# Patient Record
Sex: Female | Born: 1960 | Race: White | Hispanic: No | Smoking: Current some day smoker
Health system: Southern US, Community
[De-identification: ages and names within clinical notes are randomized; demographics above are authoritative.]

## PROBLEM LIST (undated history)

## (undated) DIAGNOSIS — T783XXA Angioneurotic edema, initial encounter: Secondary | ICD-10-CM

## (undated) DIAGNOSIS — L509 Urticaria, unspecified: Secondary | ICD-10-CM

## (undated) DIAGNOSIS — R45851 Suicidal ideations: Secondary | ICD-10-CM

## (undated) DIAGNOSIS — E538 Deficiency of other specified B group vitamins: Secondary | ICD-10-CM

## (undated) DIAGNOSIS — L309 Dermatitis, unspecified: Secondary | ICD-10-CM

## (undated) DIAGNOSIS — G5600 Carpal tunnel syndrome, unspecified upper limb: Secondary | ICD-10-CM

## (undated) DIAGNOSIS — H6980 Other specified disorders of Eustachian tube, unspecified ear: Secondary | ICD-10-CM

## (undated) DIAGNOSIS — F1020 Alcohol dependence, uncomplicated: Secondary | ICD-10-CM

## (undated) DIAGNOSIS — F988 Other specified behavioral and emotional disorders with onset usually occurring in childhood and adolescence: Secondary | ICD-10-CM

## (undated) DIAGNOSIS — Z872 Personal history of diseases of the skin and subcutaneous tissue: Secondary | ICD-10-CM

## (undated) DIAGNOSIS — L819 Disorder of pigmentation, unspecified: Secondary | ICD-10-CM

## (undated) DIAGNOSIS — M20012 Mallet finger of left finger(s): Secondary | ICD-10-CM

## (undated) HISTORY — DX: Urticaria, unspecified: L50.9

## (undated) HISTORY — DX: Deficiency of other specified B group vitamins: E53.8

## (undated) HISTORY — DX: Other specified behavioral and emotional disorders with onset usually occurring in childhood and adolescence: F98.8

## (undated) HISTORY — DX: Carpal tunnel syndrome, unspecified upper limb: G56.00

## (undated) HISTORY — DX: Suicidal ideations: R45.851

## (undated) HISTORY — PX: FOOT SURGERY: SHX648

## (undated) HISTORY — PX: RHINOPLASTY: SUR1284

## (undated) HISTORY — DX: Mallet finger of left finger(s): M20.012

## (undated) HISTORY — DX: Dermatitis, unspecified: L30.9

## (undated) HISTORY — DX: Personal history of diseases of the skin and subcutaneous tissue: Z87.2

## (undated) HISTORY — DX: Angioneurotic edema, initial encounter: T78.3XXA

## (undated) HISTORY — DX: Disorder of pigmentation, unspecified: L81.9

## (undated) HISTORY — DX: Other specified disorders of Eustachian tube, unspecified ear: H69.80

## (undated) HISTORY — PX: TONSILLECTOMY: SUR1361

---

## 1998-04-19 ENCOUNTER — Other Ambulatory Visit: Admission: RE | Admit: 1998-04-19 | Discharge: 1998-04-19 | Payer: Self-pay | Admitting: Obstetrics and Gynecology

## 1998-06-01 ENCOUNTER — Inpatient Hospital Stay (HOSPITAL_COMMUNITY): Admission: AD | Admit: 1998-06-01 | Discharge: 1998-06-01 | Payer: Self-pay | Admitting: *Deleted

## 1998-06-06 ENCOUNTER — Observation Stay (HOSPITAL_COMMUNITY): Admission: AD | Admit: 1998-06-06 | Discharge: 1998-06-07 | Payer: Self-pay | Admitting: *Deleted

## 1998-10-02 ENCOUNTER — Inpatient Hospital Stay (HOSPITAL_COMMUNITY): Admission: AD | Admit: 1998-10-02 | Discharge: 1998-10-04 | Payer: Self-pay | Admitting: Obstetrics and Gynecology

## 1998-10-03 ENCOUNTER — Encounter (HOSPITAL_COMMUNITY): Admission: RE | Admit: 1998-10-03 | Discharge: 1999-01-01 | Payer: Self-pay | Admitting: Obstetrics and Gynecology

## 1998-11-13 ENCOUNTER — Other Ambulatory Visit: Admission: RE | Admit: 1998-11-13 | Discharge: 1998-11-13 | Payer: Self-pay | Admitting: Obstetrics and Gynecology

## 1999-11-13 ENCOUNTER — Other Ambulatory Visit: Admission: RE | Admit: 1999-11-13 | Discharge: 1999-11-13 | Payer: Self-pay | Admitting: Obstetrics and Gynecology

## 2000-07-29 ENCOUNTER — Other Ambulatory Visit: Admission: RE | Admit: 2000-07-29 | Discharge: 2000-07-29 | Payer: Self-pay | Admitting: Obstetrics and Gynecology

## 2000-08-01 ENCOUNTER — Other Ambulatory Visit: Admission: RE | Admit: 2000-08-01 | Discharge: 2000-08-01 | Payer: Self-pay | Admitting: Obstetrics and Gynecology

## 2000-08-01 ENCOUNTER — Encounter (INDEPENDENT_AMBULATORY_CARE_PROVIDER_SITE_OTHER): Payer: Self-pay | Admitting: Specialist

## 2001-02-16 ENCOUNTER — Encounter (INDEPENDENT_AMBULATORY_CARE_PROVIDER_SITE_OTHER): Payer: Self-pay

## 2001-02-16 ENCOUNTER — Other Ambulatory Visit: Admission: RE | Admit: 2001-02-16 | Discharge: 2001-02-16 | Payer: Self-pay | Admitting: *Deleted

## 2002-12-13 ENCOUNTER — Encounter: Admission: RE | Admit: 2002-12-13 | Discharge: 2002-12-13 | Payer: Self-pay | Admitting: Internal Medicine

## 2002-12-13 ENCOUNTER — Encounter: Payer: Self-pay | Admitting: Internal Medicine

## 2004-04-19 ENCOUNTER — Encounter: Admission: RE | Admit: 2004-04-19 | Discharge: 2004-04-19 | Payer: Self-pay | Admitting: Obstetrics and Gynecology

## 2005-05-21 ENCOUNTER — Encounter: Admission: RE | Admit: 2005-05-21 | Discharge: 2005-05-21 | Payer: Self-pay | Admitting: Obstetrics and Gynecology

## 2006-05-22 ENCOUNTER — Encounter: Admission: RE | Admit: 2006-05-22 | Discharge: 2006-05-22 | Payer: Self-pay | Admitting: Obstetrics and Gynecology

## 2006-07-22 LAB — CONVERTED CEMR LAB: Pap Smear: NORMAL

## 2007-02-19 ENCOUNTER — Encounter: Payer: Self-pay | Admitting: Internal Medicine

## 2007-04-15 ENCOUNTER — Ambulatory Visit: Payer: Self-pay | Admitting: Internal Medicine

## 2007-04-15 DIAGNOSIS — Z872 Personal history of diseases of the skin and subcutaneous tissue: Secondary | ICD-10-CM | POA: Insufficient documentation

## 2007-04-15 DIAGNOSIS — E538 Deficiency of other specified B group vitamins: Secondary | ICD-10-CM | POA: Insufficient documentation

## 2007-04-15 HISTORY — DX: Personal history of diseases of the skin and subcutaneous tissue: Z87.2

## 2007-04-15 LAB — CONVERTED CEMR LAB
Bilirubin Urine: NEGATIVE
Ketones, urine, test strip: NEGATIVE
Nitrite: NEGATIVE
RBC / HPF: NONE SEEN (ref <3)
Specific Gravity, Urine: 1.01
Urobilinogen, UA: NEGATIVE
WBC, UA: NONE SEEN cells/hpf (ref <3)
pH: 6

## 2007-04-16 ENCOUNTER — Encounter: Payer: Self-pay | Admitting: Internal Medicine

## 2007-05-04 ENCOUNTER — Encounter: Payer: Self-pay | Admitting: Family Medicine

## 2007-05-05 ENCOUNTER — Encounter: Payer: Self-pay | Admitting: Internal Medicine

## 2007-05-28 ENCOUNTER — Encounter: Admission: RE | Admit: 2007-05-28 | Discharge: 2007-05-28 | Payer: Self-pay | Admitting: Obstetrics and Gynecology

## 2007-08-04 ENCOUNTER — Ambulatory Visit: Payer: Self-pay | Admitting: Internal Medicine

## 2007-08-06 ENCOUNTER — Telehealth (INDEPENDENT_AMBULATORY_CARE_PROVIDER_SITE_OTHER): Payer: Self-pay | Admitting: *Deleted

## 2007-08-10 LAB — CONVERTED CEMR LAB
HDL: 60.5 mg/dL (ref >39.0)
TSH: 3.59 microintl units/mL (ref 0.35–5.50)
VLDL: 9 mg/dL (ref 0–40)

## 2007-08-11 ENCOUNTER — Telehealth (INDEPENDENT_AMBULATORY_CARE_PROVIDER_SITE_OTHER): Payer: Self-pay | Admitting: *Deleted

## 2008-11-01 ENCOUNTER — Encounter: Admission: RE | Admit: 2008-11-01 | Discharge: 2008-11-01 | Payer: Self-pay | Admitting: Obstetrics and Gynecology

## 2009-09-19 LAB — CONVERTED CEMR LAB: Pap Smear: NORMAL

## 2009-11-07 ENCOUNTER — Encounter: Admission: RE | Admit: 2009-11-07 | Discharge: 2009-11-07 | Payer: Self-pay | Admitting: Obstetrics and Gynecology

## 2009-12-01 ENCOUNTER — Ambulatory Visit: Payer: Self-pay | Admitting: Internal Medicine

## 2009-12-01 DIAGNOSIS — G5600 Carpal tunnel syndrome, unspecified upper limb: Secondary | ICD-10-CM

## 2009-12-01 DIAGNOSIS — R109 Unspecified abdominal pain: Secondary | ICD-10-CM | POA: Insufficient documentation

## 2009-12-01 HISTORY — DX: Carpal tunnel syndrome, unspecified upper limb: G56.00

## 2009-12-01 LAB — CONVERTED CEMR LAB
Bilirubin Urine: NEGATIVE
Glucose, Urine, Semiquant: NEGATIVE
Ketones, urine, test strip: NEGATIVE
Urobilinogen, UA: 0.2
pH: 5

## 2009-12-13 ENCOUNTER — Ambulatory Visit: Payer: Self-pay | Admitting: Internal Medicine

## 2009-12-15 LAB — CONVERTED CEMR LAB
ALT: 18 units/L (ref 0–35)
Albumin: 4.1 g/dL (ref 3.5–5.2)
BUN: 9 mg/dL (ref 6–23)
Basophils Relative: 1.2 % (ref 0.0–3.0)
CO2: 27 meq/L (ref 19–32)
Calcium: 9.2 mg/dL (ref 8.4–10.5)
Cholesterol: 137 mg/dL (ref 0–200)
Creatinine, Ser: 0.7 mg/dL (ref 0.4–1.2)
Eosinophils Absolute: 0.3 10*3/uL (ref 0.0–0.7)
Eosinophils Relative: 6.4 % — ABNORMAL HIGH (ref 0.0–5.0)
Folate: 16 ng/mL
Glucose, Bld: 70 mg/dL (ref 70–99)
HDL: 63.6 mg/dL (ref >39.00)
Lymphocytes Relative: 37.3 % (ref 12.0–46.0)
MCHC: 34.1 g/dL (ref 30.0–36.0)
Neutrophils Relative %: 43 % (ref 43.0–77.0)
Platelets: 285 10*3/uL (ref 150.0–400.0)
RBC: 4.04 M/uL (ref 3.87–5.11)
Total Protein: 6.8 g/dL (ref 6.0–8.3)
Triglycerides: 48 mg/dL (ref 0.0–149.0)
Vitamin B-12: 702 pg/mL (ref 211–911)
WBC: 4.3 10*3/uL — ABNORMAL LOW (ref 4.5–10.5)

## 2010-08-12 ENCOUNTER — Encounter: Payer: Self-pay | Admitting: Internal Medicine

## 2010-08-16 ENCOUNTER — Ambulatory Visit
Admission: RE | Admit: 2010-08-16 | Discharge: 2010-08-16 | Payer: Self-pay | Source: Home / Self Care | Attending: Internal Medicine | Admitting: Internal Medicine

## 2010-08-16 ENCOUNTER — Other Ambulatory Visit: Payer: Self-pay | Admitting: Internal Medicine

## 2010-08-16 DIAGNOSIS — K921 Melena: Secondary | ICD-10-CM | POA: Insufficient documentation

## 2010-08-16 LAB — CONVERTED CEMR LAB
Blood in Urine, dipstick: NEGATIVE
Glucose, Urine, Semiquant: NEGATIVE
Nitrite: NEGATIVE
Protein, U semiquant: NEGATIVE
Specific Gravity, Urine: 1.01
pH: 6.5

## 2010-08-16 LAB — CBC WITH DIFFERENTIAL/PLATELET
Basophils Absolute: 0 10*3/uL (ref 0.0–0.1)
Basophils Relative: 0.6 % (ref 0.0–3.0)
Eosinophils Absolute: 0.2 10*3/uL (ref 0.0–0.7)
HCT: 38.4 % (ref 36.0–46.0)
Lymphocytes Relative: 38.7 % (ref 12.0–46.0)
MCHC: 34.2 g/dL (ref 30.0–36.0)
MCV: 95.7 fl (ref 78.0–100.0)
RBC: 4.01 Mil/uL (ref 3.87–5.11)
WBC: 6.3 10*3/uL (ref 4.5–10.5)

## 2010-08-17 ENCOUNTER — Encounter: Payer: Self-pay | Admitting: Gastroenterology

## 2010-08-17 ENCOUNTER — Encounter: Payer: Self-pay | Admitting: Internal Medicine

## 2010-08-17 LAB — CONVERTED CEMR LAB

## 2010-08-21 NOTE — Assessment & Plan Note (Signed)
Summary: CPX/KDC   Vital Signs:  Patient profile:   50 year old female Height:      66 inches Weight:      125.8 pounds BMI:     20.38 Pulse rate:   92 / minute BP sitting:   124 / 80  Vitals Entered By: Shary Decamp (Dec 01, 2009 1:26 PM) CC: cpx, not fasting Is Patient Diabetic? No   History of Present Illness: CPX also complaining of ill-defined lower abdominal discomfort, symptoms are not related to her menstrual periods or food intake  The discomfort is bilateral, lower, sometimes in the right flank and is going on for 2 years. She lost her mom to an abdominal cancer last year and is concerned.  they never found a  primary cancer of her mom Last gynecological examination  few  month ago, normal.  Review of systems Denies nausea, vomiting, diarrhea Occasionally he has a small amount of blood per rectum associated with pain, patient thinks is due  to an anal fissure No dysuria or hematuria.  Also almost 2 years ago she was seen at the ER and later on at  the neurology office with right hand weakness and numbness A number of tests were done including a nerve conduction study The neurologist told her that she has right carpal  tunnel syndrome and a B12 deficiency, she has been taking B12 supplements since At the present time she continued to have weakness in the right hand and occasional numbness mostly in the first and second finger.  Also from time to time the tip of her fingers gets really plae , usually with exposure to cold. No episode of redness or blushing of her fingers.   Preventive Screening-Counseling & Management  Alcohol-Tobacco     Alcohol type: beer, wine     Packs/Day: 1 pack/week  Caffeine-Diet-Exercise     Caffeine use/day: 1     Does Patient Exercise: no  Allergies: No Known Drug Allergies  Past History:  Past Medical History: G5 P3 miscarriage x 2 B12 def not on BC , was pregnat 2010, spontaneous loss   Past Surgical History: Reviewed  history from 04/15/2007 and no changes required. Tonsillectomy Rhinoplasty  Family History: MI: M at age 44 Family History of CAD Female 1st degree relative <60 no colon cancer or breast cancer two brothers have a h/o substance abuse M - deceased Ca?kind DM - 2 bro HTN - GM, bro  Social History: Reviewed history from 04/15/2007 and no changes required. 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 Resp:  Denies cough and shortness of breath. MS:  no neck pain. Psych:  ++  stress at work .  Physical Exam  General:  alert, well-developed, and well-nourished.   Neck:  no thyromegaly and normal carotid upstroke.   Lungs:  normal respiratory effort, no intercostal retractions, no accessory muscle use, and normal breath sounds.   Heart:  normal rate, regular rhythm, no murmur, and no gallop.   Abdomen:  soft, non-tender, no distention, no masses, no guarding, and no rigidity.  palpable aorta in the epigastrium, no bruit or tenderness Msk:  no upper extremity subcutaneous nodules Extremities:  no edema Inspection and palpation of the wrists and hands is normal, no  synovitis Good capillary refill bilaterally Neurologic:  opportunity reflexes symmetric Upper extremity motor intact Pinprick examination of the upper extremities is also normal   Impression & Recommendations:  Problem # 1:  HEALTH SCREENING (ICD-V70.0) Td today never had a Cscope  drinks daily, moderation recommended, counseled , call if needs help  tobacco-- counseled female cared by gynecology, last female exam 3-11 labs  Problem # 2:  CARPAL TUNNEL SYNDROME (ICD-354.0) 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 See HPI recommend to see  orthopedic surgery for reevaluation, if she has transient hand weakness, she may benefit from surgery addendum-- prefers to go back to the neurologist that saw her , she will make her appointment   Problem # 3:  ABDOMINAL PAIN (ICD-789.00)  ill-defined abdominal pain Labs to rule out anemia Ultrasound of the abdomen--pelvis Consider a colonoscopy Reassess in 4 months  Orders: UA Dipstick w/Micro (automated) (81001) Radiology Referral (Radiology)  Problem # 4:  change in the color of the fingertips: Observe   Problem # 5:  time spent in addicition to her CPX >> 15 min   Complete Medication List: 1)  B12   Patient Instructions: 1)  came  back fasting: 2)  FLP, BMP, LFTs, TSH ------ dx v70 3)  CBC ---- dx abdominal pain 4)  B2 of folic acid ----- dx  B12 deficiency 5)  Please schedule a follow-up appointment in 4 months .  6)  It is not healthy for a women to drink more then 1-2 drinks per day.    Risk Factors:  Tobacco use:  current    Cigarettes:  Yes -- 1 pack/week pack(s) per day Drug use:  no Caffeine use:  1 drinks per day Alcohol use:  yes    Type:  beer, wine    Drinks per day:  4+ Exercise:  no  Mammogram History:     Date of Last Mammogram:  10/20/2009    Results:  normal   PAP Smear History:     Date of Last PAP Smear:  09/19/2009    Results:  normal     Preventive Care Screening  Last Tetanus Booster:    Date:  12/01/2009    Results:  pt declined Td 12/01/09  Mammogram:    Date:  10/20/2009    Results:  normal   Pap Smear:    Date:  09/19/2009    Results:  normal      Laboratory Results   Urine Tests    Routine Urinalysis   Glucose: negative   (Normal Range: Negative) Bilirubin: negative   (Normal Range: Negative) Ketone: negative   (Normal Range: Negative) Spec. Gravity: 1.015   (Normal Range: 1.003-1.035) Blood: negative   (Normal Range: Negative) pH: 5.0   (Normal Range: 5.0-8.0) Protein: negative   (Normal Range:  Negative) Urobilinogen: 0.2   (Normal Range: 0-1) Nitrite: negative   (Normal Range: Negative) Leukocyte Esterace: negative   (Normal Range: Negative)

## 2010-08-23 NOTE — Assessment & Plan Note (Signed)
Summary: bleeding when having movements///sph   Vital Signs:  Patient profile:   50 year old female Height:      66 inches (167.64 cm) Weight:      129 pounds (58.64 kg) BMI:     20.90 Temp:     98.4 degrees F (36.89 degrees C) oral BP sitting:   110 / 76  (left arm) Cuff size:   regular  Vitals Entered By: Lucious Groves CMA (August 16, 2010 8:44 AM) CC: C/O bright red rectal bleeding with BM./kb Is Patient Diabetic? No Pain Assessment Patient in pain? no      Comments Patient notes that she has been having soft almost diarrhea like BM's, and abd pain. She denies any pain at this time, fever, weakness, dizziness, and constipation.   History of Present Illness: on and off history of blood per rectum, she frequently has red blood in the toilet paper and in the toilet; unable to say if the blood is mixed with the stools; during  September and December 2011, symptoms were much frecuent  but she is better now. She still has some anorectal irritation. She has a long history of frequent BMs however in the last few months they've been even  more frequent up to 5 times a day. Her stools are usually soft but they are even softer lately.  ROS She continued to have ill-defined lower abdominal pain for about 2 years, she was evaluated 11-2009, recommend an ultrasound but that never happened. Denies nausea, vomiting. No weight loss (she has gained 3 pounds in the last few months) No fever thinks she "may be getting a UTI", but denies actual dysuria or hematuria.      Current Medications (verified): 1)  B12 2)  Fish Oil .... Qd 3)  Mvi .... Qd 4)  Probiotic .... Once Daily  Allergies (verified): No Known Drug Allergies  Past History:  Past Medical History: Reviewed history from 12/01/2009 and no changes required. G5 P3 miscarriage x 2 B12 def not on BC , was pregnat 2010, spontaneous loss   Past Surgical History: Reviewed history from 04/15/2007 and no changes  required. Tonsillectomy Rhinoplasty  Social History: Reviewed history from 12/01/2009 and no changes required. 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   Eyes:  not pale or jaundice  Abdomen:  soft, no distention, no masses, no guarding, and no rigidity.  mild lower abdominal discomfort to palpation. No CVA tenderness Rectal:  No external abnormalities noted. Normal sphincter tone. No rectal masses or tenderness. stools Hemoccult negative anoscopy:several, small  Internal hemorrhoids, rectal mucosa slightly red. No mucus. Extremities:  no pretibial edema bilaterally  Psych:  Oriented X3, normally interactive, good eye contact, not anxious appearing, and not depressed appearing.     Impression & Recommendations:  Problem # 1:  ABDOMINAL PAIN (ICD-789.00) ongoing lower abdominal pain, frequent hematochezia, bowel movement pattern recently changed. Last labs showed no anemia. Plan: CBC UA GI referral, likely she will need a colonoscopy due to the above symptoms. CAT Scan?  Orders: Venipuncture ([address]) TLB-CBC Platelet - w/Differential (85025-CBCD) Specimen Handling (60454) T-Culture, Urine (09811-91478) UA Dipstick w/o Micro (manual) (29562) Gastroenterology Referral (GI)  Problem # 2:  HEMATOCHEZIA (ICD-578.1) Assessment: New  see #1  Orders: Gastroenterology Referral (GI) Anoscopy (13086)  Complete Medication List: 1)  B12  2)  Fish Oil  .Marland KitchenMarland KitchenMarland Kitchen  Qd 3)  Mvi  .... Qd 4)  Probiotic  .... Once daily  Patient Instructions: 1)  Please schedule a follow-up appointment in 4 months . physical exam   Orders Added: 1)  Venipuncture [36415] 2)  TLB-CBC Platelet - w/Differential [85025-CBCD] 3)  Specimen Handling [99000] 4)  T-Culture, Urine [[address]] 5)  UA Dipstick w/o Micro (manual)  [81002] 6)  Gastroenterology Referral [GI] 7)  Est. Patient Level IV [09811] 8)  Anoscopy [46600]    Laboratory Results   Urine Tests  Date/Time Received: Lucious Groves [facility]  August 16, 2010 9:10 AM  Date/Time Reported: Lucious Groves CMA  August 16, 2010 9:10 AM   Routine Urinalysis   Color: lt. yellow Appearance: Hazy Glucose: negative   (Normal Range: Negative) Bilirubin: negative   (Normal Range: Negative) Ketone: negative   (Normal Range: Negative) Spec. Gravity: 1.010   (Normal Range: 1.003-1.035) Blood: negative   (Normal Range: Negative) pH: 6.5   (Normal Range: 5.0-8.0) Protein: negative   (Normal Range: Negative) Urobilinogen: 0.2   (Normal Range: 0-1) Nitrite: negative   (Normal Range: Negative) Leukocyte Esterace: trace   (Normal Range: Negative)

## 2010-08-23 NOTE — Letter (Signed)
Summary: New Patient letter  [facility]  [address] [place], [place] [address]   Phone: [phone]  Fax: [phone]       08/17/2010 MRN: [id number]  Leah Jones [address] [place], [place]  [address]  Dear Leah Jones,  Welcome to the Gastroenterology Division at [facility].    You are scheduled to see Dr.  Arlyce Dice on 08-27-10 at 10am on the 3rd floor at [facility], [address]  We ask that you try to arrive at our office 15 minutes prior to your appointment time to allow for check-in.  We would like you to complete the enclosed self-administered evaluation form prior to your visit and bring it with you on the day of your appointment.  We will review it with you.  Also, please bring a complete list of all your medications or, if you prefer, bring the medication bottles and we will list them.  Please bring your insurance card so that we may make a copy of it.  If your insurance requires a referral to see a specialist, please bring your referral form from your primary care physician.  Co-payments are due at the time of your visit and may be paid by cash, check or credit card.     Your office visit will consist of a consult with your physician (includes a physical exam), any laboratory testing he/she may order, scheduling of any necessary diagnostic testing (e.g. x-ray, ultrasound, CT-scan), and scheduling of a procedure (e.g. Endoscopy, Colonoscopy) if required.  Please allow enough time on your schedule to allow for any/all of these possibilities.    If you cannot keep your appointment, please call [phone] to cancel or reschedule prior to your appointment date.  This allows [place] the opportunity to schedule an appointment for another patient in need of care.  If you do not cancel or reschedule by 5 p.m. the business day prior to your appointment date, you will be charged a $50.00 late cancellation/no-show fee.    Thank you for choosing [facility] for your medical needs.  We appreciate the opportunity to care for you.  Please visit [place] at our website  to learn more about our practice.                     Sincerely,                                                             The Gastroenterology Division

## 2010-08-27 ENCOUNTER — Encounter: Payer: Self-pay | Admitting: Gastroenterology

## 2010-08-27 ENCOUNTER — Encounter: Payer: Self-pay | Admitting: Internal Medicine

## 2010-08-27 ENCOUNTER — Ambulatory Visit (INDEPENDENT_AMBULATORY_CARE_PROVIDER_SITE_OTHER): Payer: Managed Care, Other (non HMO) | Admitting: Gastroenterology

## 2010-08-27 ENCOUNTER — Other Ambulatory Visit: Payer: Managed Care, Other (non HMO)

## 2010-08-27 ENCOUNTER — Other Ambulatory Visit: Payer: Self-pay | Admitting: Gastroenterology

## 2010-08-27 DIAGNOSIS — K602 Anal fissure, unspecified: Secondary | ICD-10-CM | POA: Insufficient documentation

## 2010-08-27 DIAGNOSIS — R109 Unspecified abdominal pain: Secondary | ICD-10-CM

## 2010-08-27 DIAGNOSIS — R1011 Right upper quadrant pain: Secondary | ICD-10-CM

## 2010-08-27 DIAGNOSIS — K625 Hemorrhage of anus and rectum: Secondary | ICD-10-CM

## 2010-08-27 LAB — HEPATIC FUNCTION PANEL
ALT: 19 U/L (ref 0–35)
AST: 21 U/L (ref 0–37)
Alkaline Phosphatase: 36 U/L — ABNORMAL LOW (ref 39–117)
Bilirubin, Direct: 0.1 mg/dL (ref 0.0–0.3)
Total Bilirubin: 0.7 mg/dL (ref 0.3–1.2)

## 2010-08-27 LAB — CONVERTED CEMR LAB: Tissue Transglutaminase Ab, IgA: 4.3 units (ref <20)

## 2010-09-03 ENCOUNTER — Other Ambulatory Visit (HOSPITAL_COMMUNITY): Payer: Managed Care, Other (non HMO)

## 2010-09-06 NOTE — Letter (Signed)
Summary: Results Letter  [facility]  [address] [place], [place] [address]   Phone: [phone]  Fax: [phone]        August 27, 2010 MRN: [id number]    POLLY BARNER [address] [place], [place]  [address]    Dear Ms. TRANSUE,  It is my pleasure to have treated you recently as a new patient in my office. I appreciate your confidence and the opportunity to participate in your care.  Since I do have a busy inpatient endoscopy schedule and office schedule, my office hours vary weekly. I am, however, available for emergency calls everyday through my office. If I am not available for an urgent office appointment, another one of our gastroenterologist will be able to assist you.  My well-trained staff are prepared to help you at all times. For emergencies after office hours, a physician from our Gastroenterology section is always available through my 24 hour answering service  Once again I welcome you as a new patient and I look forward to a happy and healthy relationship             Sincerely,  Louis Meckel MD  This letter has been electronically signed by your physician.  Appended Document: Results Letter letter mailed

## 2010-09-06 NOTE — Letter (Signed)
Summary: [facility] Instructions  [facility]  [address] [place], [place] [address]   Phone: [phone]  Fax: [phone]       MIIA BLANKS    03/31/1961    MRN: [id number]        Procedure Day /Date:THURSDAY 09/20/2010     Arrival Time:10AM     Procedure Time:11AM     Location of Procedure:                    X  [facility] (4th Floor)  PREPARATION FOR COLONOSCOPY WITH MOVIPREP   Starting 5 days prior to your procedure 09/15/2010 do not eat nuts, seeds, popcorn, corn, beans, peas,  salads, or any raw vegetables.  Do not take any fiber supplements (e.g. Metamucil, Citrucel, and Benefiber).  THE DAY BEFORE YOUR PROCEDURE         DATE:09/19/2010   DAY: WEDNESDAY  1.  Drink clear liquids the entire day-NO SOLID FOOD  2.  Do not drink anything colored red or purple.  Avoid juices with pulp.  No orange juice.  3.  Drink at least 64 oz. (8 glasses) of fluid/clear liquids during the day to prevent dehydration and help the prep work efficiently.  CLEAR LIQUIDS INCLUDE: Water Jello Ice Popsicles Tea (sugar ok, no milk/cream) Powdered fruit flavored drinks Coffee (sugar ok, no milk/cream) Gatorade Juice: apple, white grape, white cranberry  Lemonade Clear bullion, consomm, broth Carbonated beverages (any kind) Strained chicken noodle soup Hard Candy                             4.  In the morning, mix first dose of MoviPrep solution:    Empty 1 Pouch A and 1 Pouch B into the disposable container    Add lukewarm drinking water to the top line of the container. Mix to dissolve    Refrigerate (mixed solution should be used within 24 hrs)  5.  Begin drinking the prep at 5:00 p.m. The MoviPrep container is divided by 4 marks.   Every 15 minutes drink the solution down to the next mark (approximately 8 oz) until the full liter is complete.   6.  Follow completed prep with 16 oz of clear liquid of your choice (Nothing red or purple).  Continue to  drink clear liquids until bedtime.  7.  Before going to bed, mix second dose of MoviPrep solution:    Empty 1 Pouch A and 1 Pouch B into the disposable container    Add lukewarm drinking water to the top line of the container. Mix to dissolve    Refrigerate  THE DAY OF YOUR PROCEDURE      DATE: 09/20/2010  DAY: THURSDAY  Beginning at 6a.m. (5 hours before procedure):         1. Every 15 minutes, drink the solution down to the next mark (approx 8 oz) until the full liter is complete.  2. Follow completed prep with 16 oz. of clear liquid of your choice.    3. You may drink clear liquids until9AM  (2 HOURS BEFORE PROCEDURE).   MEDICATION INSTRUCTIONS  Unless otherwise instructed, you should take regular prescription medications with a small sip of water   as early as possible the morning of your procedure.        OTHER INSTRUCTIONS  You will need a responsible adult at least 48 years of age to accompany you and drive  you home.   This person must remain in the waiting room during your procedure.  Wear loose fitting clothing that is easily removed.  Leave jewelry and other valuables at home.  However, you may wish to bring a book to read or  an iPod/MP3 player to listen to music as you wait for your procedure to start.  Remove all body piercing jewelry and leave at home.  Total time from sign-in until discharge is approximately 2-3 hours.  You should go home directly after your procedure and rest.  You can resume normal activities the  day after your procedure.  The day of your procedure you should not:   Drive   Make legal decisions   Operate machinery   Drink alcohol   Return to work  You will receive specific instructions about eating, activities and medications before you leave.    The above instructions have been reviewed and explained to me by   _______________________    I fully understand and can verbalize these instructions  _____________________________ Date _________

## 2010-09-06 NOTE — Assessment & Plan Note (Addendum)
Summary: Abd pain and rectal bleeding    History of Present Illness Visit Type: Initial Consult Primary GI MD: Melvia Heaps MD [facility] Primary Provider: Willow Ora, MD Requesting Provider: Willow Ora, MD Chief Complaint: Lower abd pain, BRB after BMs and rectal pain  History of Present Illness:   Leah Jones is a pleasant 50 year old white female referred at the request of Dr. Drue Novel for evaluation of rectal bleeding.  She complains of bright red blood mixed with her stools and discoloring the toilet water.  She has a history of an anal fissure.   She normally moves her bowels 5-6 times a day.  Mother apparently had celiac disease.  No recent change in bowel habits.  She denies melena.  She is also complaining of occasional midepigastric and right upper quadrant discomfort.   GI Review of Systems    Reports abdominal pain and  bloating.     Location of  Abdominal pain: lower abdomen.    Denies acid reflux, belching, chest pain, dysphagia with liquids, dysphagia with solids, heartburn, loss of appetite, nausea, vomiting, vomiting blood, weight loss, and  weight gain.      Reports rectal bleeding and  rectal pain.     Denies anal fissure, black tarry stools, change in bowel habit, constipation, diarrhea, diverticulosis, fecal incontinence, heme positive stool, hemorrhoids, irritable bowel syndrome, jaundice, light color stool, and  liver problems.    Current Medications (verified): 1)  Fish Oil 1000 Mg Caps (Omega-3 Fatty Acids) .... One Capsule By Mouth Once Daily 2)  Multivitamins   Tabs (Multiple Vitamin) .... One Tablet By Mouth Once Daily 3)  Probiotic  Caps (Probiotic Product) .... One Capsule By Mouth Once Daily  Allergies (verified): No Known Drug Allergies  Past History:  Past Medical History: G5 P3 miscarriage x 2 not on BC , was pregnat 2010, spontaneous loss  ANAL FISSURE (ICD-565.0) HEMATOCHEZIA (ICD-578.1) ABDOMINAL PAIN (ICD-789.00) CARPAL TUNNEL SYNDROME (ICD-354.0) HEALTH  SCREENING (ICD-V70.0) ANAL FISSURE, HX OF (ICD-V13.3) FAMILY HISTORY OF CAD FEMALE 1ST DEGREE RELATIVE <60 (ICD-V16.49) B12 DEFICIENCY (ICD-266.2)  Past Surgical History: Reviewed history from 04/15/2007 and no changes required. Tonsillectomy Rhinoplasty  Family History: MI: M at age 19 Family History of CAD Female 1st degree relative <60 two brothers have a h/o substance abuse DM - 2 bro HTN - GM, bro Family History of Colon Cancer: ? Mother Family History of Breast Cancer:? 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  The patient denies allergy/sinus, anemia, anxiety-new, arthritis/joint pain, back pain, blood in urine, breast changes/lumps, change in vision, confusion, cough, coughing up blood, depression-new, fainting, fever, headaches-new, hearing problems, heart murmur, heart rhythm changes, itching, menstrual pain, muscle pains/cramps, nosebleeds, pregnancy symptoms, shortness of breath, skin rash, sleeping problems, sore throat, swelling of feet/legs, swollen lymph glands, thirst - excessive , urination - excessive , urination changes/pain, urine leakage, vision changes, and voice change.         All other systems were reviewed and were negative   Vital Signs:  Patient profile:   50 year old female Height:      66 inches Weight:  128 pounds BMI:     20.73 BSA:     1.66 Pulse rate:   88 / minute Pulse rhythm:   regular BP sitting:   120 / 64  (left arm) Cuff size:   regular  Vitals Entered By: Ok Anis CMA (August 27, 2010 10:12 AM)  Physical Exam  Additional Exam:  On physical exam she is well-developed well-nourished  female  skin: anicteric HEENT: normocephalic; PEERLA; no nasal or pharyngeal abnormalities neck: supple nodes: no cervical lymphadenopathy chest: clear to ausculatation and percussion heart: no murmurs, gallops, or rubs abd: soft, nontender; BS normoactive; no abdominal masses, tenderness, organomegaly rectal: no masses ext: no cynanosis, clubbing, edema skeletal: no deformities neuro: oriented x 3; no focal abnormalities    Impression & Recommendations:  Problem # 1:  HEMATOCHEZIA (ICD-578.1)  Limited red bleeding is most likely due to a fissure.  A more proximal colonic bleeding source should be ruled out.  Recommendations #1 colonoscopy  Orders: T-Sprue Panel (Celiac Disease Aby Eval) (83516x3/[address]) Colonoscopy (Colon)  Problem # 2:  ABDOMINAL PAIN (ICD-789.00)  Symptoms are nonspecific.  Recommendations #1 hyomax p.r.n. pain #2 abdominal ultrasound  Orders: T-Sprue Panel (Celiac Disease Aby Eval) (83516x3/[address]) TLB-Hepatic/Liver Function Pnl (80076-HEPATIC) Ultrasound Abdomen (UAS) Colonoscopy (Colon)  Patient Instructions: 1)  Copy sent to : Willow Ora, MD 2)  Your Abdominal U/S is scheduled on 09/03/2010 at 8:30am 3)  Your Colonoscopy is scheduled on 09/20/2010 at 11am 4)  You can pick up your MoviPrep from your pharmacy today 5)  Colonoscopy and Flexible Sigmoidoscopy brochure given.  6)  Conscious Sedation brochure given.  7)  Go to the basement today for your labs 8)  The medication list was reviewed and reconciled.  All changed / newly prescribed medications were explained.  A complete medication list was provided to the patient / caregiver. Prescriptions: MOVIPREP 100 GM  SOLR (PEG-KCL-NACL-NASULF-NA ASC-C) As per prep instructions.  #1 x 0   Entered by:   Merri Ray CMA (AAMA)   Authorized by:   Louis Meckel MD   Signed by:   Merri Ray CMA (AAMA) on 08/27/2010   Method used:   Electronically to        [facility]*  (retail)       [address]       [place], [place]  [address]       Ph: [phone]       Fax: [phone]   RxID:   [id number] HYOMAX-FT 0.125 MG TBDP (HYOSCYAMINE SULFATE) take 2 tabs sublingual q.4 h. p.r.n.  #15 x 1   Entered and Authorized by:   Louis Meckel MD   Signed by:   Louis Meckel MD on 08/27/2010   Method used:   Electronically to        [facility]* (retail)       [address]       [place], [place]  [address]       Ph: [phone]       Fax: [phone]   RxID:   [id number]

## 2010-09-07 ENCOUNTER — Encounter: Payer: Self-pay | Admitting: Gastroenterology

## 2010-09-07 ENCOUNTER — Ambulatory Visit (HOSPITAL_COMMUNITY)
Admission: RE | Admit: 2010-09-07 | Discharge: 2010-09-07 | Disposition: A | Discharge status: Home / Self Care | Payer: Managed Care, Other (non HMO) | Source: Ambulatory Visit | Attending: Gastroenterology | Admitting: Gastroenterology

## 2010-09-07 DIAGNOSIS — R109 Unspecified abdominal pain: Secondary | ICD-10-CM | POA: Insufficient documentation

## 2010-09-12 NOTE — Letter (Addendum)
Summary: Results Letter  [facility]  [address] [place], [place] [address]   Phone: [phone]  Fax: [phone]        September 07, 2010 MRN: [id number]    Leah Jones [address] [place], [place]  [address]    Dear [name],  Your ultrasound results did not show any remarkable findings.  Please continue with the recommendations previously discussed.  Should you have any further questions or immediate concers, feel free to contact me.  Sincerely,  Barbette Hair. Arlyce Dice, M.D., [facility]           Sincerely,  Louis Meckel MD  This letter has been electronically signed by your physician.  Appended Document: Results Letter Letter is mailed to the patient's home address

## 2010-09-20 ENCOUNTER — Other Ambulatory Visit: Payer: Managed Care, Other (non HMO) | Admitting: Gastroenterology

## 2010-10-05 ENCOUNTER — Telehealth: Payer: Self-pay | Admitting: Gastroenterology

## 2010-10-08 ENCOUNTER — Other Ambulatory Visit (AMBULATORY_SURGERY_CENTER): Payer: Managed Care, Other (non HMO) | Admitting: Gastroenterology

## 2010-10-08 ENCOUNTER — Other Ambulatory Visit: Payer: Self-pay | Admitting: Gastroenterology

## 2010-10-08 ENCOUNTER — Encounter: Payer: Self-pay | Admitting: Gastroenterology

## 2010-10-08 DIAGNOSIS — K625 Hemorrhage of anus and rectum: Secondary | ICD-10-CM

## 2010-10-08 DIAGNOSIS — R197 Diarrhea, unspecified: Secondary | ICD-10-CM

## 2010-10-09 NOTE — Progress Notes (Signed)
Summary: Questions about prep  Phone Note Call from Patient Call back at Home Phone [phone]   Caller: Patient Call For: Dr. Arlyce Dice Reason for Call: Talk to Nurse Summary of Call: Sch'd for COL on 10-08-10 and ate a small salad last night and has ate a handful of almonds every morning Initial call taken by: Karna Christmas,  October 05, 2010 1:47 PM  Follow-up for Phone Call        Spoke with patient and reviewed prep instructions with her, she is to not have any more salads or almond. Patient verbalized understanding. Follow-up by: Selinda Michaels RN,  October 05, 2010 2:11 PM

## 2010-10-10 ENCOUNTER — Telehealth: Payer: Self-pay | Admitting: Gastroenterology

## 2010-10-10 MED ORDER — HYDROCORTISONE ACETATE 25 MG RE SUPP: 25.0000 mg | RECTAL | Status: AC | PRN

## 2010-10-10 NOTE — Telephone Encounter (Signed)
Spoke with patient and let her know her follow-up appt date and time. Pt was to have Anusol HC supp to use PRN. Will call in script for pt to [facility].

## 2010-10-13 ENCOUNTER — Encounter: Payer: Self-pay | Admitting: Gastroenterology

## 2010-10-18 NOTE — Letter (Signed)
Summary: Appt Reminder 2  [facility]  [address] [place], [place] [address]   Phone: [phone]  Fax: [phone]        October 08, 2010 MRN: [id number]    Leah Jones [address] [place], [place]  [address]    Dear Ms. COLVILLE,   You have a return appointment with Dr. Arlyce Dice on 11/23/10 at 3pm.  Please remember to bring a complete list of the medicines you are taking, your insurance card and your co-pay.  If you have to cancel or reschedule this appointment, please call before 5:00 pm the evening before to avoid a cancellation fee.  If you have any questions or concerns, please call [phone].    Sincerely,    Selinda Michaels RN  Appended Document: Appt Reminder 2 Letter is mailed to the patient's home address

## 2010-10-18 NOTE — Procedures (Addendum)
Summary: Colonoscopy  Patient: Leah Jones Note: All result statuses are Final unless otherwise noted.  Tests: (1) Colonoscopy (COL)   COL Colonoscopy           DONE     [facility]     [address]     [place], [place]  [address]          COLONOSCOPY PROCEDURE REPORT          PATIENT:  Leah Jones, Leah Jones  MR#:  [id number]     BIRTHDATE:  May 11, 1961, 49 yrs. old  GENDER:  female          ENDOSCOPIST:  Barbette Hair. Arlyce Dice, MD     Referred by:  Willow Ora, M.D.          PROCEDURE DATE:  10/08/2010     PROCEDURE:  Colonoscopy with biopsy     ASA CLASS:  Class I     INDICATIONS:  1) rectal bleeding  2) unexplained diarrhea          MEDICATIONS:   Fentanyl 100 mcg IV, Versed 12 mg IV, Benadryl 25     mg IV          DESCRIPTION OF PROCEDURE:   After the risks benefits and     alternatives of the procedure were thoroughly explained, informed     consent was obtained.  Digital rectal exam was performed and     revealed no abnormalities.   The [id number] endoscope was     introduced through the anus and advanced to the cecum, which was     identified by the ileocecal valve, without limitations.  The     quality of the prep was excellent, using MoviPrep.  The instrument     was then slowly withdrawn as the colon was fully examined.     <<PROCEDUREIMAGES>>          FINDINGS:  A normal appearing cecum, ileocecal valve, and     appendiceal orifice were identified. The ascending, hepatic     flexure, transverse, splenic flexure, descending, sigmoid colon,     and rectum appeared unremarkable (see image1, image2, image3,     image5, image6, image8, and image9). Random biopsies were taken to     r/o microscopic colitis   Retroflexed views in the rectum revealed     no abnormalities.    The time to cecum =  11.0  minutes. The scope     was then withdrawn (time =  6.25  min) from the patient and the     procedure completed.          COMPLICATIONS:  None          ENDOSCOPIC  IMPRESSION:     1) Normal colon          Limited rectal bleeding secondary tohemorrhoids or anal fissure          RECOMMENDATIONS:     1) Await biopsy results     2) High fiber diet with liberal fluid intake.     3) Anusol HC supp. prn     4) call office next 1-3 days to schedule followup visit in 4-6     weeks          REPEAT EXAM:  10 years          ______________________________     Barbette Hair. Arlyce Dice, MD          CC:  n.     eSIGNED:   Barbette Hair. Kaplan at 10/08/2010 02:16 PM          Theresia Lo, [id number]  Note: An exclamation mark (!) indicates a result that was not dispersed into the flowsheet. Document Creation Date: 10/08/2010 2:17 PM _______________________________________________________________________  (1) Order result status: Final Collection or observation date-time: 10/08/2010 14:09 Requested date-time:  Receipt date-time:  Reported date-time:  Referring Physician:   Ordering Physician: Melvia Heaps [phone]) Specimen Source:  Source: Launa Grill Order Number: [id number] Lab site:

## 2010-10-22 ENCOUNTER — Other Ambulatory Visit: Payer: Self-pay | Admitting: Obstetrics and Gynecology

## 2010-10-22 DIAGNOSIS — Z1231 Encounter for screening mammogram for malignant neoplasm of breast: Secondary | ICD-10-CM

## 2010-11-13 ENCOUNTER — Ambulatory Visit: Payer: Managed Care, Other (non HMO)

## 2010-11-23 ENCOUNTER — Ambulatory Visit: Payer: Managed Care, Other (non HMO) | Admitting: Gastroenterology

## 2010-12-11 ENCOUNTER — Ambulatory Visit
Admission: RE | Admit: 2010-12-11 | Discharge: 2010-12-11 | Disposition: A | Discharge status: Home / Self Care | Payer: Managed Care, Other (non HMO) | Source: Ambulatory Visit | Attending: Obstetrics and Gynecology | Admitting: Obstetrics and Gynecology

## 2010-12-11 DIAGNOSIS — Z1231 Encounter for screening mammogram for malignant neoplasm of breast: Secondary | ICD-10-CM

## 2011-01-01 ENCOUNTER — Other Ambulatory Visit: Payer: Self-pay | Admitting: Obstetrics and Gynecology

## 2011-11-12 ENCOUNTER — Other Ambulatory Visit: Payer: Self-pay | Admitting: Obstetrics and Gynecology

## 2011-11-12 DIAGNOSIS — Z1231 Encounter for screening mammogram for malignant neoplasm of breast: Secondary | ICD-10-CM

## 2011-12-24 ENCOUNTER — Ambulatory Visit: Payer: Managed Care, Other (non HMO)

## 2012-01-07 ENCOUNTER — Ambulatory Visit
Admission: RE | Admit: 2012-01-07 | Discharge: 2012-01-07 | Disposition: A | Discharge status: Home / Self Care | Payer: Managed Care, Other (non HMO) | Source: Ambulatory Visit | Attending: Obstetrics and Gynecology | Admitting: Obstetrics and Gynecology

## 2012-01-07 DIAGNOSIS — Z1231 Encounter for screening mammogram for malignant neoplasm of breast: Secondary | ICD-10-CM

## 2012-01-17 ENCOUNTER — Ambulatory Visit (INDEPENDENT_AMBULATORY_CARE_PROVIDER_SITE_OTHER): Payer: Managed Care, Other (non HMO) | Admitting: Internal Medicine

## 2012-01-17 ENCOUNTER — Encounter: Payer: Self-pay | Admitting: Internal Medicine

## 2012-01-17 VITALS — BP 122/82 | HR 103 | Temp 98.7°F | Resp 16 | Wt 127.0 lb

## 2012-01-17 DIAGNOSIS — Z136 Encounter for screening for cardiovascular disorders: Secondary | ICD-10-CM

## 2012-01-17 DIAGNOSIS — J329 Chronic sinusitis, unspecified: Secondary | ICD-10-CM

## 2012-01-17 DIAGNOSIS — F988 Other specified behavioral and emotional disorders with onset usually occurring in childhood and adolescence: Secondary | ICD-10-CM | POA: Insufficient documentation

## 2012-01-17 MED ORDER — AMPHETAMINE-DEXTROAMPHETAMINE 10 MG PO TABS: 10.0000 mg | ORAL_TABLET | Freq: Two times a day (BID) | ORAL | Status: DC | PRN

## 2012-01-17 MED ORDER — AMOXICILLIN 500 MG PO CAPS: 1000.0000 mg | ORAL_CAPSULE | Freq: Two times a day (BID) | ORAL | Status: AC

## 2012-01-17 MED ORDER — FLUTICASONE PROPIONATE 50 MCG/ACT NA SUSP: 2.0000 | Freq: Every day | NASAL | Status: DC

## 2012-01-17 NOTE — Patient Instructions (Addendum)
Take adderall as needed, if you need a second dose, don't take it after 2 PM. Call me if side effects Check the  blood pressure 2 or 3 times a week, be sure it is between 110/60 and 140/85. If it is consistently higher or lower, let me know ---- start antibiotics and the nose spray for sinusitis. If you're not better in 10-12 days, please call your dentist

## 2012-01-17 NOTE — Progress Notes (Signed)
  Subjective:    Patient ID: Leah Jones, female    DOB: Apr 04, 1961, 51 y.o.   MRN: [id number]  HPI Here with 2 issues For a long time she suspected she had ADD, lately her symptoms are worse, unable to finish tasks, easily distracted. She denies depression but is anxious just because she can't accomplish much. She has a strong family history of ADD, see below. She also has a family history of heart disease and substance abuse but she never had any such problems herself. She smokes socially and drinks a glass of wine most nights. She self try Adderall from her son ant it worked extremely well without apparent side effects.   Also, about 3 weeks ago she had a root canal (upper R) she does not have any teeth pain per se but c/o  right sinus congestion and discomfort, which she applies pressure to the sinus area she hurts even at the teeth that had a root canal on.  Past Medical History: G5 P3 miscarriage x 2 B12 def not on BC , was pregnat 2010, spontaneous loss   Past Surgical History: Tonsillectomy Rhinoplasty  Social History: Married, 3 kids works full time-- sells truck for [employer] tobacco-- 1pack/week ETOH-- 2 servings a day  Family History: MI: M at age 63 Family History of CAD Female 1st degree relative <60 no colon cancer or breast cancer two brothers have a h/o substance abuse M - deceased Ca?kind DM - 2 bro HTN - GM, bro ADD-- 2 children, 4 nephews   Review of Systems No fever chills, mild runny nose, postnasal dripping and sore throat. Occasional blowing clear discharge from the nose. No cough or chest congestion. She is on no birth control, periods are regular although lately her bleeding has increased and she feels more crampy in the abdomen. No hot flashes.     Objective:   Physical Exam General -- alert, well-developed, and well-nourished.   Neck --no thyromegaly  HEENT -- TMs normal, throat w/o redness, face symmetric, nose not congested, slightly tender  at the right maxillary area; percussion of the teeth cost no tenderness Lungs -- normal respiratory effort, no intercostal retractions, no accessory muscle use, and normal breath sounds.   Heart-- normal rate, regular rhythm, no murmur, and no gallop.   Neurologic-- alert & oriented X3 and strength normal in all extremities. Psych-- Cognition and judgment appear intact. Alert and cooperative with normal attention span and concentration.  not anxious appearing and not depressed appearing.      Assessment & Plan:  Time spent w/ the pt assessing 2 new problems > 42 minutes

## 2012-01-17 NOTE — Assessment & Plan Note (Addendum)
See HPI, sx d/t sinusitis versus referred pain from her teeth. Teeth is not hurting to percussion. Will treat for sinusitis, see instructions.

## 2012-01-17 NOTE — Assessment & Plan Note (Signed)
The patient took a questionary to assess her ADD and she definitely has the diagnosis. She already try Adderall and works well for her. I discussed the pros and cons of Adderall, specifically she has a family history of heart disease and substance abuse but no problems of her own. Also she she does not take any birth control pills and knows Adderall cannot be taken if pregnant. EKG today NSR Plan: Adderall 10 mg twice a day PRN Recheck in 2 months. Patient to discontinue medication if side effects, chest pain or increase in blood pressure. See instructions.

## 2012-01-21 ENCOUNTER — Telehealth: Payer: Self-pay | Admitting: *Deleted

## 2012-01-21 NOTE — Telephone Encounter (Signed)
Prior Auth approved 12-22-11 until 01-20-13, pharmacy notified via fax and Pt via telephone call.

## 2012-02-14 ENCOUNTER — Other Ambulatory Visit: Payer: Self-pay | Admitting: *Deleted

## 2012-02-14 DIAGNOSIS — F988 Other specified behavioral and emotional disorders with onset usually occurring in childhood and adolescence: Secondary | ICD-10-CM

## 2012-02-14 MED ORDER — AMPHETAMINE-DEXTROAMPHETAMINE 20 MG PO TABS: 20.0000 mg | ORAL_TABLET | Freq: Two times a day (BID) | ORAL | Status: DC

## 2012-02-14 MED ORDER — AMPHETAMINE-DEXTROAMPHETAMINE 10 MG PO TABS: 10.0000 mg | ORAL_TABLET | Freq: Two times a day (BID) | ORAL | Status: DC | PRN

## 2012-02-14 NOTE — Telephone Encounter (Signed)
I guess she is requesting a refill of Adderall (not vyvanse). Okay to Rx Adderall 20 mg one by mouth twice a day when necessary, #60, no refills

## 2012-02-14 NOTE — Telephone Encounter (Signed)
Refill done, pt notified rx ready for pickup

## 2012-02-14 NOTE — Telephone Encounter (Signed)
Refill request for vyvanse Last filled-01/17/12 Last seen-01/17/12 Follow up -2 months Pt is taking 20 mg BID.  Pt has titrated up 10->15->20.  Pt states she will run out of meds first of next week.  Please advise refills.

## 2012-02-14 NOTE — Addendum Note (Signed)
Addended by: Edwena Felty T on: 02/14/2012 04:35 PM   Modules accepted: Orders

## 2012-03-24 ENCOUNTER — Other Ambulatory Visit: Payer: Self-pay | Admitting: *Deleted

## 2012-03-24 MED ORDER — AMPHETAMINE-DEXTROAMPHETAMINE 20 MG PO TABS: 20.0000 mg | ORAL_TABLET | Freq: Two times a day (BID) | ORAL | Status: DC

## 2012-03-24 NOTE — Telephone Encounter (Signed)
Left Pt detail message, Rx ready for pick up. 

## 2012-04-11 IMAGING — US US ABDOMEN COMPLETE
1 series · 13 of 25 positions shown · non-contrast
Comparison: None.

CLINICAL DATA: Right-sided abdominal pain.

COMPLETE ABDOMINAL ULTRASOUND 09/07/2010:

[Series 1: us abdomen complete · 0.21mm/px · 13 of 62 slices shown]
[im 1/62]
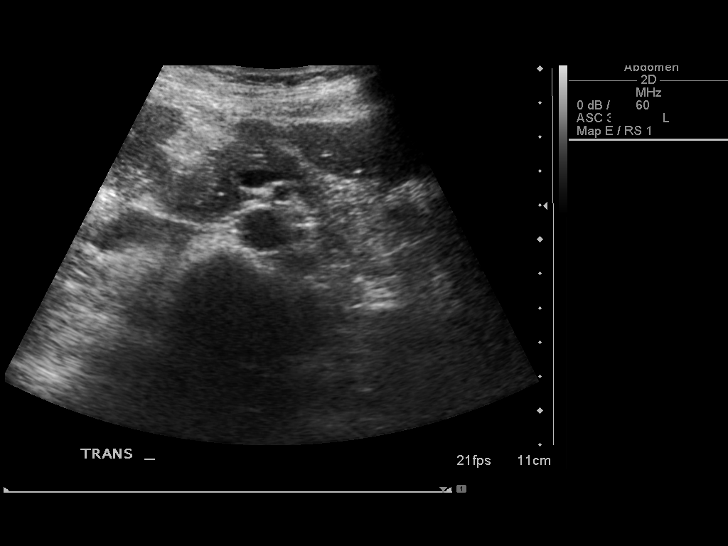
[im 6/62]
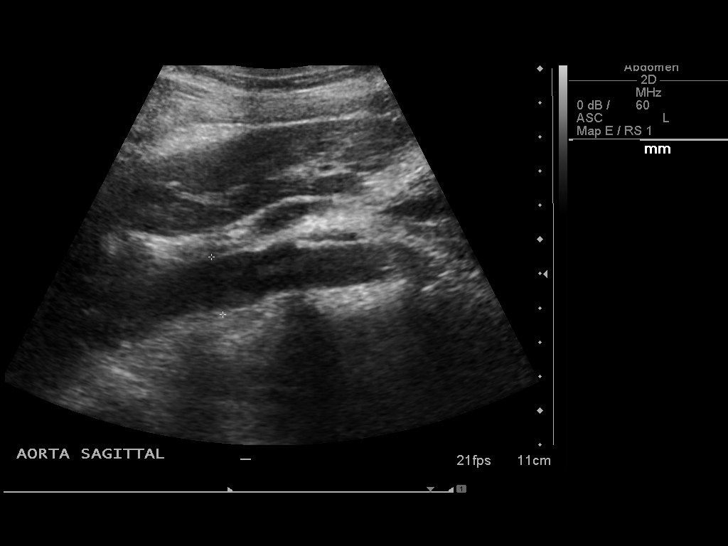
[im 11/62]
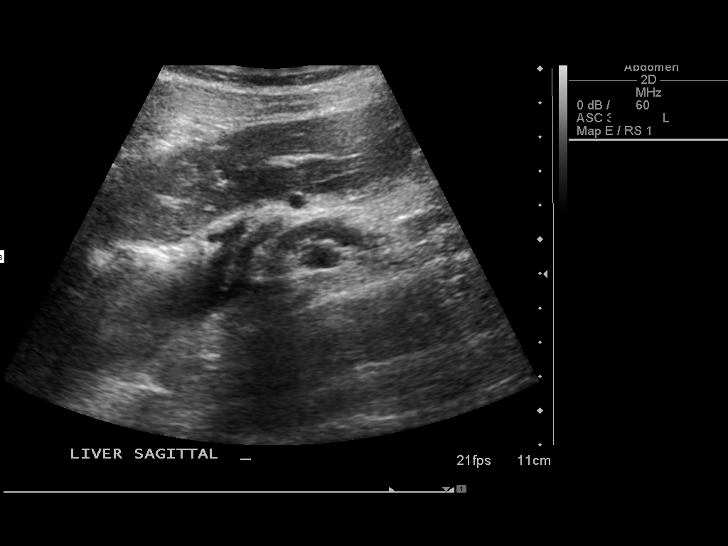
[im 16/62]
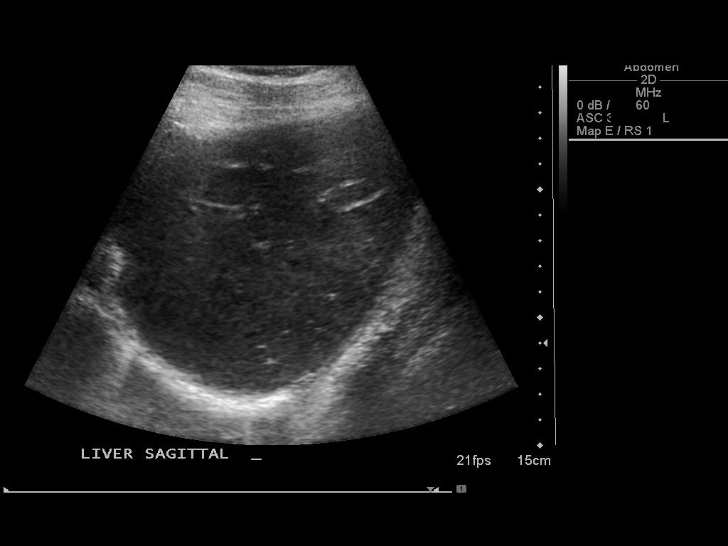
[im 21/62]
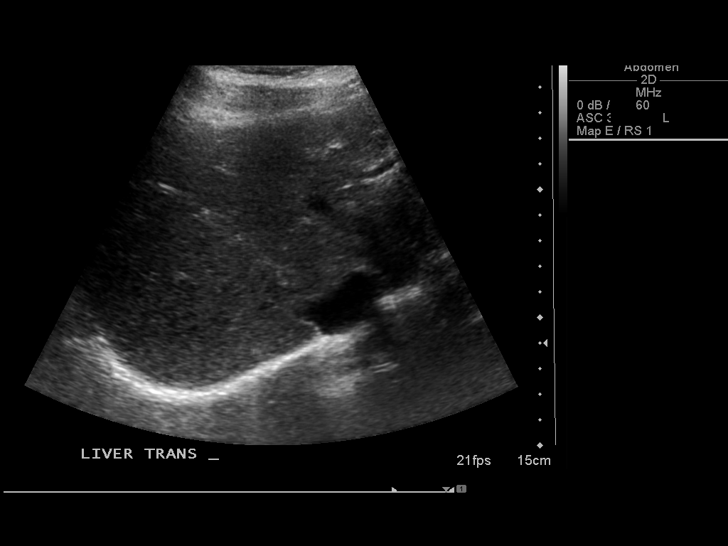
[im 26/62]
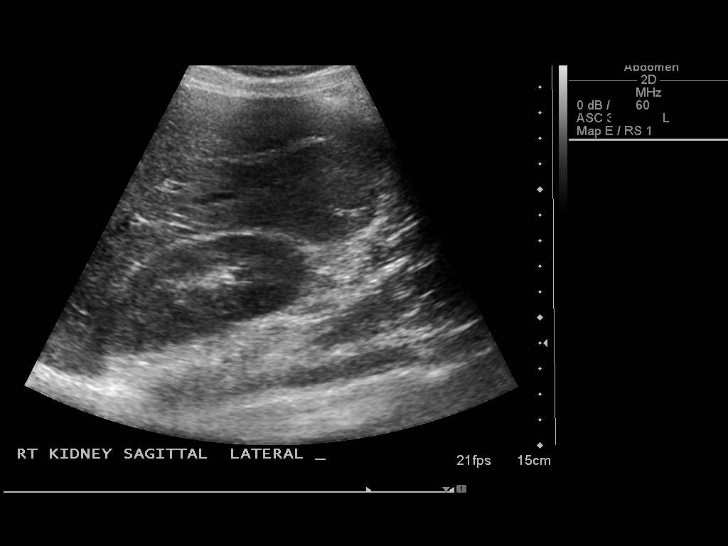
[im 31/62]
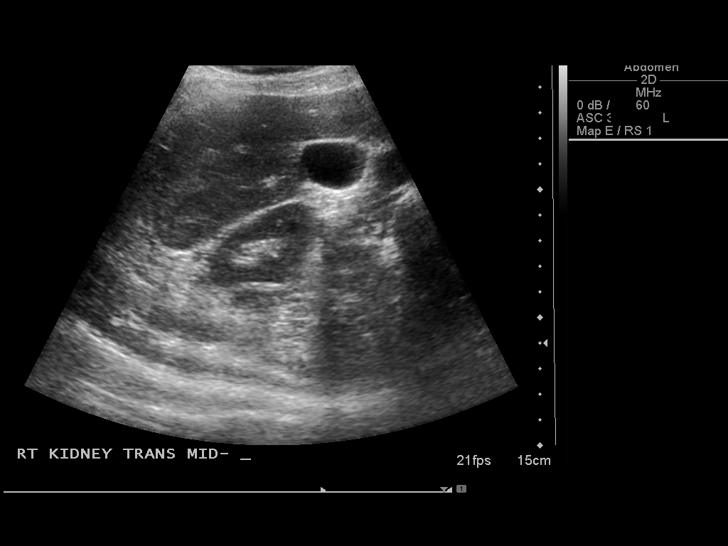
[im 36/62]
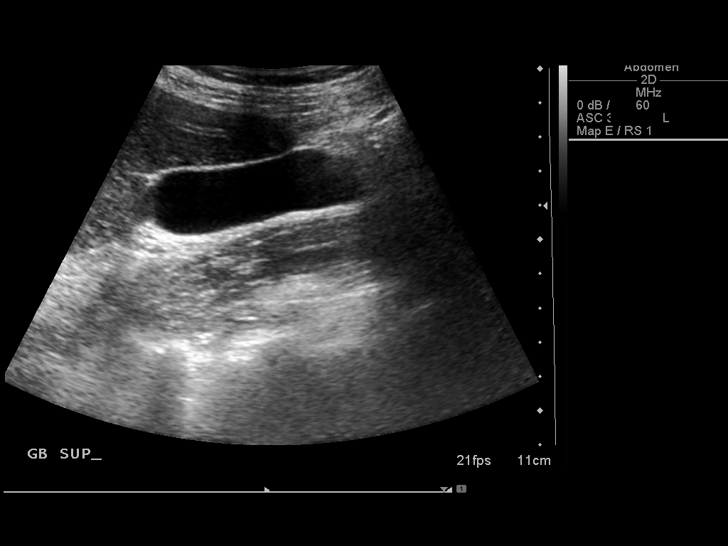
[im 41/62]
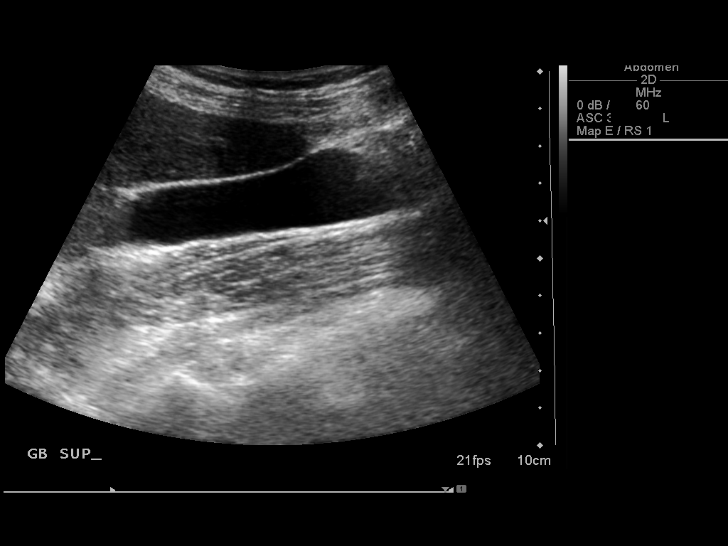
[im 46/62]
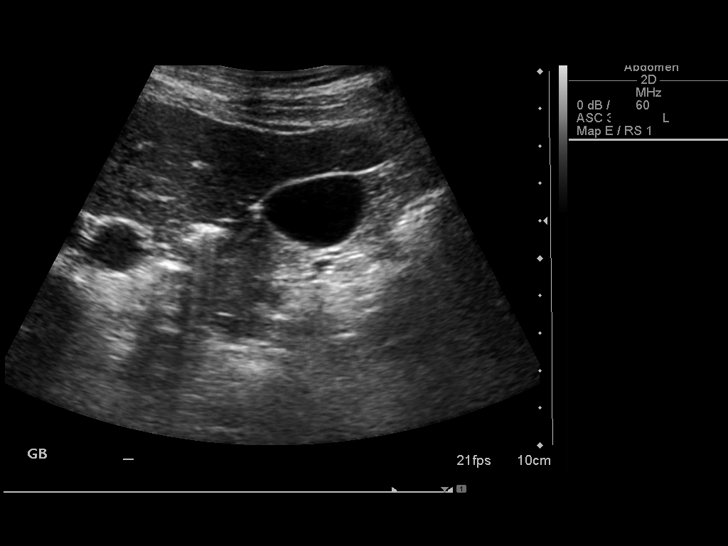
[im 51/62]
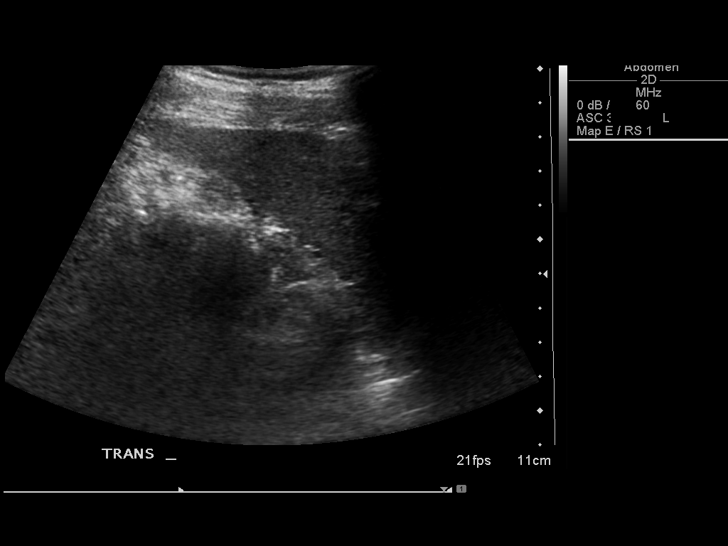
[im 56/62]
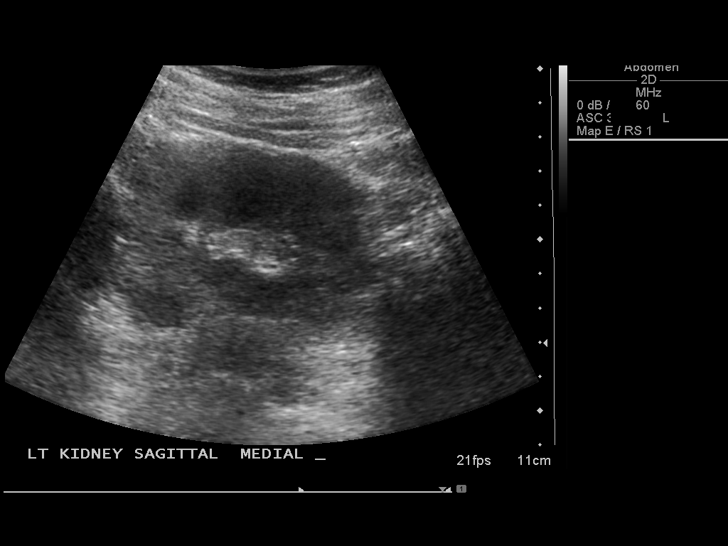
[im 62/62]
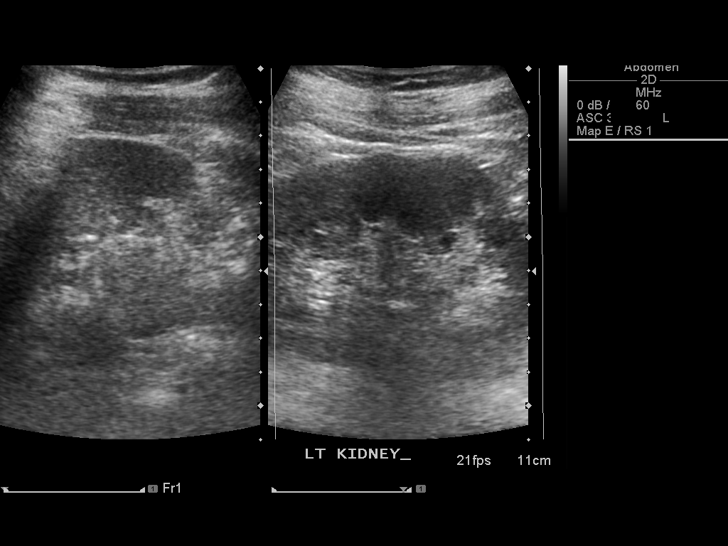

[13 of 25 positions shown; findings below may reference images not displayed]

FINDINGS: Gallbladder:  No shadowing gallstones or echogenic sludge.  No
gallbladder wall thickening or pericholecystic fluid.  Negative
sonographic Murphy's sign according to the ultrasound technologist.

Common bile duct:  Normal in caliber with maximum diameter
approximating 3 mm.

Liver:  Normal size and echotexture without focal parenchymal
abnormality.  Patent portal vein with hepatopetal flow.

IVC:  Patent.

Pancreas:  Although the pancreas is difficult to visualize in its
entirety, no focal pancreatic abnormality is identified.

Spleen:  Normal size and echotexture without focal parenchymal
abnormality.

Right Kidney:  No hydronephrosis.  Well-preserved cortex.  No
shadowing calculi.  Normal size and parenchymal echotexture without
focal abnormalities. Approximately 11.4 cm length.

Left Kidney:  No hydronephrosis.  Well-preserved cortex.  No
shadowing calculi.  Normal size and parenchymal echotexture without
focal abnormalities. Approximately 11.0 cm length.

Abdominal aorta:  Normal in caliber throughout its visualized
course in the abdomen.
IMPRESSION: Normal abdominal ultrasound.

## 2012-04-24 ENCOUNTER — Other Ambulatory Visit: Payer: Self-pay

## 2012-04-24 MED ORDER — AMPHETAMINE-DEXTROAMPHETAMINE 20 MG PO TABS: 20.0000 mg | ORAL_TABLET | Freq: Two times a day (BID) | ORAL | Status: DC

## 2012-04-24 NOTE — Telephone Encounter (Signed)
done

## 2012-04-24 NOTE — Telephone Encounter (Signed)
Last filled 03/24/12 #60 no refill last OV 01/17/12. Need okay     Plz Advise        MW

## 2012-05-11 ENCOUNTER — Ambulatory Visit (INDEPENDENT_AMBULATORY_CARE_PROVIDER_SITE_OTHER): Payer: Managed Care, Other (non HMO) | Admitting: Internal Medicine

## 2012-05-11 ENCOUNTER — Encounter: Payer: Self-pay | Admitting: Internal Medicine

## 2012-05-11 VITALS — BP 116/70 | HR 81 | Temp 98.2°F | Wt 124.0 lb

## 2012-05-11 DIAGNOSIS — H6691 Otitis media, unspecified, right ear: Secondary | ICD-10-CM

## 2012-05-11 DIAGNOSIS — H6690 Otitis media, unspecified, unspecified ear: Secondary | ICD-10-CM

## 2012-05-11 MED ORDER — NEOMYCIN-POLYMYXIN-HC 3.5-10000-1 OT SUSP: 3.0000 [drp] | Freq: Four times a day (QID) | OTIC | Status: DC

## 2012-05-11 MED ORDER — FLUTICASONE PROPIONATE 50 MCG/ACT NA SUSP: NASAL | Status: DC

## 2012-05-11 MED ORDER — AMOXICILLIN-POT CLAVULANATE 875-125 MG PO TABS
1.0000 | ORAL_TABLET | Freq: Two times a day (BID) | ORAL | Status: DC
Start: 2012-05-11 — End: 2012-09-01

## 2012-05-11 NOTE — Progress Notes (Signed)
  Subjective:    Patient ID: Leah Jones, female    DOB: June 10, 1961, 51 y.o.   MRN: [id number]  HPI She's had a sore throat for last 2-3 weeks and some pressure and discomfort in & below the R ear. This morning @ approximately 4 AM she was awakened by severe pain in the right ear. She also had ear infection 6 months ago for which she took antibiotics.  She has been using Sudafed.  She denies associated tinnitus, hearing loss, vertigo  This a.m. she awoke with some crusting of the eyelids  She has had right facial pain but denies frontal headache and nasal purulence. She does describe postnasal drainage and a globus sensation in the throat area.    Review of Systems She denies associated dyspnea or wheezing.She denies associated extrinsic symptoms of itchy, watery eyes, sneezing    Objective:   Physical Exam   General appearance:thin but in good health ;well nourished; no acute distress or increased work of breathing is present.  No  lymphadenopathy about the head, neck, or axilla noted.   Eyes: No conjunctival inflammation or lid edema is present. EOMI & vison grossly intact. No nystagmus present  Ears:  External ear exam shows no significant lesions or deformities.  Severe erythema of the right tympanic membrane with dramatically reduced light reflex . Nose:  External nasal examination shows no deformity or inflammation. Nasal mucosa are pink and moist without lesions or exudates. No septal dislocation or deviation.No obstruction to airflow.   Oral exam: Dental hygiene is good; lips and gums are healthy appearing.There is no oropharyngeal erythema or exudate noted.   Neck:  No deformities, thyromegaly, masses, or tenderness noted.   Supple with full range of motion without pain.   Heart:  Normal rate and regular rhythm. S1 and S2 normal without gallop, murmur, click, rub or other extra sounds.   Lungs:Chest clear to auscultation; no wheezes, rhonchi,rales ,or rubs present.No  increased work of breathing.    Extremities:  No cyanosis, edema, or clubbing  noted    Skin: Warm & dry         Assessment & Plan:  #1 otitis media, acute; possibly recurrent  Plan: See orders and recommendations

## 2012-05-11 NOTE — Patient Instructions (Addendum)
Plain Mucinex for thick secretions ;force NON dairy fluids . STOP Sudafed.Use a Neti pot daily as needed for sinus congestion; going from open side to congested side . Nasal cleansing in the shower as discussed. Make sure that all residual soap is removed to prevent irritation. Fluticasone 1 spray in each nostril twice a day as needed. Use the "crossover" technique as discussed. Plain Allegra 160 daily as needed for itchy eyes & sneezing.

## 2012-05-13 ENCOUNTER — Telehealth: Payer: Self-pay

## 2012-05-13 NOTE — Telephone Encounter (Signed)
Pt called left a message on triage line during lunch stating she couldn't remember the instructions giving at OV and could she take Mucinex. I called pt back to advise per OV:   Patient Instructions     Plain Mucinex for thick secretions ;force NON dairy fluids . STOP Sudafed.Use a Neti pot daily as needed for sinus congestion; going from open side to congested side . Nasal cleansing in the shower as discussed. Make sure that all residual soap is removed to prevent irritation. Fluticasone 1 spray in each nostril twice a day as needed. Use the "crossover" technique as discussed. Plain Allegra 160 daily as needed for itchy eyes & sneezing.   Pt stated understanding.    MW

## 2012-05-25 ENCOUNTER — Other Ambulatory Visit: Payer: Self-pay | Admitting: Internal Medicine

## 2012-05-25 ENCOUNTER — Other Ambulatory Visit: Payer: Self-pay

## 2012-05-25 DIAGNOSIS — H6690 Otitis media, unspecified, unspecified ear: Secondary | ICD-10-CM

## 2012-05-25 NOTE — Telephone Encounter (Signed)
Discussed with pt. The pt just saw Dr. Alwyn Ren for this same issue & Dr. Alwyn Ren has referred her to an ENT. The pt is going to wait to see then. The pt would also like a refill on Adderall 20mg . OK to refill?

## 2012-05-25 NOTE — Telephone Encounter (Signed)
I spoke with patient, patient states she is still with pain in her right ear and now some in her left, also drainage and frequent throat clearing. Patient completed ABX and followed all recommendations. Patient questions if she should have a referral to ENT or be seen again.

## 2012-05-25 NOTE — Telephone Encounter (Signed)
Message copied by Maurice Small on Mon May 25, 2012  1:22 PM ------      Message from: [place], [place]      Created: Mon May 25, 2012  1:01 PM       Pt called stating OV 05/11/12 was with Hop but her PCP is Drue Novel had questions concerning OV with [facility]. Plz call pt        CB: [phone]

## 2012-05-25 NOTE — Telephone Encounter (Signed)
I could see her tomorrow afternoon. Please arrange

## 2012-05-25 NOTE — Telephone Encounter (Signed)
OV 05/11/12. Adderall last filled 04/24/12 #60 no refills   Plz advise      MW

## 2012-05-26 ENCOUNTER — Other Ambulatory Visit: Payer: Self-pay | Admitting: Internal Medicine

## 2012-05-26 MED ORDER — AMPHETAMINE-DEXTROAMPHETAMINE 20 MG PO TABS: 20.0000 mg | ORAL_TABLET | Freq: Two times a day (BID) | ORAL | Status: DC

## 2012-05-26 NOTE — Telephone Encounter (Signed)
Ok to refill 

## 2012-05-26 NOTE — Telephone Encounter (Signed)
Pt called left message on triage line checking on Adderall Rx. I called pt back LMOVM advising Rx ready for pick up and our office hours.     MW

## 2012-05-26 NOTE — Telephone Encounter (Signed)
Adderall refilled.  Patient plans to see ENT, if she needs a referral ---> please arrange

## 2012-05-26 NOTE — Telephone Encounter (Signed)
She has significant findings on exam: She is not improved with Augmentin for 10 days and now she's having symptoms in the contralateral ear. That's why I had recommended ENT evaluation. Please verify her Adderall has been refilled;I'm  trying to get this off my desk top. Thanks, [employer]

## 2012-05-26 NOTE — Telephone Encounter (Signed)
Caller: [name]/Patient; Patient Name: Leah Jones; PCP: Willow Ora; Best Callback Phone Number: [phone] Is wanting to know if script is ready for pick up for Adderall. Advised per EPIC, script was written 11-5 and should be able to pick up in office.

## 2012-06-16 ENCOUNTER — Other Ambulatory Visit: Payer: Self-pay

## 2012-06-16 NOTE — Telephone Encounter (Signed)
OV 05/11/12 last filled 05/26/12 # 60 no refills  PLz advise    MW

## 2012-06-17 MED ORDER — AMPHETAMINE-DEXTROAMPHETAMINE 20 MG PO TABS: 20.0000 mg | ORAL_TABLET | Freq: Two times a day (BID) | ORAL | Status: DC

## 2012-06-17 NOTE — Telephone Encounter (Signed)
RF done today, okay to refill one more time. Ask patient to schedule an office visit in 2 months from today

## 2012-07-17 ENCOUNTER — Encounter: Payer: Self-pay | Admitting: *Deleted

## 2012-07-17 ENCOUNTER — Other Ambulatory Visit: Payer: Self-pay | Admitting: *Deleted

## 2012-07-17 MED ORDER — AMPHETAMINE-DEXTROAMPHETAMINE 20 MG PO TABS: 20.0000 mg | ORAL_TABLET | Freq: Two times a day (BID) | ORAL | Status: DC

## 2012-07-17 NOTE — Telephone Encounter (Signed)
Pt aware Rx ready for pickup and to sign agreement. 

## 2012-08-11 ENCOUNTER — Encounter: Payer: Self-pay | Admitting: Internal Medicine

## 2012-08-20 ENCOUNTER — Other Ambulatory Visit: Payer: Self-pay | Admitting: *Deleted

## 2012-08-20 MED ORDER — AMPHETAMINE-DEXTROAMPHETAMINE 20 MG PO TABS: 20.0000 mg | ORAL_TABLET | Freq: Two times a day (BID) | ORAL | Status: DC

## 2012-08-20 NOTE — Telephone Encounter (Signed)
Pt aware Rx will be ready for pick up in AM and that 6 month F/U appt is due will schedule at pick up.

## 2012-09-01 ENCOUNTER — Ambulatory Visit (INDEPENDENT_AMBULATORY_CARE_PROVIDER_SITE_OTHER): Payer: Managed Care, Other (non HMO) | Admitting: Internal Medicine

## 2012-09-01 VITALS — BP 140/84 | HR 94 | Temp 97.9°F | Wt 121.0 lb

## 2012-09-01 DIAGNOSIS — F988 Other specified behavioral and emotional disorders with onset usually occurring in childhood and adolescence: Secondary | ICD-10-CM

## 2012-09-01 MED ORDER — AMPHETAMINE-DEXTROAMPHET ER 10 MG PO CP24: 10.0000 mg | ORAL_CAPSULE | Freq: Every day | ORAL | Status: DC | PRN

## 2012-09-01 MED ORDER — AMPHETAMINE-DEXTROAMPHETAMINE 10 MG PO TABS: 10.0000 mg | ORAL_TABLET | Freq: Every day | ORAL | Status: DC

## 2012-09-01 MED ORDER — METHYLPHENIDATE HCL ER (OSM) 36 MG PO TBCR: 36.0000 mg | EXTENDED_RELEASE_TABLET | ORAL | Status: DC

## 2012-09-01 NOTE — Progress Notes (Signed)
  Subjective:    Patient ID: Leah Jones, female    DOB: 1960/09/18, 52 y.o.   MRN: [id number]  HPI Here to discuss ADD treatment. Currently taking Adderall 20 mg twice a day, it was working very good but in the last few weeks it is "wearing off at the end of the day" and she gets less focus and comfortable. She try somebody else's Concerta one time and worked well. She tried vyvanse and didn't help at all. Likes to try Concerta.  Past Medical History  Diagnosis Date  . ADD (attention deficit disorder)   . Carpal tunnel syndrome   . B12 deficiency    Past Surgical History  Procedure Laterality Date  . Tonsillectomy    . Rhinoplasty     History   Social History  . Marital Status: Married    Spouse Name: N/A    Number of Children: 3  . Years of Education: N/A   Occupational History  . [employer]     Social History Main Topics  . Smoking status: Current Every Day Smoker -- 1.00 packs/day    Types: Cigarettes  . Smokeless tobacco: Never Used  . Alcohol Use: Yes     Comment: socially  . Drug Use: No  . Sexually Active: Not on file   Other Topics Concern  . Not on file   Social History Narrative   G5 P3, 3 miscarriages    Family History: MI: M at age 33 Family History of CAD Female 1st degree relative <60 no colon cancer or breast cancer two brothers have a h/o substance abuse M - deceased Ca?kind DM - 2 bro HTN - GM, bro ADD-- 2 children, 4 nephews   Review of Systems Denies depression, occasionally gets moody and irritable particularly when Adderall is wearing off. Hardly ever has insomnia. BP today slightly elevated, usually it is normal. Denies CP  or shortness of breath, has a mild headache.     Objective:   Physical Exam  General -- alert, well-developed  Neurologic-- alert & oriented X3 , speech gait and motor are intact. Psych-- Cognition and judgment appear intact. Alert and cooperative with normal attention span and concentration.  not anxious  appearing and not depressed appearing.       Assessment & Plan:

## 2012-09-01 NOTE — Assessment & Plan Note (Addendum)
See history of present illness, Adderall not working as well as it did before. She try somebody else's Concerta and vyvanse, Concerta worked and she would like to try it. We agreed to try Concerta, she likes to keep a small amount of Adderall to be used mostly for Sundays when she just need to focus for few hours. See instructions. Also, BP is a slightly higher than usual today, recommend to check at least once a week, if it is out of range, she is to let me know (110/60 to 140/85) Next visit 4 months

## 2012-09-01 NOTE — Patient Instructions (Signed)
Try Concerta 1 tablet a day as needed. The days you don't take  Concerta, okay to take Adderall 1 tablet daily as needed

## 2012-09-03 ENCOUNTER — Encounter: Payer: Self-pay | Admitting: Internal Medicine

## 2012-09-08 ENCOUNTER — Telehealth: Payer: Self-pay | Admitting: *Deleted

## 2012-09-08 NOTE — Telephone Encounter (Signed)
Prior auth requested for Concerta CR 36mg .  Prior auth initiated, form received, completed & faxed back.

## 2012-09-08 NOTE — Telephone Encounter (Signed)
Prior Berkley Harvey has been approved.  Pt & pharmacy made aware.

## 2012-10-30 ENCOUNTER — Telehealth: Payer: Self-pay | Admitting: *Deleted

## 2012-10-30 NOTE — Telephone Encounter (Signed)
At  the last office visit we agreed to try Concerta daily and Adderall Sundays like Sunday. If that strategy is not working, she needs to see a specialist. Please refer to Dr. Jennelle Human

## 2012-10-30 NOTE — Telephone Encounter (Signed)
Patient left message on triage stating that changes made to ADD meds are not working well. She is currently taking Concerta 36mg   and Adderrall 10mg , this was a change from her previous regime of Adderall 20mg  BID. During her OV on 09/01/12 she stated that she felt it was not working well any longer and that she had tried a friends concerta and felt like that worked. Does she need to make another appt to discuss meds with you?

## 2012-11-02 ENCOUNTER — Telehealth: Payer: Self-pay | Admitting: Internal Medicine

## 2012-11-02 ENCOUNTER — Telehealth: Payer: Self-pay | Admitting: *Deleted

## 2012-11-02 DIAGNOSIS — F988 Other specified behavioral and emotional disorders with onset usually occurring in childhood and adolescence: Secondary | ICD-10-CM

## 2012-11-02 MED ORDER — AMPHETAMINE-DEXTROAMPHETAMINE 10 MG PO TABS: 10.0000 mg | ORAL_TABLET | Freq: Every day | ORAL | Status: DC | PRN

## 2012-11-02 NOTE — Telephone Encounter (Signed)
See previous phone note.  

## 2012-11-02 NOTE — Telephone Encounter (Signed)
Patient is completely out of her amphetamine-dextroamphetamine (ADDERALL).  In reference to her psychiatric referral to Dr. Jennelle Human, patient is contacting his office to schedule an appointment if he takes her insurance.  If not, I have given patient other psychiatric practices to choose from.  Please call patient when Rx is ready for pick up.

## 2012-11-02 NOTE — Telephone Encounter (Signed)
Referral to Dr. Meredith Staggers placed

## 2012-11-02 NOTE — Telephone Encounter (Signed)
done

## 2012-11-02 NOTE — Telephone Encounter (Signed)
Pt made aware rx is at front desk ready to be picked up.  

## 2012-11-02 NOTE — Telephone Encounter (Signed)
Ok to refill? Please advise.  

## 2012-11-11 ENCOUNTER — Telehealth: Payer: Self-pay | Admitting: Internal Medicine

## 2012-11-11 DIAGNOSIS — F988 Other specified behavioral and emotional disorders with onset usually occurring in childhood and adolescence: Secondary | ICD-10-CM

## 2012-11-11 NOTE — Telephone Encounter (Signed)
A referral to psychiatry was entered on 11/02/12.  Patient states she did contact Dr. Alwyn Ren office, and 2 other practices, but has decided she is not going to a psychiatrist.  Patient states she had a bad experience in the past, and feels they just prescribe medicine and increase it if it doesn't work, and that they really don't care about the patient.  Patient is now requesting a new referral to Neuropsychologist, Dr. Norval Morton at [facility], ph [phone].  States her sister sees this doctor, really likes him, and he prescribes medications.  Will you enter new referral?

## 2012-11-12 NOTE — Telephone Encounter (Signed)
Done

## 2012-11-18 ENCOUNTER — Other Ambulatory Visit: Payer: Self-pay | Admitting: Internal Medicine

## 2012-11-20 NOTE — Telephone Encounter (Signed)
Pt called stating that she has spoke with Corrie Dandy at our office regarding this referral. However pt stated that she needs a refill on her ADD medication while she is waiting for the referral to be completed. Please advise this medication was shown to have been filled on 4/14 330 with 0 refills.

## 2012-11-23 NOTE — Telephone Encounter (Signed)
She got #30 11-02-2012, too eraly

## 2012-11-23 NOTE — Telephone Encounter (Signed)
Pt left VM that she spoke with Shanda Bumps earlier and she advise her that she just got med filled however she did get med fill but it was the wrong strength so she needs to have med fill again now. Pt request a call back to discuss this further. .Left message to call office

## 2012-11-23 NOTE — Telephone Encounter (Signed)
Noted. Pt notified.  

## 2012-11-24 ENCOUNTER — Other Ambulatory Visit: Payer: Self-pay | Admitting: General Practice

## 2012-11-24 MED ORDER — AMPHETAMINE-DEXTROAMPHETAMINE 20 MG PO TABS: 20.0000 mg | ORAL_TABLET | Freq: Two times a day (BID) | ORAL | Status: DC

## 2012-11-24 NOTE — Telephone Encounter (Signed)
Pt states she is not taking the concerta due to the side effects, did not tell the office she was not taking the medication. States she would prefer to be back on the Adderal 20mg  bid. Med filled #60 with 0 refills.

## 2012-11-24 NOTE — Telephone Encounter (Signed)
Pt called back stating that her Adderal was filled on 11/02/12 as the wrong strength. Based on the phone notes and OV around that time pt had requested to use concerta and adderal 10mg  together. Pt was suppose to take concerta daily and adderal only on sundays. Records show concerta was last filled on 09/04/12. Pt has also changed the provider for referral three times based on our records. Please advise.

## 2012-11-24 NOTE — Telephone Encounter (Signed)
Called pt back regarding her Adderal. Need to know is pt using concerta? Does she only use Adderal on Sundays or everyday? If everyday Drue Novel states we are switching pt back to her old Adderal dose of 2 20mg  tablets bid.

## 2012-11-27 ENCOUNTER — Other Ambulatory Visit: Payer: Self-pay | Admitting: Obstetrics and Gynecology

## 2012-11-27 DIAGNOSIS — Z1231 Encounter for screening mammogram for malignant neoplasm of breast: Secondary | ICD-10-CM

## 2012-12-16 ENCOUNTER — Inpatient Hospital Stay (HOSPITAL_BASED_OUTPATIENT_CLINIC_OR_DEPARTMENT_OTHER): Admission: RE | Admit: 2012-12-16 | Payer: Managed Care, Other (non HMO) | Source: Ambulatory Visit

## 2012-12-30 ENCOUNTER — Telehealth: Payer: Self-pay | Admitting: *Deleted

## 2012-12-30 MED ORDER — AMPHETAMINE-DEXTROAMPHETAMINE 20 MG PO TABS: 20.0000 mg | ORAL_TABLET | Freq: Two times a day (BID) | ORAL | Status: DC

## 2012-12-30 NOTE — Telephone Encounter (Signed)
Low risk UDS 06-2012 Done

## 2012-12-30 NOTE — Telephone Encounter (Signed)
Last OV 09-01-12, last filled 11-24-12 #60

## 2012-12-31 NOTE — Telephone Encounter (Signed)
Pt made aware rx is ready to be picked up at front desk.  

## 2013-01-18 ENCOUNTER — Ambulatory Visit (HOSPITAL_BASED_OUTPATIENT_CLINIC_OR_DEPARTMENT_OTHER)
Admission: RE | Admit: 2013-01-18 | Discharge: 2013-01-18 | Disposition: A | Discharge status: Home / Self Care | Payer: Managed Care, Other (non HMO) | Source: Ambulatory Visit | Attending: Obstetrics and Gynecology | Admitting: Obstetrics and Gynecology

## 2013-01-18 DIAGNOSIS — Z1231 Encounter for screening mammogram for malignant neoplasm of breast: Secondary | ICD-10-CM | POA: Insufficient documentation

## 2013-03-24 ENCOUNTER — Ambulatory Visit (INDEPENDENT_AMBULATORY_CARE_PROVIDER_SITE_OTHER): Payer: Managed Care, Other (non HMO) | Admitting: Nurse Practitioner

## 2013-03-24 ENCOUNTER — Encounter: Payer: Self-pay | Admitting: Nurse Practitioner

## 2013-03-24 ENCOUNTER — Telehealth: Payer: Self-pay | Admitting: Internal Medicine

## 2013-03-24 VITALS — BP 130/90 | HR 101 | Temp 98.2°F | Wt 121.5 lb

## 2013-03-24 DIAGNOSIS — K2970 Gastritis, unspecified, without bleeding: Secondary | ICD-10-CM

## 2013-03-24 DIAGNOSIS — R109 Unspecified abdominal pain: Secondary | ICD-10-CM

## 2013-03-24 LAB — HEPATIC FUNCTION PANEL
ALT: 19 U/L (ref 0–35)
AST: 23 U/L (ref 0–37)
Albumin: 4.2 g/dL (ref 3.5–5.2)
Alkaline Phosphatase: 39 U/L (ref 39–117)

## 2013-03-24 LAB — POCT URINALYSIS DIPSTICK
Blood, UA: NEGATIVE
Glucose, UA: NEGATIVE
Ketones, UA: NEGATIVE
Spec Grav, UA: 1.015
Urobilinogen, UA: 0.2

## 2013-03-24 LAB — CBC
HCT: 39 % (ref 36.0–46.0)
Hemoglobin: 13.3 g/dL (ref 12.0–15.0)
RBC: 4.14 Mil/uL (ref 3.87–5.11)
WBC: 7.2 10*3/uL (ref 4.5–10.5)

## 2013-03-24 LAB — RENAL FUNCTION PANEL
Albumin: 4.2 g/dL (ref 3.5–5.2)
BUN: 8 mg/dL (ref 6–23)
Calcium: 9.1 mg/dL (ref 8.4–10.5)
Chloride: 102 mEq/L (ref 96–112)
Glucose, Bld: 84 mg/dL (ref 70–99)
Phosphorus: 3.2 mg/dL (ref 2.3–4.6)

## 2013-03-24 LAB — AMYLASE: Amylase: 45 U/L (ref 27–131)

## 2013-03-24 MED ORDER — OMEPRAZOLE 40 MG PO CPDR: 40.0000 mg | DELAYED_RELEASE_CAPSULE | Freq: Every day | ORAL | Status: DC

## 2013-03-24 NOTE — Telephone Encounter (Signed)
Patient Information:  Caller Name: Piera  Phone: [phone]  Patient: Theresia Lo  Gender: Female  DOB: 11-Nov-1960  Age: 52 Years  PCP: Willow Ora  Pregnant: No  Office Follow Up:  Does the office need to follow up with this patient?: No  Instructions For The Office: N/A  RN Note:  Last BM 03/24/13. Did not see color of emesis due to vomited in black trash bag at work. No appointments remain at [facility] office.  Scheduled for 1130 03/24/13 with Molly Maduro, NP at [facility].    Symptoms  Reason For Call & Symptoms: Emergent Call: Severe subumbilical abdominal pain.  Onset 03/24/13 at 10:10.  Afebrile.  Pain came on suddenly with nausea; emesis X 1, then became diaphoretic.  Pain rated 6/10 now but was 8/10 at onset.  Also had similiar but milder abdominal pain for 30 minutes yesterday, 03/23/13.  Reviewed Health History In EMR: Yes  Reviewed Medications In EMR: Yes  Reviewed Allergies In EMR: Yes  Reviewed Surgeries / Procedures: Yes  Date of Onset of Symptoms: 03/24/2013  Treatments Tried: Lying down on abdomen, ice to head  Treatments Tried Worked: Yes OB / GYN:  LMP: 03/03/2013  Guideline(s) Used:  Abdominal Pain - Female  Disposition Per Guideline:   See Today in Office  Reason For Disposition Reached:   Moderate or mild pain that comes and goes (cramps) lasts > 24 hours  Advice Given:  Call Back If:  You become worse.  Patient Will Follow Care Advice:  YES

## 2013-03-24 NOTE — Progress Notes (Signed)
  Subjective:    Patient ID: Leah Jones, female    DOB: 1960-11-21, 52 y.o.   MRN: [id number]  Abdominal Pain This is a new problem. The current episode started in the past 7 days (had sore thraot and mild abd pain 3 days ago, sore throat resolved, adb pain recurred today). The onset quality is sudden (today had sudden onset abd pain accompanied by sweating, N&V times 1). The problem occurs intermittently (1 mild episode 3 da, 1 intense episode today). The most recent episode lasted 1 hour. The problem has been gradually improving. The pain is located in the epigastric region and suprapubic region. The pain is severe. The quality of the pain is colicky, cramping and sharp. The abdominal pain radiates to the periumbilical region (started L side radiated across to right). Associated symptoms include nausea and vomiting. Pertinent negatives include no constipation, diarrhea (loose stools, no change from normal), dysuria, fever (had chills earlier), frequency or headaches. Nothing aggravates the pain. The pain is relieved by nothing. She has tried nothing for the symptoms. anal fissure, diverticulitis      Review of Systems  Constitutional: Positive for chills. Negative for fever (had chills earlier) and activity change.  HENT: Positive for sore throat (resolved). Negative for congestion, trouble swallowing and neck stiffness.   Respiratory: Negative for cough.   Cardiovascular: Negative for chest pain.  Gastrointestinal: Positive for nausea, vomiting and abdominal pain. Negative for diarrhea (loose stools, no change from normal), constipation and abdominal distention.  Genitourinary: Negative for dysuria and frequency.  Musculoskeletal: Negative for back pain.  Skin: Negative for rash.  Neurological: Negative for headaches.  Psychiatric/Behavioral:       Takes adderall-did not take today, started on ativan 2 mos ago, took 1 this morning        Objective:   Physical Exam  Vitals  reviewed. Constitutional: She is oriented to person, place, and time. She appears well-developed. No distress.  thin  HENT:  Head: Normocephalic and atraumatic.  Mouth/Throat: Oropharynx is clear and moist. No oropharyngeal exudate.  Eyes: Conjunctivae are normal. Right eye exhibits no discharge. Left eye exhibits no discharge.  Neck: Normal range of motion. Neck supple. No tracheal deviation present. No thyromegaly present.  Cardiovascular: Normal rate.   Pulmonary/Chest: Effort normal.  Abdominal: Soft. She exhibits no distension and no mass. There is tenderness (epigastric tenderness, suprapubic tenderness). There is no rebound and no guarding.  Genitourinary:  Ketones in urine, eats high protein/low carb diet  Musculoskeletal:  Normal gait  Lymphadenopathy:    She has no cervical adenopathy.  Neurological: She is alert and oriented to person, place, and time.  Skin: Skin is warm and dry.  Psychiatric: She has a normal mood and affect. Her behavior is normal. Thought content normal.          Assessment & Plan:  Abdominal pain, unspecified site - Plan: CBC, Hepatic function panel, Amylase, Lipase, Renal function panel, omeprazole (PRILOSEC) 40 MG capsule  Gastritis - Plan: omeprazole (PRILOSEC) 40 MG capsule Abstain from alcohol. See pt instructions.

## 2013-03-24 NOTE — Telephone Encounter (Signed)
Noted. Encounter closed due to pt having an appt.  

## 2013-03-24 NOTE — Addendum Note (Signed)
Addended by: Baldemar Lenis R on: 03/24/2013 04:36 PM   Modules accepted: Orders

## 2013-03-24 NOTE — Patient Instructions (Signed)
Many things could cause your symptoms. I will check liver, pancreas, kidney & blood count today.  If this is a virus, you will probably have some diarrhea, but will feel better in 4-5 days.  Your symptoms may be alcohol induced gastritis. If so, abstain from alcoholic beverages. Start omeprazole 40 mg daily for 8 weeks, then cut back to 20 mg daily. Schedule appt. W/Dr. Drue Novel for re-eval in 8-10 weeks or sooner if your symptoms do not improve within 2 weeks or get worse.  Gastritis, Adult Gastritis is soreness and swelling (inflammation) of the lining of the stomach. Gastritis can develop as a sudden onset (acute) or long-term (chronic) condition. If gastritis is not treated, it can lead to stomach bleeding and ulcers. CAUSES  Gastritis occurs when the stomach lining is weak or damaged. Digestive juices from the stomach then inflame the weakened stomach lining. The stomach lining may be weak or damaged due to viral or bacterial infections. One common bacterial infection is the Helicobacter pylori infection. Gastritis can also result from excessive alcohol consumption, taking certain medicines, or having too much acid in the stomach.  SYMPTOMS  In some cases, there are no symptoms. When symptoms are present, they may include:  Pain or a burning sensation in the upper abdomen.  Nausea.  Vomiting.  An uncomfortable feeling of fullness after eating. DIAGNOSIS  Your caregiver may suspect you have gastritis based on your symptoms and a physical exam. To determine the cause of your gastritis, your caregiver may perform the following:  Blood or stool tests to check for the H pylori bacterium.  Gastroscopy. A thin, flexible tube (endoscope) is passed down the esophagus and into the stomach. The endoscope has a light and camera on the end. Your caregiver uses the endoscope to view the inside of the stomach.  Taking a tissue sample (biopsy) from the stomach to examine under a microscope. TREATMENT    Depending on the cause of your gastritis, medicines may be prescribed. If you have a bacterial infection, such as an H pylori infection, antibiotics may be given. If your gastritis is caused by too much acid in the stomach, H2 blockers or antacids may be given. Your caregiver may recommend that you stop taking aspirin, ibuprofen, or other nonsteroidal anti-inflammatory drugs (NSAIDs). HOME CARE INSTRUCTIONS  Only take over-the-counter or prescription medicines as directed by your caregiver.  If you were given antibiotic medicines, take them as directed. Finish them even if you start to feel better.  Drink enough fluids to keep your urine clear or pale yellow.  Avoid foods and drinks that make your symptoms worse, such as:  Caffeine or alcoholic drinks.  Chocolate.  Peppermint or mint flavorings.  Garlic and onions.  Spicy foods.  Citrus fruits, such as oranges, lemons, or limes.  Tomato-based foods such as sauce, chili, salsa, and pizza.  Fried and fatty foods.  Eat small, frequent meals instead of large meals. SEEK IMMEDIATE MEDICAL CARE IF:   You have black or dark red stools.  You vomit blood or material that looks like coffee grounds.  You are unable to keep fluids down.  Your abdominal pain gets worse.  You have a fever.  You do not feel better after 1 week.  You have any other questions or concerns. MAKE SURE YOU:  Understand these instructions.  Will watch your condition.  Will get help right away if you are not doing well or get worse. Document Released: 07/02/2001 Document Revised: 01/07/2012 Document Reviewed: 08/21/2011 ExitCare  Patient Information 2014 [place], [place].

## 2013-03-25 ENCOUNTER — Telehealth: Payer: Self-pay | Admitting: Nurse Practitioner

## 2013-03-25 LAB — POCT URINE PREGNANCY: Preg Test, Ur: NEGATIVE

## 2013-03-25 NOTE — Addendum Note (Signed)
Addended by: Baldemar Lenis R on: 03/25/2013 11:20 AM   Modules accepted: Orders

## 2013-03-25 NOTE — Telephone Encounter (Signed)
Kid & liv func norm, electrolytes norm Amylase & lipase norm Cbc norm ua norm Symptoms are likely viral, food poisoning, gastritis, colitis Attempted to contact pt to see how she is feeling & discuss labs. LM.

## 2013-03-25 NOTE — Telephone Encounter (Signed)
Please ask Kristi in lab to run urine preg if able.

## 2013-03-25 NOTE — Telephone Encounter (Signed)
LMO cell VM per patient's request, letting patient know that her pregnancy test was neg.

## 2013-03-25 NOTE — Telephone Encounter (Signed)
Patient returned call and was given results. Patient inquired if a pregnancy test had been run for her? She just wants to rule this possibility out. Please advise?

## 2013-03-27 ENCOUNTER — Emergency Department (HOSPITAL_COMMUNITY)
Admission: EM | Admit: 2013-03-27 | Discharge: 2013-03-27 | Discharge status: Against Medical Advice | Payer: Managed Care, Other (non HMO)

## 2013-05-19 ENCOUNTER — Telehealth: Payer: Self-pay | Admitting: Internal Medicine

## 2013-05-19 NOTE — Telephone Encounter (Signed)
Patient states that on las week she dropped off a form from her insurance company to be completed. She is calling to check the status. Please advise.

## 2013-05-19 NOTE — Telephone Encounter (Signed)
Leah Jones, I believe I took care of that , could you check

## 2013-05-24 ENCOUNTER — Telehealth: Payer: Self-pay | Admitting: *Deleted

## 2013-05-24 NOTE — Telephone Encounter (Signed)
Received wellness form to complete but in reviewing the pts EMR, it appears that she has not had a CPE or the fasting labs required to complete the form. Left message on voice mail to call the office to schedule appt with fasting labs. Unable to complete forms without CPE and lipid panel. Encounter closed and form placed up front in file until appt is scheduled.

## 2013-05-27 NOTE — Telephone Encounter (Signed)
Will complete the form with the most updated information

## 2013-07-08 ENCOUNTER — Encounter: Payer: Self-pay | Admitting: Internal Medicine

## 2013-07-08 ENCOUNTER — Ambulatory Visit (INDEPENDENT_AMBULATORY_CARE_PROVIDER_SITE_OTHER): Payer: Managed Care, Other (non HMO) | Admitting: Internal Medicine

## 2013-07-08 VITALS — BP 110/70 | HR 60 | Temp 98.0°F | Wt 124.0 lb

## 2013-07-08 DIAGNOSIS — G5600 Carpal tunnel syndrome, unspecified upper limb: Secondary | ICD-10-CM

## 2013-07-08 DIAGNOSIS — F988 Other specified behavioral and emotional disorders with onset usually occurring in childhood and adolescence: Secondary | ICD-10-CM

## 2013-07-08 DIAGNOSIS — G5601 Carpal tunnel syndrome, right upper limb: Secondary | ICD-10-CM

## 2013-07-08 DIAGNOSIS — M25521 Pain in right elbow: Secondary | ICD-10-CM

## 2013-07-08 DIAGNOSIS — Z0000 Encounter for general adult medical examination without abnormal findings: Secondary | ICD-10-CM

## 2013-07-08 DIAGNOSIS — M25529 Pain in unspecified elbow: Secondary | ICD-10-CM

## 2013-07-08 MED ORDER — PREDNISONE 10 MG PO TABS: ORAL_TABLET | ORAL | Status: DC

## 2013-07-08 NOTE — Assessment & Plan Note (Addendum)
Symptoms currently well controlled.Good compliance of medication, did not take medication today. UDS 06/2012 negative. Plan: Refill when necessary, UDS

## 2013-07-08 NOTE — Progress Notes (Signed)
   Subjective:    Patient ID: Leah Jones, female    DOB: 05/27/1961, 52 y.o.   MRN: [id number]  HPI Acute visit, we discussed the following One-month history of what she describes as severe pain around  the R elbow, reports no recent injury or overuse. Pain is worse when she tries to pick up things, extend or flex  her elbow. Is taking several motrins a day.  Patient believes pain is due to CTS  ADD, good medication compliance, due for a UDS.  Also had surgery on her left foot, still having some discomfort, numbness   Past Medical History  Diagnosis Date  . ADD (attention deficit disorder)   . Carpal tunnel syndrome   . B12 deficiency    Past Surgical History  Procedure Laterality Date  . Tonsillectomy    . Rhinoplasty    . Foot surgery Left     Morton's neuroma, Dr Charlsie Merles     Review of Systems Denies any neck pain per se or rash in the neck. Mild decreased in her strength, right hand. + Paresthesias on the right hand , mostly at night. Denies any swelling, redness of the elbow itself.    Objective:   Physical Exam  Musculoskeletal:       Arms:  BP 110/70  Pulse 60  Temp(Src) 98 F (36.7 C)  Wt 124 lb (56.246 kg)  SpO2 94% General -- alert, well-developed, NAD.  Neck --FROM., no TTP at the cervical spine  Extremities--  Inspection and palpation of the upper extremities normal, no inflammation at elbows, wrists, hands. Range of motion of the right elbow normal.  Neurologic--  alert & oriented X3. Speech normal, gait normal, strength normal in all extremities.  DTRs symmetric   Psych-- Cognition and judgment appear intact. Cooperative with normal attention span and concentration. No anxious appearing , no depressed appearing.       Assessment & Plan:  Right arm pain-- suspect most of the pain is from tendinitis. Will prescribe prednisone, Tylenol. She has an appointment to see the orthopedic Dr. 07/13/2013. Encouraged to keep the appointment.  Status post  surgery left foot,Still having some discomfort, recommend to discuss with her surgeon

## 2013-07-08 NOTE — Progress Notes (Signed)
Pre visit review using our clinic review tool, if applicable. No additional management support is needed unless otherwise documented below in the visit note. 

## 2013-07-08 NOTE — Patient Instructions (Addendum)
Stop motrin Take prednisone as prescribed If the pain continue, take  Tylenol  500 mg OTC 2 tabs a day every 8 hours as needed    Go to the lab before you leave  Next visit for a physical exam   fasting, in 6 months  Please make an appointment

## 2013-07-08 NOTE — Assessment & Plan Note (Signed)
Presents w/pain and right hand paresthesias, see history of present illness. The numbness in the hands likely due to CTS but the pain is most likely from tendinitis. To see orthopedic surgery soon

## 2013-07-14 ENCOUNTER — Telehealth: Payer: Self-pay | Admitting: *Deleted

## 2013-07-14 NOTE — Telephone Encounter (Signed)
UDS collected on 07/08/13, patient tested positive for adderall. Low risk, recheck UDS in 6 months per protocol.

## 2013-09-02 ENCOUNTER — Encounter: Payer: Self-pay | Admitting: Internal Medicine

## 2013-11-24 ENCOUNTER — Other Ambulatory Visit: Payer: Self-pay | Admitting: Internal Medicine

## 2013-11-25 ENCOUNTER — Other Ambulatory Visit: Payer: Self-pay | Admitting: Internal Medicine

## 2013-11-29 NOTE — Telephone Encounter (Signed)
Rx sent to the pharmacy by e-script.//AB/CMA 

## 2013-12-03 ENCOUNTER — Other Ambulatory Visit (HOSPITAL_BASED_OUTPATIENT_CLINIC_OR_DEPARTMENT_OTHER): Payer: Self-pay | Admitting: Obstetrics and Gynecology

## 2013-12-03 DIAGNOSIS — Z139 Encounter for screening, unspecified: Secondary | ICD-10-CM

## 2013-12-08 ENCOUNTER — Ambulatory Visit (HOSPITAL_BASED_OUTPATIENT_CLINIC_OR_DEPARTMENT_OTHER): Payer: Managed Care, Other (non HMO)

## 2013-12-15 ENCOUNTER — Ambulatory Visit (HOSPITAL_BASED_OUTPATIENT_CLINIC_OR_DEPARTMENT_OTHER): Payer: Managed Care, Other (non HMO)

## 2014-01-19 ENCOUNTER — Ambulatory Visit (HOSPITAL_BASED_OUTPATIENT_CLINIC_OR_DEPARTMENT_OTHER)
Admission: RE | Admit: 2014-01-19 | Discharge: 2014-01-19 | Disposition: A | Discharge status: Home / Self Care | Payer: Managed Care, Other (non HMO) | Source: Ambulatory Visit | Attending: Obstetrics and Gynecology | Admitting: Obstetrics and Gynecology

## 2014-01-19 DIAGNOSIS — Z139 Encounter for screening, unspecified: Secondary | ICD-10-CM

## 2014-01-19 DIAGNOSIS — Z1231 Encounter for screening mammogram for malignant neoplasm of breast: Secondary | ICD-10-CM | POA: Insufficient documentation

## 2014-01-25 ENCOUNTER — Ambulatory Visit (INDEPENDENT_AMBULATORY_CARE_PROVIDER_SITE_OTHER): Payer: Managed Care, Other (non HMO) | Admitting: Family Medicine

## 2014-01-25 ENCOUNTER — Encounter: Payer: Self-pay | Admitting: Family Medicine

## 2014-01-25 ENCOUNTER — Telehealth: Payer: Self-pay | Admitting: Internal Medicine

## 2014-01-25 VITALS — BP 143/96 | HR 97 | Temp 99.0°F | Resp 18 | Ht 66.0 in | Wt 126.0 lb

## 2014-01-25 DIAGNOSIS — H6981 Other specified disorders of Eustachian tube, right ear: Secondary | ICD-10-CM

## 2014-01-25 DIAGNOSIS — J309 Allergic rhinitis, unspecified: Secondary | ICD-10-CM

## 2014-01-25 DIAGNOSIS — H6980 Other specified disorders of Eustachian tube, unspecified ear: Secondary | ICD-10-CM

## 2014-01-25 DIAGNOSIS — H6990 Unspecified Eustachian tube disorder, unspecified ear: Secondary | ICD-10-CM

## 2014-01-25 HISTORY — DX: Unspecified eustachian tube disorder, unspecified ear: H69.90

## 2014-01-25 HISTORY — DX: Other specified disorders of Eustachian tube, unspecified ear: H69.80

## 2014-01-25 NOTE — Progress Notes (Signed)
OFFICE NOTE  01/25/2014  CC:  Chief Complaint  Patient presents with  . Otalgia    right ear since yesterday  . Nasal Congestion  . Eye Burn   HPI: Patient is a 53 y.o. Caucasian female who is here for right ear pain, nasal congestion. Onset right ear pain yesterday morning.  Ibup and allergy med helped but it returned again today. Describes "weeks" of nasal congestion.  Took a Z-pack rx'd by her ADD MD a couple of weeks ago.   Says this helped a lot.  More sneezing last night.  Takes OTC nonsedating antihistamine and flonase daily.   Pertinent PMH:  Past medical, surgical, hx reviewed.  MEDS:  Not taking prednisone listed below Outpatient Prescriptions Prior to Visit  Medication Sig Dispense Refill  . amphetamine-dextroamphetamine (ADDERALL) 20 MG tablet Take 1 tablet (20 mg total) by mouth 2 (two) times daily.  60 tablet  0  . fish oil-omega-3 fatty acids 1000 MG capsule Take 2 g by mouth daily.        . fluticasone (FLONASE) 50 MCG/ACT nasal spray USE 1 SPRAY IN EACH NOSTRIL TWICE A DAY AS NEEDED.  16 g  5  . LORazepam (ATIVAN) 0.5 MG tablet Take 0.5 mg by mouth every 8 (eight) hours.      . Multiple Vitamin (MULTIVITAMIN) capsule Take 1 capsule by mouth daily.        Marland Kitchen omeprazole (PRILOSEC) 40 MG capsule Take 1 capsule (40 mg total) by mouth daily.  30 capsule  1  . Probiotic Product (PROBIOTIC FORMULA PO) Take by mouth 1 dose over 46 hours.        . Vitamins B1 B6 B12 LIQD Take by mouth. Drops under tongue once daily      . predniSONE (DELTASONE) 10 MG tablet 4 tablets x 2 days, 3 tabs x 2 days, 2 tabs x 2 days, 1 tab x 2 days  20 tablet  0   No facility-administered medications prior to visit.    PE: Blood pressure 143/96, pulse 97, temperature 99 F (37.2 C), temperature source Temporal, resp. rate 18, height 5\' 6"  (1.676 m), weight 126 lb (57.153 kg), last menstrual period 01/12/2014, SpO2 100.00%. VS: noted--normal. Gen: alert, NAD, NONTOXIC APPEARING. HEENT: eyes  without injection, drainage, or swelling.  Ears: EACs clear, TMs with normal light reflex and landmarks.  Right TM retracted.  No fluid in either middle ear.  Nose: scant clear rhinorrhea, with some dried, crusty exudate adherent to mildly injected mucosa.  No purulent d/c.  No paranasal sinus TTP.  No facial swelling.  Throat and mouth without focal lesion.  No pharyngial swelling, erythema, or exudate.   Neck: supple, no LAD.   LUNGS: CTA bilat, nonlabored resps.   CV: RRR, no m/r/g. EXT: no c/c/e SKIN: no rash   IMPRESSION AND PLAN:  Eustachian tube dysfunction, right. Allergic rhinitis, pretty well controlled on flonase and otc nonsedating antihistamine. Discussed dx with pt, reassured her that no infection was present. Advised her to continue flonase daily, try "behind the counter" allegra D 12 or 24h. Occasionally she can blow against closed nostrils in attempt to open tube.  An After Visit Summary was printed and given to the patient.  FOLLOW UP: prn

## 2014-01-25 NOTE — Patient Instructions (Signed)
"  Behind the counter" generic allegra D

## 2014-01-25 NOTE — Telephone Encounter (Signed)
Relevant patient education assigned to patient using Emmi. ° °

## 2014-01-25 NOTE — Progress Notes (Signed)
Pre visit review using our clinic review tool, if applicable. No additional management support is needed unless otherwise documented below in the visit note. 

## 2014-05-19 ENCOUNTER — Encounter: Payer: Self-pay | Admitting: Nurse Practitioner

## 2014-05-19 ENCOUNTER — Ambulatory Visit (INDEPENDENT_AMBULATORY_CARE_PROVIDER_SITE_OTHER): Payer: Managed Care, Other (non HMO) | Admitting: Nurse Practitioner

## 2014-05-19 ENCOUNTER — Other Ambulatory Visit: Payer: Managed Care, Other (non HMO)

## 2014-05-19 VITALS — BP 106/75 | HR 87 | Temp 98.5°F | Resp 18 | Ht 66.0 in | Wt 123.0 lb

## 2014-05-19 DIAGNOSIS — F430 Acute stress reaction: Secondary | ICD-10-CM

## 2014-05-19 DIAGNOSIS — F410 Panic disorder [episodic paroxysmal anxiety] without agoraphobia: Secondary | ICD-10-CM

## 2014-05-19 DIAGNOSIS — Z0000 Encounter for general adult medical examination without abnormal findings: Secondary | ICD-10-CM

## 2014-05-19 DIAGNOSIS — L819 Disorder of pigmentation, unspecified: Secondary | ICD-10-CM

## 2014-05-19 LAB — CBC
HEMATOCRIT: 42.8 % (ref 36.0–46.0)
Hemoglobin: 13.9 g/dL (ref 12.0–15.0)
MCHC: 32.3 g/dL (ref 30.0–36.0)
MCV: 96.3 fl (ref 78.0–100.0)
Platelets: 383 10*3/uL (ref 150.0–400.0)
RBC: 4.45 Mil/uL (ref 3.87–5.11)
RDW: 14 % (ref 11.5–15.5)
WBC: 6.6 10*3/uL (ref 4.0–10.5)

## 2014-05-19 NOTE — Patient Instructions (Signed)
Our office will call you with lab results and any necessary follow up. Form will be completed once lab results are back.  We can fax, mail, or you can pick up. Use ativan when you feel tightness in chest. If no relief, please call Dr Larose Kells. Nice to see you!  Preventive Care for Adults, Female A healthy lifestyle and preventive care can promote health and wellness. Preventive health guidelines for women include the following key practices.  A routine yearly physical is a good way to check with your caregiver about your health and preventive screening. It is a chance to share any concerns and updates on your health, and to receive a thorough exam.  Visit your dentist for a routine exam and preventive care every 6 months. Brush your teeth twice a day and floss once a day. Good oral hygiene prevents tooth decay and gum disease.  The frequency of eye exams is based on your age, health, family medical history, use of contact lenses, and other factors. Follow your caregiver's recommendations for frequency of eye exams.  Eat a healthy diet. Foods like vegetables, fruits, whole grains, low-fat dairy products, and lean protein foods contain the nutrients you need without too many calories. Decrease your intake of foods high in solid fats, added sugars, and salt. Eat the right amount of calories for you.Get information about a proper diet from your caregiver, if necessary.  Regular physical exercise is one of the most important things you can do for your health. Most adults should get at least 150 minutes of moderate-intensity exercise (any activity that increases your heart rate and causes you to sweat) each week. In addition, most adults need muscle-strengthening exercises on 2 or more days a week.  Maintain a healthy weight. The body mass index (BMI) is a screening tool to identify possible weight problems. It provides an estimate of body fat based on height and weight. Your caregiver can help determine your  BMI, and can help you achieve or maintain a healthy weight.For adults 20 years and older:  A BMI below 18.5 is considered underweight.  A BMI of 18.5 to 24.9 is normal.  A BMI of 25 to 29.9 is considered overweight.  A BMI of 30 and above is considered obese.  Maintain normal blood lipids and cholesterol levels by exercising and minimizing your intake of saturated fat. Eat a balanced diet with plenty of fruit and vegetables. Blood tests for lipids and cholesterol should begin at age 54 and be repeated every 5 years. If your lipid or cholesterol levels are high, you are over 50, or you are at high risk for heart disease, you may need your cholesterol levels checked more frequently.Ongoing high lipid and cholesterol levels should be treated with medicines if diet and exercise are not effective.  If you smoke, find out from your caregiver how to quit. If you do not use tobacco, do not start.  Lung cancer screening is recommended for adults aged 44 80 years who are at high risk for developing lung cancer because of a history of smoking. Yearly low-dose computed tomography (CT) is recommended for people who have at least a 30-pack-year history of smoking and are a current smoker or have quit within the past 15 years. A pack year of smoking is smoking an average of 1 pack of cigarettes a day for 1 year (for example: 1 pack a day for 30 years or 2 packs a day for 15 years). Yearly screening should continue until the smoker  has stopped smoking for at least 15 years. Yearly screening should also be stopped for people who develop a health problem that would prevent them from having lung cancer treatment.  If you are pregnant, do not drink alcohol. If you are breastfeeding, be very cautious about drinking alcohol. If you are not pregnant and choose to drink alcohol, do not exceed 1 drink per day. One drink is considered to be 12 ounces (355 mL) of beer, 5 ounces (148 mL) of wine, or 1.5 ounces (44 mL) of  liquor.  Avoid use of street drugs. Do not share needles with anyone. Ask for help if you need support or instructions about stopping the use of drugs.  High blood pressure causes heart disease and increases the risk of stroke. Your blood pressure should be checked at least every 1 to 2 years. Ongoing high blood pressure should be treated with medicines if weight loss and exercise are not effective.  If you are 55 to 51 years old, ask your caregiver if you should take aspirin to prevent strokes.  Diabetes screening involves taking a blood sample to check your fasting blood sugar level. This should be done once every 3 years, after age 81, if you are within normal weight and without risk factors for diabetes. Testing should be considered at a younger age or be carried out more frequently if you are overweight and have at least 1 risk factor for diabetes.  Breast cancer screening is essential preventive care for women. You should practice "breast self-awareness." This means understanding the normal appearance and feel of your breasts and may include breast self-examination. Any changes detected, no matter how small, should be reported to a caregiver. Women in their 36s and 30s should have a clinical breast exam (CBE) by a caregiver as part of a regular health exam every 1 to 3 years. After age 24, women should have a CBE every year. Starting at age 69, women should consider having a mammography (breast X-ray test) every year. Women who have a family history of breast cancer should talk to their caregiver about genetic screening. Women at a high risk of breast cancer should talk to their caregivers about having magnetic resonance imaging (MRI) and a mammography every year.  Breast cancer gene (BRCA)-related cancer risk assessment is recommended for women who have family members with BRCA-related cancers. BRCA-related cancers include breast, ovarian, tubal, and peritoneal cancers. Having family members with  these cancers may be associated with an increased risk for harmful changes (mutations) in the breast cancer genes BRCA1 and BRCA2. Results of the assessment will determine the need for genetic counseling and BRCA1 and BRCA2 testing.  The Pap test is a screening test for cervical cancer. A Pap test can show cell changes on the cervix that might become cervical cancer if left untreated. A Pap test is a procedure in which cells are obtained and examined from the lower end of the uterus (cervix).  Women should have a Pap test starting at age 42.  Between ages 50 and 76, Pap tests should be repeated every 2 years.  Beginning at age 52, you should have a Pap test every 3 years as long as the past 3 Pap tests have been normal.  Some women have medical problems that increase the chance of getting cervical cancer. Talk to your caregiver about these problems. It is especially important to talk to your caregiver if a new problem develops soon after your last Pap test. In these cases, your  caregiver may recommend more frequent screening and Pap tests.  The above recommendations are the same for women who have or have not gotten the vaccine for human papillomavirus (HPV).  If you had a hysterectomy for a problem that was not cancer or a condition that could lead to cancer, then you no longer need Pap tests. Even if you no longer need a Pap test, a regular exam is a good idea to make sure no other problems are starting.  If you are between ages 52 and 38, and you have had normal Pap tests going back 10 years, you no longer need Pap tests. Even if you no longer need a Pap test, a regular exam is a good idea to make sure no other problems are starting.  If you have had past treatment for cervical cancer or a condition that could lead to cancer, you need Pap tests and screening for cancer for at least 20 years after your treatment.  If Pap tests have been discontinued, risk factors (such as a new sexual partner)  need to be reassessed to determine if screening should be resumed.  The HPV test is an additional test that may be used for cervical cancer screening. The HPV test looks for the virus that can cause the cell changes on the cervix. The cells collected during the Pap test can be tested for HPV. The HPV test could be used to screen women aged 83 years and older, and should be used in women of any age who have unclear Pap test results. After the age of 101, women should have HPV testing at the same frequency as a Pap test.  Colorectal cancer can be detected and often prevented. Most routine colorectal cancer screening begins at the age of 54 and continues through age 62. However, your caregiver may recommend screening at an earlier age if you have risk factors for colon cancer. On a yearly basis, your caregiver may provide home test kits to check for hidden blood in the stool. Use of a small camera at the end of a tube, to directly examine the colon (sigmoidoscopy or colonoscopy), can detect the earliest forms of colorectal cancer. Talk to your caregiver about this at age 64, when routine screening begins. Direct examination of the colon should be repeated every 5 to 10 years through age 46, unless early forms of pre-cancerous polyps or small growths are found.  Hepatitis C blood testing is recommended for all people born from [birth year] through 1965 and any individual with known risks for hepatitis C.  Practice safe sex. Use condoms and avoid high-risk sexual practices to reduce the spread of sexually transmitted infections (STIs). STIs include gonorrhea, chlamydia, syphilis, trichomonas, herpes, HPV, and human immunodeficiency virus (HIV). Herpes, HIV, and HPV are viral illnesses that have no cure. They can result in disability, cancer, and death. Sexually active women aged 32 and younger should be checked for chlamydia. Older women with new or multiple partners should also be tested for chlamydia. Testing for other  STIs is recommended if you are sexually active and at increased risk.  Osteoporosis is a disease in which the bones lose minerals and strength with aging. This can result in serious bone fractures. The risk of osteoporosis can be identified using a bone density scan. Women ages 65 and over and women at risk for fractures or osteoporosis should discuss screening with their caregivers. Ask your caregiver whether you should take a calcium supplement or vitamin D to reduce the rate  of osteoporosis.  Menopause can be associated with physical symptoms and risks. Hormone replacement therapy is available to decrease symptoms and risks. You should talk to your caregiver about whether hormone replacement therapy is right for you.  Use sunscreen. Apply sunscreen liberally and repeatedly throughout the day. You should seek shade when your shadow is shorter than you. Protect yourself by wearing long sleeves, pants, a wide-brimmed hat, and sunglasses year round, whenever you are outdoors.  Once a month, do a whole body skin exam, using a mirror to look at the skin on your back. Notify your caregiver of new moles, moles that have irregular borders, moles that are larger than a pencil eraser, or moles that have changed in shape or color.  Stay current with required immunizations.  Influenza vaccine. All adults should be immunized every year.  Tetanus, diphtheria, and acellular pertussis (Td, Tdap) vaccine. Pregnant women should receive 1 dose of Tdap vaccine during each pregnancy. The dose should be obtained regardless of the length of time since the last dose. Immunization is preferred during the 27th to 36th week of gestation. An adult who has not previously received Tdap or who does not know her vaccine status should receive 1 dose of Tdap. This initial dose should be followed by tetanus and diphtheria toxoids (Td) booster doses every 10 years. Adults with an unknown or incomplete history of completing a 3-dose  immunization series with Td-containing vaccines should begin or complete a primary immunization series including a Tdap dose. Adults should receive a Td booster every 10 years.  Varicella vaccine. An adult without evidence of immunity to varicella should receive 2 doses or a second dose if she has previously received 1 dose. Pregnant females who do not have evidence of immunity should receive the first dose after pregnancy. This first dose should be obtained before leaving the health care facility. The second dose should be obtained 4 8 weeks after the first dose.  Human papillomavirus (HPV) vaccine. Females aged 5 26 years who have not received the vaccine previously should obtain the 3-dose series. The vaccine is not recommended for use in pregnant females. However, pregnancy testing is not needed before receiving a dose. If a female is found to be pregnant after receiving a dose, no treatment is needed. In that case, the remaining doses should be delayed until after the pregnancy. Immunization is recommended for any person with an immunocompromised condition through the age of 10 years if she did not get any or all doses earlier. During the 3-dose series, the second dose should be obtained 4 8 weeks after the first dose. The third dose should be obtained 24 weeks after the first dose and 16 weeks after the second dose.  Zoster vaccine. One dose is recommended for adults aged 21 years or older unless certain conditions are present.  Measles, mumps, and rubella (MMR) vaccine. Adults born before 80 generally are considered immune to measles and mumps. Adults born in [birth year] or later should have 1 or more doses of MMR vaccine unless there is a contraindication to the vaccine or there is laboratory evidence of immunity to each of the three diseases. A routine second dose of MMR vaccine should be obtained at least 28 days after the first dose for students attending postsecondary schools, health care workers, or  international travelers. People who received inactivated measles vaccine or an unknown type of measles vaccine during 1963 1967 should receive 2 doses of MMR vaccine. People who received inactivated mumps vaccine or  an unknown type of mumps vaccine before 1979 and are at high risk for mumps infection should consider immunization with 2 doses of MMR vaccine. For females of childbearing age, rubella immunity should be determined. If there is no evidence of immunity, females who are not pregnant should be vaccinated. If there is no evidence of immunity, females who are pregnant should delay immunization until after pregnancy. Unvaccinated health care workers born before [birth year] who lack laboratory evidence of measles, mumps, or rubella immunity or laboratory confirmation of disease should consider measles and mumps immunization with 2 doses of MMR vaccine or rubella immunization with 1 dose of MMR vaccine.  Pneumococcal 13-valent conjugate (PCV13) vaccine. When indicated, a person who is uncertain of her immunization history and has no record of immunization should receive the PCV13 vaccine. An adult aged 86 years or older who has certain medical conditions and has not been previously immunized should receive 1 dose of PCV13 vaccine. This PCV13 should be followed with a dose of pneumococcal polysaccharide (PPSV23) vaccine. The PPSV23 vaccine dose should be obtained at least 8 weeks after the dose of PCV13 vaccine. An adult aged 69 years or older who has certain medical conditions and previously received 1 or more doses of PPSV23 vaccine should receive 1 dose of PCV13. The PCV13 vaccine dose should be obtained 1 or more years after the last PPSV23 vaccine dose.  Pneumococcal polysaccharide (PPSV23) vaccine. When PCV13 is also indicated, PCV13 should be obtained first. All adults aged 48 years and older should be immunized. An adult younger than age 80 years who has certain medical conditions should be immunized. Any  person who resides in a nursing home or long-term care facility should be immunized. An adult smoker should be immunized. People with an immunocompromised condition and certain other conditions should receive both PCV13 and PPSV23 vaccines. People with human immunodeficiency virus (HIV) infection should be immunized as soon as possible after diagnosis. Immunization during chemotherapy or radiation therapy should be avoided. Routine use of PPSV23 vaccine is not recommended for American Indians, [place] Natives, or people younger than 65 years unless there are medical conditions that require PPSV23 vaccine. When indicated, people who have unknown immunization and have no record of immunization should receive PPSV23 vaccine. One-time revaccination 5 years after the first dose of PPSV23 is recommended for people aged 80 64 years who have chronic kidney failure, nephrotic syndrome, asplenia, or immunocompromised conditions. People who received 1 2 doses of PPSV23 before age 26 years should receive another dose of PPSV23 vaccine at age 37 years or later if at least 5 years have passed since the previous dose. Doses of PPSV23 are not needed for people immunized with PPSV23 at or after age 52 years.  Meningococcal vaccine. Adults with asplenia or persistent complement component deficiencies should receive 2 doses of quadrivalent meningococcal conjugate (MenACWY-D) vaccine. The doses should be obtained at least 2 months apart. Microbiologists working with certain meningococcal bacteria, [place] recruits, people at risk during an outbreak, and people who travel to or live in countries with a high rate of meningitis should be immunized. A first-year college student up through age 19 years who is living in a residence hall should receive a dose if she did not receive a dose on or after her 16th birthday. Adults who have certain high-risk conditions should receive one or more doses of vaccine.  Hepatitis A vaccine. Adults  who wish to be protected from this disease, have certain high-risk conditions, work with hepatitis A-infected  animals, work in hepatitis A research labs, or travel to or work in countries with a high rate of hepatitis A should be immunized. Adults who were previously unvaccinated and who anticipate close contact with an international adoptee during the first 60 days after arrival in the [place] from a country with a high rate of hepatitis A should be immunized.  Hepatitis B vaccine. Adults who wish to be protected from this disease, have certain high-risk conditions, may be exposed to blood or other infectious body fluids, are household contacts or sex partners of hepatitis B positive people, are clients or workers in certain care facilities, or travel to or work in countries with a high rate of hepatitis B should be immunized.  Haemophilus influenzae type b (Hib) vaccine. A previously unvaccinated person with asplenia or sickle cell disease or having a scheduled splenectomy should receive 1 dose of Hib vaccine. Regardless of previous immunization, a recipient of a hematopoietic stem cell transplant should receive a 3-dose series 6 12 months after her successful transplant. Hib vaccine is not recommended for adults with HIV infection. Preventive Services / Frequency Ages 52 to 58  Blood pressure check.** / Every 1 to 2 years.  Lipid and cholesterol check.** / Every 5 years beginning at age 60.  Clinical breast exam.** / Every 3 years for women in their 45s and 34s.  BRCA-related cancer risk assessment.** / For women who have family members with a BRCA-related cancer (breast, ovarian, tubal, or peritoneal cancers).  Pap test.** / Every 2 years from ages 79 through 33. Every 3 years starting at age 43 through age 68 or 46 with a history of 3 consecutive normal Pap tests.  HPV screening.** / Every 3 years from ages 58 through ages 59 to 43 with a history of 3 consecutive normal Pap  tests.  Hepatitis C blood test.** / For any individual with known risks for hepatitis C.  Skin self-exam. / Monthly.  Influenza vaccine. / Every year.  Tetanus, diphtheria, and acellular pertussis (Tdap, Td) vaccine.** / Consult your caregiver. Pregnant women should receive 1 dose of Tdap vaccine during each pregnancy. 1 dose of Td every 10 years.  Varicella vaccine.** / Consult your caregiver. Pregnant females who do not have evidence of immunity should receive the first dose after pregnancy.  HPV vaccine. / 3 doses over 6 months, if 26 and younger. The vaccine is not recommended for use in pregnant females. However, pregnancy testing is not needed before receiving a dose.  Measles, mumps, rubella (MMR) vaccine.** / You need at least 1 dose of MMR if you were born in 1957 or later. You may also need a 2nd dose. For females of childbearing age, rubella immunity should be determined. If there is no evidence of immunity, females who are not pregnant should be vaccinated. If there is no evidence of immunity, females who are pregnant should delay immunization until after pregnancy.  Pneumococcal 13-valent conjugate (PCV13) vaccine.** / Consult your caregiver.  Pneumococcal polysaccharide (PPSV23) vaccine.** / 1 to 2 doses if you smoke cigarettes or if you have certain conditions.  Meningococcal vaccine.** / 1 dose if you are age 48 to 58 years and a Market researcher living in a residence hall, or have one of several medical conditions, you need to get vaccinated against meningococcal disease. You may also need additional booster doses.  Hepatitis A vaccine.** / Consult your caregiver.  Hepatitis B vaccine.** / Consult your caregiver.  Haemophilus influenzae type b (Hib) vaccine.** /  Consult your caregiver. Ages 8 to 17  Blood pressure check.** / Every 1 to 2 years.  Lipid and cholesterol check.** / Every 5 years beginning at age 56.  Lung cancer screening. / Every year if you  are aged 59 80 years and have a 30-pack-year history of smoking and currently smoke or have quit within the past 15 years. Yearly screening is stopped once you have quit smoking for at least 15 years or develop a health problem that would prevent you from having lung cancer treatment.  Clinical breast exam.** / Every year after age 39.  BRCA-related cancer risk assessment.** / For women who have family members with a BRCA-related cancer (breast, ovarian, tubal, or peritoneal cancers).  Mammogram.** / Every year beginning at age 37 and continuing for as long as you are in good health. Consult with your caregiver.  Pap test.** / Every 3 years starting at age 80 through age 31 or 62 with a history of 3 consecutive normal Pap tests.  HPV screening.** / Every 3 years from ages 85 through ages 16 to 79 with a history of 3 consecutive normal Pap tests.  Fecal occult blood test (FOBT) of stool. / Every year beginning at age 80 and continuing until age 64. You may not need to do this test if you get a colonoscopy every 10 years.  Flexible sigmoidoscopy or colonoscopy.** / Every 5 years for a flexible sigmoidoscopy or every 10 years for a colonoscopy beginning at age 90 and continuing until age 55.  Hepatitis C blood test.** / For all people born from [birth year] through 1963 and any individual with known risks for hepatitis C.  Skin self-exam. / Monthly.  Influenza vaccine. / Every year.  Tetanus, diphtheria, and acellular pertussis (Tdap/Td) vaccine.** / Consult your caregiver. Pregnant women should receive 1 dose of Tdap vaccine during each pregnancy. 1 dose of Td every 10 years.  Varicella vaccine.** / Consult your caregiver. Pregnant females who do not have evidence of immunity should receive the first dose after pregnancy.  Zoster vaccine.** / 1 dose for adults aged 55 years or older.  Measles, mumps, rubella (MMR) vaccine.** / You need at least 1 dose of MMR if you were born in 1957 or later. You  may also need a 2nd dose. For females of childbearing age, rubella immunity should be determined. If there is no evidence of immunity, females who are not pregnant should be vaccinated. If there is no evidence of immunity, females who are pregnant should delay immunization until after pregnancy.  Pneumococcal 13-valent conjugate (PCV13) vaccine.** / Consult your caregiver.  Pneumococcal polysaccharide (PPSV23) vaccine.** / 1 to 2 doses if you smoke cigarettes or if you have certain conditions.  Meningococcal vaccine.** / Consult your caregiver.  Hepatitis A vaccine.** / Consult your caregiver.  Hepatitis B vaccine.** / Consult your caregiver.  Haemophilus influenzae type b (Hib) vaccine.** / Consult your caregiver. Ages 41 and over  Blood pressure check.** / Every 1 to 2 years.  Lipid and cholesterol check.** / Every 5 years beginning at age 10.  Lung cancer screening. / Every year if you are aged 80 80 years and have a 30-pack-year history of smoking and currently smoke or have quit within the past 15 years. Yearly screening is stopped once you have quit smoking for at least 15 years or develop a health problem that would prevent you from having lung cancer treatment.  Clinical breast exam.** / Every year after age 12.  BRCA-related cancer risk  assessment.** / For women who have family members with a BRCA-related cancer (breast, ovarian, tubal, or peritoneal cancers).  Mammogram.** / Every year beginning at age 10 and continuing for as long as you are in good health. Consult with your caregiver.  Pap test.** / Every 3 years starting at age 45 through age 55 or 82 with a 3 consecutive normal Pap tests. Testing can be stopped between 65 and 70 with 3 consecutive normal Pap tests and no abnormal Pap or HPV tests in the past 10 years.  HPV screening.** / Every 3 years from ages 52 through ages 52 or 60 with a history of 3 consecutive normal Pap tests. Testing can be stopped between 65 and  70 with 3 consecutive normal Pap tests and no abnormal Pap or HPV tests in the past 10 years.  Fecal occult blood test (FOBT) of stool. / Every year beginning at age 73 and continuing until age 40. You may not need to do this test if you get a colonoscopy every 10 years.  Flexible sigmoidoscopy or colonoscopy.** / Every 5 years for a flexible sigmoidoscopy or every 10 years for a colonoscopy beginning at age 4 and continuing until age 13.  Hepatitis C blood test.** / For all people born from [birth year] through 1963 and any individual with known risks for hepatitis C.  Osteoporosis screening.** / A one-time screening for women ages 20 and over and women at risk for fractures or osteoporosis.  Skin self-exam. / Monthly.  Influenza vaccine. / Every year.  Tetanus, diphtheria, and acellular pertussis (Tdap/Td) vaccine.** / 1 dose of Td every 10 years.  Varicella vaccine.** / Consult your caregiver.  Zoster vaccine.** / 1 dose for adults aged 20 years or older.  Pneumococcal 13-valent conjugate (PCV13) vaccine.** / Consult your caregiver.  Pneumococcal polysaccharide (PPSV23) vaccine.** / 1 dose for all adults aged 25 years and older.  Meningococcal vaccine.** / Consult your caregiver.  Hepatitis A vaccine.** / Consult your caregiver.  Hepatitis B vaccine.** / Consult your caregiver.  Haemophilus influenzae type b (Hib) vaccine.** / Consult your caregiver. ** Family history and personal history of risk and conditions may change your caregiver's recommendations. Document Released: 09/03/2001 Document Revised: 11/02/2012 Document Reviewed: 12/03/2010 [facility] Patient Information 2014 [place], [place].

## 2014-05-19 NOTE — Progress Notes (Signed)
Pre visit review using our clinic review tool, if applicable. No additional management support is needed unless otherwise documented below in the visit note. 

## 2014-05-20 ENCOUNTER — Telehealth: Payer: Self-pay | Admitting: Nurse Practitioner

## 2014-05-20 DIAGNOSIS — Z7689 Persons encountering health services in other specified circumstances: Secondary | ICD-10-CM | POA: Insufficient documentation

## 2014-05-20 DIAGNOSIS — F430 Acute stress reaction: Secondary | ICD-10-CM

## 2014-05-20 DIAGNOSIS — Z0000 Encounter for general adult medical examination without abnormal findings: Secondary | ICD-10-CM | POA: Insufficient documentation

## 2014-05-20 DIAGNOSIS — L819 Disorder of pigmentation, unspecified: Secondary | ICD-10-CM | POA: Insufficient documentation

## 2014-05-20 DIAGNOSIS — F410 Panic disorder [episodic paroxysmal anxiety] without agoraphobia: Secondary | ICD-10-CM | POA: Insufficient documentation

## 2014-05-20 HISTORY — DX: Disorder of pigmentation, unspecified: L81.9

## 2014-05-20 LAB — COMPREHENSIVE METABOLIC PANEL
ALT: 22 U/L (ref 0–35)
AST: 23 U/L (ref 0–37)
Albumin: 3.7 g/dL (ref 3.5–5.2)
Alkaline Phosphatase: 49 U/L (ref 39–117)
BILIRUBIN TOTAL: 0.6 mg/dL (ref 0.2–1.2)
BUN: 8 mg/dL (ref 6–23)
CO2: 31 meq/L (ref 19–32)
CREATININE: 0.7 mg/dL (ref 0.4–1.2)
Calcium: 9.1 mg/dL (ref 8.4–10.5)
Chloride: 103 mEq/L (ref 96–112)
GFR: 89.35 mL/min (ref >60.00)
Glucose, Bld: 80 mg/dL (ref 70–99)
Potassium: 4.5 mEq/L (ref 3.5–5.1)
Sodium: 136 mEq/L (ref 135–145)
Total Protein: 7 g/dL (ref 6.0–8.3)

## 2014-05-20 LAB — VITAMIN D 25 HYDROXY (VIT D DEFICIENCY, FRACTURES): VITD: 28.06 ng/mL — ABNORMAL LOW (ref 30.00–100.00)

## 2014-05-20 LAB — LIPID PANEL
CHOLESTEROL: 164 mg/dL (ref 0–200)
HDL: 77 mg/dL (ref >39.00)
LDL Cholesterol: 73 mg/dL (ref 0–99)
NonHDL: 87
TRIGLYCERIDES: 70 mg/dL (ref 0.0–149.0)
Total CHOL/HDL Ratio: 2
VLDL: 14 mg/dL (ref 0.0–40.0)

## 2014-05-20 MED ORDER — FLUTICASONE PROPIONATE 50 MCG/ACT NA SUSP: 1.0000 | Freq: Two times a day (BID) | NASAL | Status: DC | PRN

## 2014-05-20 NOTE — Progress Notes (Signed)
Subjective:     Leah Jones is a 53 y.o. female and is here for a comprehensive physical exam. Leah Jones is a patient of Dr Larose Kells. I am seeing her today because she has a deadline to have CPE completed for employer. I saw her several mos ago for abdominal pain. The patient reports problems - 2 episodes in 6 mos of chest tightness that lasted 1 hr, self-limited. Was stressed during both episodes. Also concerned about discoloration of foot after last steroid injection for Morton's neuroma. I reviewed MMG, womancare, vaccine status, exercise & diet habits.  History   Social History  . Marital Status: Married    Spouse Name: N/A    Number of Children: 3  . Years of Education: N/A   Occupational History  . [employer]     Social History Main Topics  . Smoking status: Current Every Day Smoker -- 1.00 packs/day    Types: Cigarettes  . Smokeless tobacco: Never Used  . Alcohol Use: Yes     Comment: socially  . Drug Use: No  . Sexual Activity: Not on file   Other Topics Concern  . 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 Tetanus/tdap  06/11/1982  . Colonoscopy  06/11/2013  . Pap Smear  12/31/2013  . Influenza Vaccine  02/19/2014  . Mammogram  01/20/2016    The following portions of the patient's history were reviewed and updated as appropriate: allergies, current medications, past medical history, past social history, past surgical history and problem list.  Review of Systems Pertinent items are noted in HPI.   Objective:    BP 106/75  Pulse 87  Temp(Src) 98.5 F (36.9 C) (Temporal)  Resp 18  Ht 5\' 6"  (1.676 m)  Wt 123 lb (55.792 kg)  BMI 19.86 kg/m2  SpO2 100% General appearance: alert, cooperative, appears stated age and no distress Head: Normocephalic, without obvious abnormality, atraumatic Eyes: negative findings: lids and lashes normal, conjunctivae and sclerae normal, corneas clear and pupils equal,  round, reactive to light and accomodation Ears: normal TM's and external ear canals both ears Throat: lips, mucosa, and tongue normal; teeth and gums normal Neck: no adenopathy, no carotid bruit, supple, symmetrical, trachea midline and thyroid not enlarged, symmetric, no tenderness/mass/nodules Lungs: clear to auscultation bilaterally Heart: regular rate and rhythm, S1, S2 normal, no murmur, click, rub or gallop Abdomen: soft, non-tender; bowel sounds normal; no masses,  no organomegaly Extremities: extremities normal, atraumatic, no cyanosis or edema Pulses: 2+ and symmetric Skin: Skin color, texture, turgor normal. No rashes or lesions Lymph nodes: no cervical or supraclavicular LAD Neurologic: Grossly normal   no cervical or supraclavicular LAD Assessment:  1. Preventative health care - Lipid panel - CBC - Vit D  25 hydroxy (rtn osteoporosis monitoring) - Comprehensive metabolic panel  2. Discoloration of skin of foot  3. Panic attack as reaction to stress  See problem list for complete A&P See pt instructions.

## 2014-05-20 NOTE — Assessment & Plan Note (Signed)
Likely due to steroid injections

## 2014-05-20 NOTE — Telephone Encounter (Signed)
Patient notified of results. Patient will pick form up at front desk.
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
## 2014-05-20 NOTE — Assessment & Plan Note (Signed)
Nml BMI Refuses vaccines Screening labs today. Employer Form will be completed when labs result.

## 2014-05-20 NOTE — Telephone Encounter (Signed)
pls call pt: Advise All labs look great, but vit d too low. Start D3 2000 iu daily w/meal. Please complete her form w/lab values & ask how she wants delivered.

## 2014-05-23 ENCOUNTER — Encounter: Payer: Self-pay | Admitting: Nurse Practitioner

## 2014-07-19 ENCOUNTER — Telehealth: Payer: Self-pay | Admitting: Cardiovascular Disease

## 2014-07-19 NOTE — Telephone Encounter (Signed)
Closed encounter °

## 2014-07-19 NOTE — Telephone Encounter (Signed)
Received records from [facility] (Dr Jenean Lindau) for appointment on 08/26/14 with Dr Gwenlyn Found.  Records given to [facility] (medical records) for Dr Kennon Holter schedule on 08/26/14.  lp

## 2014-08-25 ENCOUNTER — Telehealth: Payer: Self-pay | Admitting: Cardiovascular Disease

## 2014-08-25 NOTE — Telephone Encounter (Signed)
Close encounter 

## 2014-08-26 ENCOUNTER — Ambulatory Visit: Payer: Managed Care, Other (non HMO) | Admitting: Cardiovascular Disease

## 2014-10-07 ENCOUNTER — Ambulatory Visit: Payer: Managed Care, Other (non HMO) | Admitting: Cardiovascular Disease

## 2014-11-09 ENCOUNTER — Ambulatory Visit: Payer: Managed Care, Other (non HMO) | Admitting: Cardiovascular Disease

## 2014-12-09 ENCOUNTER — Encounter: Payer: Self-pay | Admitting: Gastroenterology

## 2015-03-09 ENCOUNTER — Emergency Department (HOSPITAL_COMMUNITY)
Admission: EM | Admit: 2015-03-09 | Discharge: 2015-03-11 | Disposition: A | Discharge status: Other Acute Inpatient Hospital | Payer: Managed Care, Other (non HMO) | Attending: Emergency Medicine | Admitting: Emergency Medicine

## 2015-03-09 DIAGNOSIS — R45851 Suicidal ideations: Secondary | ICD-10-CM | POA: Diagnosis present

## 2015-03-09 DIAGNOSIS — Z7951 Long term (current) use of inhaled steroids: Secondary | ICD-10-CM | POA: Diagnosis not present

## 2015-03-09 DIAGNOSIS — T424X2A Poisoning by benzodiazepines, intentional self-harm, initial encounter: Secondary | ICD-10-CM | POA: Diagnosis not present

## 2015-03-09 DIAGNOSIS — F909 Attention-deficit hyperactivity disorder, unspecified type: Secondary | ICD-10-CM | POA: Insufficient documentation

## 2015-03-09 DIAGNOSIS — F1510 Other stimulant abuse, uncomplicated: Secondary | ICD-10-CM | POA: Insufficient documentation

## 2015-03-09 DIAGNOSIS — Z8669 Personal history of other diseases of the nervous system and sense organs: Secondary | ICD-10-CM | POA: Insufficient documentation

## 2015-03-09 DIAGNOSIS — Y9289 Other specified places as the place of occurrence of the external cause: Secondary | ICD-10-CM | POA: Diagnosis not present

## 2015-03-09 DIAGNOSIS — Z720 Tobacco use: Secondary | ICD-10-CM | POA: Insufficient documentation

## 2015-03-09 DIAGNOSIS — Z79899 Other long term (current) drug therapy: Secondary | ICD-10-CM | POA: Insufficient documentation

## 2015-03-09 DIAGNOSIS — T50902A Poisoning by unspecified drugs, medicaments and biological substances, intentional self-harm, initial encounter: Secondary | ICD-10-CM | POA: Diagnosis not present

## 2015-03-09 DIAGNOSIS — Y998 Other external cause status: Secondary | ICD-10-CM | POA: Insufficient documentation

## 2015-03-09 DIAGNOSIS — E538 Deficiency of other specified B group vitamins: Secondary | ICD-10-CM | POA: Diagnosis not present

## 2015-03-09 DIAGNOSIS — F988 Other specified behavioral and emotional disorders with onset usually occurring in childhood and adolescence: Secondary | ICD-10-CM | POA: Diagnosis present

## 2015-03-09 DIAGNOSIS — Y9389 Activity, other specified: Secondary | ICD-10-CM | POA: Insufficient documentation

## 2015-03-09 DIAGNOSIS — F332 Major depressive disorder, recurrent severe without psychotic features: Secondary | ICD-10-CM | POA: Diagnosis not present

## 2015-03-09 DIAGNOSIS — F430 Acute stress reaction: Secondary | ICD-10-CM | POA: Diagnosis present

## 2015-03-09 DIAGNOSIS — F410 Panic disorder [episodic paroxysmal anxiety] without agoraphobia: Secondary | ICD-10-CM | POA: Diagnosis present

## 2015-03-09 DIAGNOSIS — T5192XA Toxic effect of unspecified alcohol, intentional self-harm, initial encounter: Secondary | ICD-10-CM | POA: Insufficient documentation
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
## 2015-03-09 NOTE — ED Notes (Signed)
Bed: WA17 Expected date:  Expected time:  Means of arrival:  Comments: EMS 54 yo SI-took 7 Ativan and ETOH

## 2015-03-09 NOTE — ED Provider Notes (Signed)
CSN: [id number]     Arrival date & time 03/09/15  2350 History  This chart was scribed for Everlene Balls, MD by Eustaquio Maize, ED Scribe. This patient was seen in room WA17/WA17 and the patient's care was started at 12:05 AM.  Chief Complaint  Patient presents with  . Suicide Attempt  . Drug Overdose    8 (0.5mg  Ativan Tablets with a can of beer)   The history is provided by the patient. No language interpreter was used.     HPI Comments: Leah Jones is a 54 y.o. female brought in by ambulance, who presents to the Emergency Department for suicide attempt after taking 6-7, 0.5 mg Ativan tablets with a couple of 12 oz beers tonight. When asked what happened tonight, pt states "it just got out of control." She does admit to trying to hurt herself tonight but reports she has never tried to hurt herself in the past. She mentions that her brother-in-law called EMS. Denies any other EtOH, illicit drugs, or medications taken tonight.   Past Medical History  Diagnosis Date  . ADD (attention deficit disorder)   . Carpal tunnel syndrome   . B12 deficiency    Past Surgical History  Procedure Laterality Date  . Tonsillectomy    . Rhinoplasty    . Foot surgery Left     Morton's neuroma, Dr Paulla Dolly   Family History  Problem Relation Age of Onset  . Diabetes    . Hypertension    . Colon cancer Mother   . Breast cancer Mother   . Celiac disease Mother   . Pancreatic cancer Paternal Grandfather    Social History  Substance Use Topics  . Smoking status: Current Every Day Smoker -- 1.00 packs/day    Types: Cigarettes  . Smokeless tobacco: Never Used  . Alcohol Use: Yes     Comment: socially   OB History    Gravida Para Term Preterm AB TAB SAB Ectopic Multiple Living   5 3             Review of Systems  10 Systems reviewed and all are negative for acute change except as noted in the HPI.   Allergies  Review of patient's allergies indicates no known allergies.  Home Medications    Prior to Admission medications   Medication Sig Start Date End Date Taking? Authorizing Provider  amphetamine-dextroamphetamine (ADDERALL) 20 MG tablet Take 1 tablet (20 mg total) by mouth 2 (two) times daily. 12/30/12   Colon Branch, MD  cetirizine (ZYRTEC) 10 MG tablet Take 10 mg by mouth daily.    Historical Provider, MD  dexmethylphenidate (FOCALIN) 10 MG tablet  05/09/14   Historical Provider, MD  fish oil-omega-3 fatty acids 1000 MG capsule Take 2 g by mouth daily.      Historical Provider, MD  fluticasone (FLONASE) 50 MCG/ACT nasal spray Place 1 spray into both nostrils 2 (two) times daily as needed for allergies or rhinitis. 05/20/14   Colon Branch, MD  LORazepam (ATIVAN) 0.5 MG tablet Take 0.5 mg by mouth every 8 (eight) hours.    Historical Provider, MD  Multiple Vitamin (MULTIVITAMIN) capsule Take 1 capsule by mouth daily.      Historical Provider, MD  omeprazole (PRILOSEC) 40 MG capsule Take 1 capsule (40 mg total) by mouth daily. 03/24/13   Irene Pap, NP  Probiotic Product (PROBIOTIC FORMULA PO) Take by mouth 1 dose over 46 hours.      Historical Provider, MD  valACYclovir (VALTREX) 500 MG tablet  03/18/14   Historical Provider, MD  Vitamins B1 B6 B12 LIQD Take by mouth. Drops under tongue once daily    Historical Provider, MD   Triage Vitals: BP 118/81 mmHg  Pulse 104  Temp(Src) 98.4 F (36.9 C) (Oral)  Resp 24  SpO2 97%   Physical Exam  Constitutional: She is oriented to person, place, and time. She appears well-developed and well-nourished. No distress.  Drowsy but arousable with verbal stimuli  HENT:  Head: Normocephalic and atraumatic.  Nose: Nose normal.  Mouth/Throat: Oropharynx is clear and moist. No oropharyngeal exudate.  Eyes: Conjunctivae and EOM are normal. Pupils are equal, round, and reactive to light. No scleral icterus.  Neck: Normal range of motion. Neck supple. No JVD present. No tracheal deviation present. No thyromegaly present.  Cardiovascular: Normal  rate, regular rhythm and normal heart sounds.  Exam reveals no gallop and no friction rub.   No murmur heard. Pulmonary/Chest: Effort normal and breath sounds normal. No respiratory distress. She has no wheezes. She exhibits no tenderness.  Abdominal: Soft. Bowel sounds are normal. She exhibits no distension and no mass. There is no tenderness. There is no rebound and no guarding.  Musculoskeletal: Normal range of motion. She exhibits no edema or tenderness.  Lymphadenopathy:    She has no cervical adenopathy.  Neurological: She is alert and oriented to person, place, and time. No cranial nerve deficit. She exhibits normal muscle tone.  Skin: Skin is warm and dry. No rash noted. No erythema. No pallor.  Nursing note and vitals reviewed.   ED Course  Procedures (including critical care time)  DIAGNOSTIC STUDIES: Oxygen Saturation is 97% on RA, normal by my interpretation.    COORDINATION OF CARE: 12:10 AM-Discussed treatment plan which includes CBG, CBC, CMP, Lipase, UA, EtOH, Salicylate, Acetaminophen level, rapid drug screen, and psych hold placement with pt at bedside and pt agreed to plan.   Labs Review Labs Reviewed  ETHANOL - Abnormal; Notable for the following:    Alcohol, Ethyl (B) 112 (*)    All other components within normal limits  ACETAMINOPHEN LEVEL - Abnormal; Notable for the following:    Acetaminophen (Tylenol), Serum <10 (*)    All other components within normal limits  URINE RAPID DRUG SCREEN, HOSP PERFORMED - Abnormal; Notable for the following:    Amphetamines POSITIVE (*)    All other components within normal limits  CBC WITH DIFFERENTIAL/PLATELET  COMPREHENSIVE METABOLIC PANEL  LIPASE, BLOOD  URINALYSIS, ROUTINE W REFLEX MICROSCOPIC (NOT AT [facility])  SALICYLATE LEVEL  CBG MONITORING, ED    Imaging Review No results found. I, Everlene Balls, MD, have personally reviewed and evaluated these images and lab results as part of my medical decision-making.   EKG  Interpretation   Date/Time:  Thursday March 09 2015 23:55:16 EDT Ventricular Rate:  99 PR Interval:  131 QRS Duration: 61 QT Interval:  334 QTC Calculation: 429 R Axis:   75 Text Interpretation:  Sinus rhythm Confirmed by Glynn Octave  [phone]) on 03/10/2015 12:11:26 AM      MDM   Final diagnoses:  None   Patient presents to the ED for drug overdose and clear suicide attempt.  UDS positive for amphetamines, patient states she took ativan and just several beers as well.  She was observed in the ED and medically clear.  Will consult TTS for evaluation.  ETOH is 112.  I personally performed the services described in this documentation, which was scribed  in my presence. The recorded information has been reviewed and is accurate.     Everlene Balls, MD 03/10/15 [phone]

## 2015-03-10 ENCOUNTER — Encounter (HOSPITAL_COMMUNITY): Payer: Self-pay | Admitting: Nurse Practitioner

## 2015-03-10 DIAGNOSIS — R45851 Suicidal ideations: Secondary | ICD-10-CM | POA: Diagnosis not present

## 2015-03-10 DIAGNOSIS — F332 Major depressive disorder, recurrent severe without psychotic features: Secondary | ICD-10-CM | POA: Diagnosis present

## 2015-03-10 DIAGNOSIS — T50902A Poisoning by unspecified drugs, medicaments and biological substances, intentional self-harm, initial encounter: Secondary | ICD-10-CM | POA: Diagnosis not present

## 2015-03-10 LAB — URINALYSIS, ROUTINE W REFLEX MICROSCOPIC
Bilirubin Urine: NEGATIVE
GLUCOSE, UA: NEGATIVE mg/dL
HGB URINE DIPSTICK: NEGATIVE
Ketones, ur: NEGATIVE mg/dL
LEUKOCYTES UA: NEGATIVE
Nitrite: NEGATIVE
PH: 6 (ref 5.0–8.0)
PROTEIN: NEGATIVE mg/dL
SPECIFIC GRAVITY, URINE: 1.008 (ref 1.005–1.030)
Urobilinogen, UA: 0.2 mg/dL (ref 0.0–1.0)

## 2015-03-10 LAB — COMPREHENSIVE METABOLIC PANEL
ALK PHOS: 40 U/L (ref 38–126)
ALT: 41 U/L (ref 14–54)
AST: 38 U/L (ref 15–41)
Albumin: 4 g/dL (ref 3.5–5.0)
Anion gap: 9 (ref 5–15)
BUN: 9 mg/dL (ref 6–20)
CALCIUM: 9.2 mg/dL (ref 8.9–10.3)
CHLORIDE: 107 mmol/L (ref 101–111)
CO2: 25 mmol/L (ref 22–32)
CREATININE: 0.61 mg/dL (ref 0.44–1.00)
GFR calc Af Amer: >60 mL/min (ref >60)
GFR calc non Af Amer: >60 mL/min (ref >60)
GLUCOSE: 89 mg/dL (ref 65–99)
Potassium: 4.2 mmol/L (ref 3.5–5.1)
SODIUM: 141 mmol/L (ref 135–145)
Total Bilirubin: 0.5 mg/dL (ref 0.3–1.2)
Total Protein: 6.9 g/dL (ref 6.5–8.1)

## 2015-03-10 LAB — CBC WITH DIFFERENTIAL/PLATELET
BASOS ABS: 0.1 10*3/uL (ref 0.0–0.1)
Basophils Relative: 1 % (ref 0–1)
EOS ABS: 0.2 10*3/uL (ref 0.0–0.7)
EOS PCT: 3 % (ref 0–5)
HCT: 40.1 % (ref 36.0–46.0)
HEMOGLOBIN: 13 g/dL (ref 12.0–15.0)
LYMPHS ABS: 2.2 10*3/uL (ref 0.7–4.0)
LYMPHS PCT: 37 % (ref 12–46)
MCH: 30.7 pg (ref 26.0–34.0)
MCHC: 32.4 g/dL (ref 30.0–36.0)
MCV: 94.6 fL (ref 78.0–100.0)
Monocytes Absolute: 0.6 10*3/uL (ref 0.1–1.0)
Monocytes Relative: 11 % (ref 3–12)
NEUTROS PCT: 48 % (ref 43–77)
Neutro Abs: 2.9 10*3/uL (ref 1.7–7.7)
PLATELETS: 319 10*3/uL (ref 150–400)
RBC: 4.24 MIL/uL (ref 3.87–5.11)
RDW: 12.4 % (ref 11.5–15.5)
WBC: 6 10*3/uL (ref 4.0–10.5)

## 2015-03-10 LAB — RAPID URINE DRUG SCREEN, HOSP PERFORMED
AMPHETAMINES: POSITIVE — AB
BENZODIAZEPINES: NOT DETECTED
Barbiturates: NOT DETECTED
COCAINE: NOT DETECTED
OPIATES: NOT DETECTED
Tetrahydrocannabinol: NOT DETECTED

## 2015-03-10 LAB — CBG MONITORING, ED: GLUCOSE-CAPILLARY: 93 mg/dL (ref 65–99)

## 2015-03-10 LAB — LIPASE, BLOOD: Lipase: 27 U/L (ref 22–51)

## 2015-03-10 LAB — ACETAMINOPHEN LEVEL

## 2015-03-10 LAB — SALICYLATE LEVEL: Salicylate Lvl: <4 mg/dL (ref 2.8–30.0)

## 2015-03-10 LAB — ETHANOL: Alcohol, Ethyl (B): 112 mg/dL — ABNORMAL HIGH (ref <5)

## 2015-03-10 MED ORDER — TRAZODONE HCL 50 MG PO TABS: 50.0000 mg | ORAL_TABLET | Freq: Every evening | ORAL | Status: DC | PRN

## 2015-03-10 MED ORDER — PROGESTERONE MICRONIZED 100 MG PO CAPS
100.0000 mg | ORAL_CAPSULE | Freq: Every day | ORAL | Status: DC
  Administered 2015-03-11: 100 mg via ORAL
  Filled 2015-03-10 (×2): qty 1

## 2015-03-10 MED ORDER — LORAZEPAM 0.5 MG PO TABS
0.5000 mg | ORAL_TABLET | Freq: Three times a day (TID) | ORAL | Status: DC | PRN
  Administered 2015-03-11: 0.5 mg via ORAL
  Filled 2015-03-10: qty 1

## 2015-03-10 NOTE — Consult Note (Signed)
[facility] Face-to-Face Psychiatry Consult   Reason for Consult:  Overdose Referring Physician:  EDP Patient Identification: Leah Jones MRN:  [id number] Principal Diagnosis: MDD (major depressive disorder), recurrent severe, without psychosis Diagnosis:   Patient Active Problem List   Diagnosis Date Noted  . MDD (major depressive disorder), recurrent severe, without psychosis [F33.2] 03/10/2015  . Preventative health care [Z00.00] 05/20/2014  . Discoloration of skin of foot [L81.9] 05/20/2014  . Panic attack as reaction to stress [F41.0] 05/20/2014  . Eustachian tube dysfunction [H69.80] 01/25/2014  . Allergic rhinitis [J30.9] 01/25/2014  . ADD (attention deficit disorder) [F90.9] 01/17/2012  . CARPAL TUNNEL SYNDROME [G56.00] 12/01/2009  . B12 DEFICIENCY [E53.8] 04/15/2007  . ANAL FISSURE, HX OF [Z87.2] 04/15/2007    Total Time spent with patient: 45 minutes  Subjective:   Leah Jones is a 54 y.o. female patient presented to [facility] via EMS after overdose of medication.  HPI:  Patient states that she has been under a lot of stress for a while related to her 50 yr old son.  States that yesterday she had felt that she had just had enough and took over dose and then told her husband.   Husband at bed side and states that patient takes the brunt of the responsibility with son since he travels with work a lot and she is home more. States that son is always in trouble with drugs or law but has been doing better lately.  States that his wife recently started going through menopause and has been taking a new medication for the last 3 days states that he is unsure if this has something to do with the worsening of depression. Patient has outpatient services with NP at Dr. Marquis Buggy office.  Patient has been dealing with son problems for years.  Patient has no history of suicide attempt or self injurious behavior.  Patient states that she doesn't know what came over her. At this time patient states that  she feel a little groggy, but not suicidal. Patient denies homicidal ideation, psychosis, and paranoia.  HPI Elements:   Location:  Suicide attempt. Quality:  Worsen depression. Severity:  sever. Duration:  1 day.  Past Medical History:  Past Medical History  Diagnosis Date  . ADD (attention deficit disorder)   . Carpal tunnel syndrome   . B12 deficiency     Past Surgical History  Procedure Laterality Date  . Tonsillectomy    . Rhinoplasty    . Foot surgery Left     Morton's neuroma, Dr Paulla Dolly  No family psychiatric history Family History:  Family History  Problem Relation Age of Onset  . Diabetes    . Hypertension    . Colon cancer Mother   . Breast cancer Mother   . Celiac disease Mother   . Pancreatic cancer Paternal Grandfather    Social History:  History  Alcohol Use  . Yes    Comment: socially     History  Drug Use No    Social History   Social History  . Marital Status: Married    Spouse Name: N/A  . Number of Children: 3  . Years of Education: N/A   Occupational History  . [employer]     Social History Main Topics  . Smoking status: Current Every Day Smoker -- 1.00 packs/day    Types: Cigarettes  . Smokeless tobacco: Never Used  . Alcohol Use: Yes     Comment: socially  . Drug Use: No  . Sexual Activity:  Not Asked   Other Topics Concern  . 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, 3 miscarriages   Additional Social History:    Pain Medications: None Prescriptions: ADD medication Over the Counter: PRN ASA or Tylenol History of alcohol / drug use?: Yes Withdrawal Symptoms:  (None reported) Name of Substance 1: ETOH 1 - Age of First Use: 54 years of age 40 - Amount (size/oz): Varies, could be a couple of beers or a whole bottle of Merlot 1 - Frequency: About 5D/W 1 - Duration: "Awhile." 1 - Last Use / Amount: 08/18                   Allergies:  No Known Allergies  Labs:  Results for orders placed or performed during the  hospital encounter of 03/09/15 (from the past 48 hour(s))  CBC with Differential/Platelet     Status: None   Collection Time: 03/10/15 12:15 AM  Result Value Ref Range   WBC 6.0 4.0 - 10.5 K/uL   RBC 4.24 3.87 - 5.11 MIL/uL   Hemoglobin 13.0 12.0 - 15.0 g/dL   HCT 40.1 36.0 - 46.0 %   MCV 94.6 78.0 - 100.0 fL   MCH 30.7 26.0 - 34.0 pg   MCHC 32.4 30.0 - 36.0 g/dL   RDW 12.4 11.5 - 15.5 %   Platelets 319 150 - 400 K/uL   Neutrophils Relative % 48 43 - 77 %   Neutro Abs 2.9 1.7 - 7.7 K/uL   Lymphocytes Relative 37 12 - 46 %   Lymphs Abs 2.2 0.7 - 4.0 K/uL   Monocytes Relative 11 3 - 12 %   Monocytes Absolute 0.6 0.1 - 1.0 K/uL   Eosinophils Relative 3 0 - 5 %   Eosinophils Absolute 0.2 0.0 - 0.7 K/uL   Basophils Relative 1 0 - 1 %   Basophils Absolute 0.1 0.0 - 0.1 K/uL  Comprehensive metabolic panel     Status: None   Collection Time: 03/10/15 12:15 AM  Result Value Ref Range   Sodium 141 135 - 145 mmol/L   Potassium 4.2 3.5 - 5.1 mmol/L   Chloride 107 101 - 111 mmol/L   CO2 25 22 - 32 mmol/L   Glucose, Bld 89 65 - 99 mg/dL   BUN 9 6 - 20 mg/dL   Creatinine, Ser 0.61 0.44 - 1.00 mg/dL   Calcium 9.2 8.9 - 10.3 mg/dL   Total Protein 6.9 6.5 - 8.1 g/dL   Albumin 4.0 3.5 - 5.0 g/dL   AST 38 15 - 41 U/L   ALT 41 14 - 54 U/L   Alkaline Phosphatase 40 38 - 126 U/L   Total Bilirubin 0.5 0.3 - 1.2 mg/dL   GFR calc non Af Amer >60 >60 mL/min   GFR calc Af Amer >60 >60 mL/min    Comment: (NOTE) The eGFR has been calculated using the CKD EPI equation. This calculation has not been validated in all clinical situations. eGFR's persistently <60 mL/min signify possible Chronic Kidney Disease.    Anion gap 9 5 - 15  Lipase, blood     Status: None   Collection Time: 03/10/15 12:15 AM  Result Value Ref Range   Lipase 27 22 - 51 U/L  Ethanol     Status: Abnormal   Collection Time: 03/10/15 12:15 AM  Result Value Ref Range   Alcohol, Ethyl (B) 112 (H) <5 mg/dL    Comment:  LOWEST DETECTABLE LIMIT FOR SERUM ALCOHOL IS 5 mg/dL FOR MEDICAL PURPOSES ONLY   Salicylate level     Status: None   Collection Time: 03/10/15 12:15 AM  Result Value Ref Range   Salicylate Lvl <7.1 2.8 - 30.0 mg/dL  Acetaminophen level     Status: Abnormal   Collection Time: 03/10/15 12:15 AM  Result Value Ref Range   Acetaminophen (Tylenol), Serum <10 (L) 10 - 30 ug/mL    Comment:        THERAPEUTIC CONCENTRATIONS VARY SIGNIFICANTLY. A RANGE OF 10-30 ug/mL MAY BE AN EFFECTIVE CONCENTRATION FOR MANY PATIENTS. HOWEVER, SOME ARE BEST TREATED AT CONCENTRATIONS OUTSIDE THIS RANGE. ACETAMINOPHEN CONCENTRATIONS >150 ug/mL AT 4 HOURS AFTER INGESTION AND >50 ug/mL AT 12 HOURS AFTER INGESTION ARE OFTEN ASSOCIATED WITH TOXIC REACTIONS.   CBG monitoring, ED     Status: None   Collection Time: 03/10/15 12:28 AM  Result Value Ref Range   Glucose-Capillary 93 65 - 99 mg/dL  Urinalysis, Routine w reflex microscopic (not at [facility])     Status: None   Collection Time: 03/10/15  1:16 AM  Result Value Ref Range   Color, Urine YELLOW YELLOW   APPearance CLEAR CLEAR   Specific Gravity, Urine 1.008 1.005 - 1.030   pH 6.0 5.0 - 8.0   Glucose, UA NEGATIVE NEGATIVE mg/dL   Hgb urine dipstick NEGATIVE NEGATIVE   Bilirubin Urine NEGATIVE NEGATIVE   Ketones, ur NEGATIVE NEGATIVE mg/dL   Protein, ur NEGATIVE NEGATIVE mg/dL   Urobilinogen, UA 0.2 0.0 - 1.0 mg/dL   Nitrite NEGATIVE NEGATIVE   Leukocytes, UA NEGATIVE NEGATIVE    Comment: MICROSCOPIC NOT DONE ON URINES WITH NEGATIVE PROTEIN, BLOOD, LEUKOCYTES, NITRITE, OR GLUCOSE <1000 mg/dL.  Urine rapid drug screen (hosp performed)     Status: Abnormal   Collection Time: 03/10/15  1:16 AM  Result Value Ref Range   Opiates NONE DETECTED NONE DETECTED   Cocaine NONE DETECTED NONE DETECTED   Benzodiazepines NONE DETECTED NONE DETECTED   Amphetamines POSITIVE (A) NONE DETECTED   Tetrahydrocannabinol NONE DETECTED NONE DETECTED   Barbiturates NONE  DETECTED NONE DETECTED    Comment:        DRUG SCREEN FOR MEDICAL PURPOSES ONLY.  IF CONFIRMATION IS NEEDED FOR ANY PURPOSE, NOTIFY LAB WITHIN 5 DAYS.        LOWEST DETECTABLE LIMITS FOR URINE DRUG SCREEN Drug Class       Cutoff (ng/mL) Amphetamine      1000 Barbiturate      200 Benzodiazepine   165 Tricyclics       790 Opiates          300 Cocaine          300 THC              50     Vitals: Blood pressure 110/76, pulse 78, temperature 98.5 F (36.9 C), temperature source Oral, resp. rate 16, SpO2 95 %.  Risk to Self: Suicidal Ideation: Yes-Currently Present Suicidal Intent: Yes-Currently Present Is patient at risk for suicide?: Yes Suicidal Plan?: Yes-Currently Present Specify Current Suicidal Plan: Overdose on meds Access to Means: Yes Specify Access to Suicidal Means: Medications What has been your use of drugs/alcohol within the last 12 months?: ETOH use How many times?: 0 Other Self Harm Risks: None Triggers for Past Attempts: None known Intentional Self Injurious Behavior: None Risk to Others: Homicidal Ideation: No Thoughts of Harm to Others: No Current Homicidal Intent: No Current Homicidal Plan: No  Access to Homicidal Means: No Identified Victim: No one History of harm to others?: No Assessment of Violence: None Noted Violent Behavior Description: Pt calm and cooperative Does patient have access to weapons?: No (My husband has them locked up.) Criminal Charges Pending?: No Does patient have a court date: No Prior Inpatient Therapy: Prior Inpatient Therapy: Yes Prior Therapy Dates: 12 years ago Prior Therapy Facilty/Provider(s): [facility] Reason for Treatment: medication monitoring Prior Outpatient Therapy: Prior Outpatient Therapy: Yes Prior Therapy Dates: Dr. Darleene Cleaver Prior Therapy Facilty/Provider(s): Last 6 months Reason for Treatment: N/A Does patient have an ACCT team?: No Does patient have Intensive In-House Services?  : No Does patient have [facility]  services? : No Does patient have P4CC services?: No  Current Facility-Administered Medications  Medication Dose Route Frequency Provider Last Rate Last Dose  . LORazepam (ATIVAN) tablet 0.5 mg  0.5 mg Oral Q8H PRN Josian Lanese      . traZODone (DESYREL) tablet 50 mg  50 mg Oral QHS PRN Almarosa Bohac       Current Outpatient Prescriptions  Medication Sig Dispense Refill  . amphetamine-dextroamphetamine (ADDERALL XR) 20 MG 24 hr capsule Take 1 capsule by mouth 2 (two) times daily.    Marland Kitchen amphetamine-dextroamphetamine (ADDERALL XR) 25 MG 24 hr capsule Take 1 capsule by mouth 2 (two) times daily.    Marland Kitchen amphetamine-dextroamphetamine (ADDERALL) 20 MG tablet Take 1 tablet (20 mg total) by mouth 2 (two) times daily. 60 tablet 0  . cetirizine (ZYRTEC) 10 MG tablet Take 10 mg by mouth daily.    Marland Kitchen dexmethylphenidate (FOCALIN) 10 MG tablet Take 10 mg by mouth daily.     Marland Kitchen ESTRING 2 MG vaginal ring Place 1 each vaginally every 3 (three) months.    . fish oil-omega-3 fatty acids 1000 MG capsule Take 2 g by mouth daily.      . fluticasone (FLONASE) 50 MCG/ACT nasal spray Place 1 spray into both nostrils 2 (two) times daily as needed for allergies or rhinitis. 16 g 5  . LORazepam (ATIVAN) 0.5 MG tablet Take 0.5 mg by mouth 2 (two) times daily as needed for anxiety or sleep.     . Multiple Vitamin (MULTIVITAMIN) capsule Take 1 capsule by mouth daily.      Marland Kitchen omeprazole (PRILOSEC) 40 MG capsule Take 1 capsule (40 mg total) by mouth daily. 30 capsule 1  . Probiotic Product (PROBIOTIC FORMULA PO) Take by mouth 1 dose over 46 hours.      . progesterone (PROMETRIUM) 100 MG capsule Take 1 capsule by mouth at bedtime.    . Vitamins B1 B6 B12 LIQD Take 1 drop by mouth daily. Drops under tongue once daily      Musculoskeletal: Strength & Muscle Tone: within normal limits Gait & Station: normal Patient leans: N/A  Psychiatric Specialty Exam: Physical Exam  Constitutional: She is oriented to person, place, and  time.  Neck: Normal range of motion.  Respiratory: Effort normal.  Musculoskeletal: Normal range of motion.  Neurological: She is alert and oriented to person, place, and time.  Psychiatric: Her speech is normal and behavior is normal. Her mood appears anxious. She is not actively hallucinating. Thought content is not paranoid and not delusional. Cognition and memory are normal. She expresses impulsivity. She exhibits a depressed mood. She expresses no homicidal ideation.    Review of Systems  Psychiatric/Behavioral: Positive for depression. Negative for hallucinations and substance abuse. Suicidal ideas: Denies at this time. The patient is nervous/anxious and has insomnia.  All other systems reviewed and are negative.   Blood pressure 110/76, pulse 78, temperature 98.5 F (36.9 C), temperature source Oral, resp. rate 16, SpO2 95 %.There is no weight on file to calculate BMI.  General Appearance: Casual and Fairly Groomed  Eye Contact::  Good  Speech:  Clear and Coherent and Normal Rate  Volume:  Normal  Mood:  Anxious and Depressed  Affect:  Congruent  Thought Process:  Circumstantial, Coherent and Goal Directed  Orientation:  Full (Time, Place, and Person)  Thought Content:  Denies hallucinations, delusions, and paranoia  Suicidal Thoughts:  Suicida attempt via overdose. Denies at this time  Homicidal Thoughts:  No  Memory:  Immediate;   Good Recent;   Good Remote;   Good  Judgement:  Fair  Insight:  Present  Psychomotor Activity:  Decreased  Concentration:  Fair  Recall:  Good  Fund of Knowledge:Good  Language: Good  Akathisia:  No  Handed:  Right  AIMS (if indicated):     Assets:  Communication Skills Desire for Improvement Housing Leisure Time Social Support  ADL's:  Intact  Cognition: WNL  Sleep:      Medical Decision Making: Review of Psycho-Social Stressors (1), Review or order clinical lab tests (1), Review and summation of old records (2) and Review of  Medication Regimen & Side Effects (2)  Treatment Plan Summary: Plan Observation  Plan:  Supportive therapy provided about ongoing stressors. Overnight observation   Disposition: Observation overnight.  Psychiatrist to re evaluate tomorrow.  Patient will need to follow up with outpatient provider.   Earleen Newport, FNP-BC 03/10/2015 5:58 PM Patient seen face-to-face for psychiatric evaluation, chart reviewed and case discussed with the physician extender and developed treatment plan. Reviewed the information documented and agree with the treatment plan. Corena Pilgrim, MD

## 2015-03-10 NOTE — BH Assessment (Signed)
[facility] Assessment Progress Note  Per Corena Pilgrim, MD, this pt is to be retained at [facility] overnight for further observation.  Pt is a client of his at his outpatient clinic, the [facility], and he would like to have pt scheduled for a follow up appointment.  At 13:44 I spoke to [place] at the [facility].  Pt is scheduled to be seen on Wednesday, 03/15/2015 at 15:00.  This information is included in pt's AVS under "Patient Education."  Pt's nurse has been notified.  Jalene Mullet, [place] Triage Specialist [phone]

## 2015-03-10 NOTE — ED Notes (Signed)
Called [employer] control and spoke to Shanon Brow: Symptom of sedation, CNS depression , Mild hypotension are expected. Observe pt for 6hrs rule out coingestions with basic lab work for overdose patients

## 2015-03-10 NOTE — ED Notes (Signed)
Pt resting sitter at the bedside will continue to monitor.

## 2015-03-10 NOTE — ED Notes (Signed)
Patient arrived to room 32, lethargic, does open eyes to stimuli. vss denies needs or wants
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
## 2015-03-10 NOTE — ED Notes (Signed)
Poison Control called and updated on patient condition.  Spoke with Shanon Brow from [employer]

## 2015-03-10 NOTE — BH Assessment (Signed)
[facility] Assessment Progress Note   Clinician informed nurse of patient being able to go to 300 hall when a bed is available later in the day, probably after lunch.  Voluntary admission paperwork will be completed when bed assignment is made.

## 2015-03-10 NOTE — Discharge Instructions (Signed)
For your ongoing behavioral health needs, you are advised to continue your outpatient treatment at the [facility].  You have an appointment scheduled for Wednesday, 03/05/2015 at 3:00 pm:       [id number]      [place], [place] [address]      [phone]

## 2015-03-10 NOTE — ED Notes (Signed)
Patient sleepy, " lethargic " from medications, patient unable to complete TTS consult. Will reorder when patient is awake.

## 2015-03-10 NOTE — ED Notes (Signed)
Please update husband Marizol Borror at [phone].

## 2015-03-10 NOTE — BHH Counselor (Signed)
TTS counselor called to arrange tele-assessment of pt. Per Pamala Hurry, RN in [place], pt is very sedated at the moment. Counselor asked that consult be removed and re-entered when pt is more alert.   Ramond Dial, [facility]

## 2015-03-11 ENCOUNTER — Inpatient Hospital Stay (HOSPITAL_COMMUNITY)
Admission: AD | Admit: 2015-03-11 | Discharge: 2015-03-13 | DRG: 882 | Disposition: A | Discharge status: Home / Self Care | Payer: Managed Care, Other (non HMO) | Source: Intra-hospital | Attending: Psychiatry | Admitting: Psychiatry

## 2015-03-11 ENCOUNTER — Encounter (HOSPITAL_COMMUNITY): Payer: Self-pay | Admitting: *Deleted

## 2015-03-11 DIAGNOSIS — F17210 Nicotine dependence, cigarettes, uncomplicated: Secondary | ICD-10-CM | POA: Diagnosis present

## 2015-03-11 DIAGNOSIS — F332 Major depressive disorder, recurrent severe without psychotic features: Secondary | ICD-10-CM | POA: Diagnosis not present

## 2015-03-11 DIAGNOSIS — Z800 Family history of malignant neoplasm of digestive organs: Secondary | ICD-10-CM | POA: Diagnosis not present

## 2015-03-11 DIAGNOSIS — G4700 Insomnia, unspecified: Secondary | ICD-10-CM | POA: Diagnosis present

## 2015-03-11 DIAGNOSIS — Z803 Family history of malignant neoplasm of breast: Secondary | ICD-10-CM

## 2015-03-11 DIAGNOSIS — T50902A Poisoning by unspecified drugs, medicaments and biological substances, intentional self-harm, initial encounter: Secondary | ICD-10-CM

## 2015-03-11 DIAGNOSIS — R45851 Suicidal ideations: Secondary | ICD-10-CM | POA: Diagnosis present

## 2015-03-11 DIAGNOSIS — Z8249 Family history of ischemic heart disease and other diseases of the circulatory system: Secondary | ICD-10-CM

## 2015-03-11 DIAGNOSIS — Z8379 Family history of other diseases of the digestive system: Secondary | ICD-10-CM | POA: Diagnosis not present

## 2015-03-11 DIAGNOSIS — F1510 Other stimulant abuse, uncomplicated: Secondary | ICD-10-CM | POA: Diagnosis not present

## 2015-03-11 DIAGNOSIS — F988 Other specified behavioral and emotional disorders with onset usually occurring in childhood and adolescence: Secondary | ICD-10-CM | POA: Diagnosis present

## 2015-03-11 DIAGNOSIS — F4323 Adjustment disorder with mixed anxiety and depressed mood: Principal | ICD-10-CM | POA: Diagnosis present

## 2015-03-11 DIAGNOSIS — F410 Panic disorder [episodic paroxysmal anxiety] without agoraphobia: Secondary | ICD-10-CM | POA: Diagnosis present

## 2015-03-11 DIAGNOSIS — F909 Attention-deficit hyperactivity disorder, unspecified type: Secondary | ICD-10-CM | POA: Diagnosis present

## 2015-03-11 DIAGNOSIS — Z833 Family history of diabetes mellitus: Secondary | ICD-10-CM | POA: Diagnosis not present

## 2015-03-11 MED ORDER — TRAZODONE HCL 50 MG PO TABS
50.0000 mg | ORAL_TABLET | Freq: Every evening | ORAL | Status: DC | PRN
  Administered 2015-03-12: 50 mg via ORAL
  Filled 2015-03-11: qty 3
  Filled 2015-03-11: qty 1

## 2015-03-11 MED ORDER — ACETAMINOPHEN 325 MG PO TABS
650.0000 mg | ORAL_TABLET | Freq: Four times a day (QID) | ORAL | Status: DC | PRN
  Administered 2015-03-12: 650 mg via ORAL
  Filled 2015-03-11: qty 2

## 2015-03-11 MED ORDER — NICOTINE 21 MG/24HR TD PT24
21.0000 mg | MEDICATED_PATCH | Freq: Every day | TRANSDERMAL | Status: DC
  Administered 2015-03-11 – 2015-03-13 (×3): 21 mg via TRANSDERMAL
  Filled 2015-03-11 (×3): qty 1
  Filled 2015-03-11: qty 3
  Filled 2015-03-11 (×3): qty 1

## 2015-03-11 MED ORDER — PROGESTERONE MICRONIZED 100 MG PO CAPS
100.0000 mg | ORAL_CAPSULE | Freq: Every day | ORAL | Status: DC
  Administered 2015-03-11 – 2015-03-12 (×2): 100 mg via ORAL
  Filled 2015-03-11 (×5): qty 1

## 2015-03-11 MED ORDER — LORAZEPAM 0.5 MG PO TABS
0.5000 mg | ORAL_TABLET | Freq: Three times a day (TID) | ORAL | Status: DC | PRN
  Administered 2015-03-11 – 2015-03-12 (×2): 0.5 mg via ORAL
  Filled 2015-03-11 (×2): qty 1

## 2015-03-11 MED ORDER — MAGNESIUM HYDROXIDE 400 MG/5ML PO SUSP: 30.0000 mL | Freq: Every day | ORAL | Status: DC | PRN

## 2015-03-11 MED ORDER — ALUM & MAG HYDROXIDE-SIMETH 200-200-20 MG/5ML PO SUSP: 30.0000 mL | ORAL | Status: DC | PRN

## 2015-03-11 NOTE — Progress Notes (Signed)
Did not attend group 

## 2015-03-11 NOTE — Tx Team (Addendum)
Initial Interdisciplinary Treatment Plan   PATIENT STRESSORS: Substance abuse   PATIENT STRENGTHS: Average or above average intelligence General fund of knowledge   PROBLEM LIST: Problem List/Patient Goals Date to be addressed Date deferred Reason deferred Estimated date of resolution  Depression 03/11/15     "I did something stupid" Suicide risk 03/11/2015     Anxiety  03/11/2015     "I take adderall for ADD" 03/11/2015                                    DISCHARGE CRITERIA:  Ability to meet basic life and health needs Need for constant or close observation no longer present  PRELIMINARY DISCHARGE PLAN: Return to previous living arrangement  PATIENT/FAMIILY INVOLVEMENT: This treatment plan has been presented to and reviewed with the patient, Niasha Devins, and/or family member.  The patient and family have been given the opportunity to ask questions and make suggestions.  Benancio Deeds Shanta 03/11/2015, 5:35 PM

## 2015-03-11 NOTE — Progress Notes (Signed)
Patient had several belongings by husband.  Patient's belongings that she could not keep were locked in locker # 30 and 32.

## 2015-03-11 NOTE — ED Notes (Addendum)
Pt ambulatory w/o difficulty to [facility] w/ [employer], pt did not have belongings, husband to bring them to [facility]

## 2015-03-11 NOTE — ED Notes (Signed)
Nad, resting quielty.  Pt is aware that she has been accepted to [facility] and will transport today

## 2015-03-11 NOTE — ED Notes (Addendum)
Pt is easily awakened. She stated she is tried of her son doing marjauna and xanax. Pt stated,"he is not addicted but just will not listen." pt stated,"i just get tired from it all.." pt is pleasant and was hopeful she would be able to watch her 52 year old sister and 57 year old dad sky dive in [place] this am. Pt stated,"I guess that will not happen as I am here and they plan to do this a 9am."Pt does contract for safety and denies SI and HI.9:15a-Pts husband is with the pt. 11am-pts husband at the bedside with the doctor. Pt became extremely agitated and stated,"who are you to tell me I have to stay. I can not even understand you. ""I am not staying here." Pt came out of her room and charged the door. Police and security put the pt back in her room. Pt started to curse the police officer. 11:20a Pt presently is in her room and was given .5mg  of ativan po. Pt stated,"I am missing all my work and all my family is here from out of town. I do not want my husband to know anything else about me." Pt gave the nurse a sheet with the only people she authorized for [place] to talk to. 11:30a-Pt stated,"Oh my god I have a conference I paid $1000.00 for and it is on Monday and Tuesday. I have to go." 12noon-Pt appears calm now and is resting. 1:20pm_Pt transferred to [facility] to room 38. Report was given to nurse Janie.

## 2015-03-11 NOTE — ED Notes (Signed)
On the phone 

## 2015-03-11 NOTE — ED Notes (Signed)
Report called may transport at [facility]

## 2015-03-11 NOTE — ED Notes (Signed)
[employer] here to transport

## 2015-03-11 NOTE — Consult Note (Signed)
[facility] Consult   MRN:  [id number] Principal Diagnosis: MDD (major depressive disorder), recurrent severe, without psychosis Diagnosis:   Patient Active Problem List   Diagnosis Date Noted  . MDD (major depressive disorder), recurrent severe, without psychosis [F33.2] 03/10/2015  . Suicidal ideation [R45.851]   . Preventative health care [Z00.00] 05/20/2014  . Discoloration of skin of foot [L81.9] 05/20/2014  . Panic attack as reaction to stress [F41.0] 05/20/2014  . Eustachian tube dysfunction [H69.80] 01/25/2014  . Allergic rhinitis [J30.9] 01/25/2014  . ADD (attention deficit disorder) [F90.9] 01/17/2012  . CARPAL TUNNEL SYNDROME [G56.00] 12/01/2009  . B12 DEFICIENCY [E53.8] 04/15/2007  . ANAL FISSURE, HX OF [Z87.2] 04/15/2007    Total Time spent with patient: 20 minutes  Subjective:   Leah Jones is a 54 y.o. female patient presented to [facility] via EMS after overdose of medication.   HPI:  Patient seen chart reviewed.  Patient minimizes her stressor.  She mentioned that she just want to leave so everything will be in peace.  Her husband was also in the room.  Husband has lot of concern about patient's behavior.  He believed that patient is burned out due to recent stressors.  He also feels that patient should not be given stimulants.  He believes current medicine is not working.  He does not feel that patient is safe at home.  Patient admitted that she's been drinking heavily for fast few days.  Patient was noticed crying, tearfulness and very emotional.  She admitted having hopelessness and worthlessness.  Though she denies any suicidal thoughts at this time but appears very labile, emotional.  She admitted that she took the medicine because she just wanted to end everything.  Disposition planning discussed with the patient and her husband for inpatient services and patient become very upset, agitated, loud and came out from the room and a start walking to the locker.   Security was called and patient was given lorazepam to calm her down.  Patient was able to sleep however patient remains unpredictable.  Patient denies any hallucination or any paranoia.    Past Medical History:  Past Medical History  Diagnosis Date  . ADD (attention deficit disorder)   . Carpal tunnel syndrome   . B12 deficiency     Past Surgical History  Procedure Laterality Date  . Tonsillectomy    . Rhinoplasty    . Foot surgery Left     Morton's neuroma, Dr Paulla Dolly  No family psychiatric history Family History:  Family History  Problem Relation Age of Onset  . Diabetes    . Hypertension    . Colon cancer Mother   . Breast cancer Mother   . Celiac disease Mother   . Pancreatic cancer Paternal Grandfather    Social History:  History  Alcohol Use  . Yes    Comment: socially     History  Drug Use No    Social History   Social History  . Marital Status: Married    Spouse Name: N/A  . Number of Children: 3  . Years of Education: N/A   Occupational History  . [employer]     Social History Main Topics  . Smoking status: Current Every Day Smoker -- 1.00 packs/day    Types: Cigarettes  . Smokeless tobacco: Never Used  . Alcohol Use: Yes     Comment: socially  . Drug Use: No  . Sexual Activity: Not Asked   Other Topics Concern  . 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, 3 miscarriages   Additional Social History:    Pain Medications: None Prescriptions: ADD medication Over the Counter: PRN ASA or Tylenol History of alcohol / drug use?: Yes Withdrawal Symptoms:  (None reported) Name of Substance 1: ETOH 1 - Age of First Use: 54 years of age 60 - Amount (size/oz): Varies, could be a couple of beers or a whole bottle of Merlot 1 - Frequency: About 5D/W 1 - Duration: "Awhile." 1 - Last Use / Amount: 08/18                   Allergies:  No Known Allergies  Labs:  Results for orders placed or performed during the hospital encounter of 03/09/15  (from the past 48 hour(s))  CBC with Differential/Platelet     Status: None   Collection Time: 03/10/15 12:15 AM  Result Value Ref Range   WBC 6.0 4.0 - 10.5 K/uL   RBC 4.24 3.87 - 5.11 MIL/uL   Hemoglobin 13.0 12.0 - 15.0 g/dL   HCT 40.1 36.0 - 46.0 %   MCV 94.6 78.0 - 100.0 fL   MCH 30.7 26.0 - 34.0 pg   MCHC 32.4 30.0 - 36.0 g/dL   RDW 12.4 11.5 - 15.5 %   Platelets 319 150 - 400 K/uL   Neutrophils Relative % 48 43 - 77 %   Neutro Abs 2.9 1.7 - 7.7 K/uL   Lymphocytes Relative 37 12 - 46 %   Lymphs Abs 2.2 0.7 - 4.0 K/uL   Monocytes Relative 11 3 - 12 %   Monocytes Absolute 0.6 0.1 - 1.0 K/uL   Eosinophils Relative 3 0 - 5 %   Eosinophils Absolute 0.2 0.0 - 0.7 K/uL   Basophils Relative 1 0 - 1 %   Basophils Absolute 0.1 0.0 - 0.1 K/uL  Comprehensive metabolic panel     Status: None   Collection Time: 03/10/15 12:15 AM  Result Value Ref Range   Sodium 141 135 - 145 mmol/L   Potassium 4.2 3.5 - 5.1 mmol/L   Chloride 107 101 - 111 mmol/L   CO2 25 22 - 32 mmol/L   Glucose, Bld 89 65 - 99 mg/dL   BUN 9 6 - 20 mg/dL   Creatinine, Ser 0.61 0.44 - 1.00 mg/dL   Calcium 9.2 8.9 - 10.3 mg/dL   Total Protein 6.9 6.5 - 8.1 g/dL   Albumin 4.0 3.5 - 5.0 g/dL   AST 38 15 - 41 U/L   ALT 41 14 - 54 U/L   Alkaline Phosphatase 40 38 - 126 U/L   Total Bilirubin 0.5 0.3 - 1.2 mg/dL   GFR calc non Af Amer >60 >60 mL/min   GFR calc Af Amer >60 >60 mL/min    Comment: (NOTE) The eGFR has been calculated using the CKD EPI equation. This calculation has not been validated in all clinical situations. eGFR's persistently <60 mL/min signify possible Chronic Kidney Disease.    Anion gap 9 5 - 15  Lipase, blood     Status: None   Collection Time: 03/10/15 12:15 AM  Result Value Ref Range   Lipase 27 22 - 51 U/L  Ethanol     Status: Abnormal   Collection Time: 03/10/15 12:15 AM  Result Value Ref Range   Alcohol, Ethyl (B) 112 (H) <5 mg/dL    Comment:        LOWEST DETECTABLE LIMIT  FOR SERUM ALCOHOL IS 5 mg/dL FOR  MEDICAL PURPOSES ONLY   Salicylate level     Status: None   Collection Time: 03/10/15 12:15 AM  Result Value Ref Range   Salicylate Lvl <9.3 2.8 - 30.0 mg/dL  Acetaminophen level     Status: Abnormal   Collection Time: 03/10/15 12:15 AM  Result Value Ref Range   Acetaminophen (Tylenol), Serum <10 (L) 10 - 30 ug/mL    Comment:        THERAPEUTIC CONCENTRATIONS VARY SIGNIFICANTLY. A RANGE OF 10-30 ug/mL MAY BE AN EFFECTIVE CONCENTRATION FOR MANY PATIENTS. HOWEVER, SOME ARE BEST TREATED AT CONCENTRATIONS OUTSIDE THIS RANGE. ACETAMINOPHEN CONCENTRATIONS >150 ug/mL AT 4 HOURS AFTER INGESTION AND >50 ug/mL AT 12 HOURS AFTER INGESTION ARE OFTEN ASSOCIATED WITH TOXIC REACTIONS.   CBG monitoring, ED     Status: None   Collection Time: 03/10/15 12:28 AM  Result Value Ref Range   Glucose-Capillary 93 65 - 99 mg/dL  Urinalysis, Routine w reflex microscopic (not at [facility])     Status: None   Collection Time: 03/10/15  1:16 AM  Result Value Ref Range   Color, Urine YELLOW YELLOW   APPearance CLEAR CLEAR   Specific Gravity, Urine 1.008 1.005 - 1.030   pH 6.0 5.0 - 8.0   Glucose, UA NEGATIVE NEGATIVE mg/dL   Hgb urine dipstick NEGATIVE NEGATIVE   Bilirubin Urine NEGATIVE NEGATIVE   Ketones, ur NEGATIVE NEGATIVE mg/dL   Protein, ur NEGATIVE NEGATIVE mg/dL   Urobilinogen, UA 0.2 0.0 - 1.0 mg/dL   Nitrite NEGATIVE NEGATIVE   Leukocytes, UA NEGATIVE NEGATIVE    Comment: MICROSCOPIC NOT DONE ON URINES WITH NEGATIVE PROTEIN, BLOOD, LEUKOCYTES, NITRITE, OR GLUCOSE <1000 mg/dL.  Urine rapid drug screen (hosp performed)     Status: Abnormal   Collection Time: 03/10/15  1:16 AM  Result Value Ref Range   Opiates NONE DETECTED NONE DETECTED   Cocaine NONE DETECTED NONE DETECTED   Benzodiazepines NONE DETECTED NONE DETECTED   Amphetamines POSITIVE (A) NONE DETECTED   Tetrahydrocannabinol NONE DETECTED NONE DETECTED   Barbiturates NONE DETECTED NONE DETECTED     Comment:        DRUG SCREEN FOR MEDICAL PURPOSES ONLY.  IF CONFIRMATION IS NEEDED FOR ANY PURPOSE, NOTIFY LAB WITHIN 5 DAYS.        LOWEST DETECTABLE LIMITS FOR URINE DRUG SCREEN Drug Class       Cutoff (ng/mL) Amphetamine      1000 Barbiturate      200 Benzodiazepine   235 Tricyclics       573 Opiates          300 Cocaine          300 THC              50     Vitals: Blood pressure 109/71, pulse 79, temperature 99.2 F (37.3 C), temperature source Axillary, resp. rate 18, SpO2 99 %.  Risk to Self: Suicidal Ideation: Yes-Currently Present Suicidal Intent: Yes-Currently Present Is patient at risk for suicide?: Yes Suicidal Plan?: Yes-Currently Present Specify Current Suicidal Plan: Overdose on meds Access to Means: Yes Specify Access to Suicidal Means: Medications What has been your use of drugs/alcohol within the last 12 months?: ETOH use How many times?: 0 Other Self Harm Risks: None Triggers for Past Attempts: None known Intentional Self Injurious Behavior: None Risk to Others: Homicidal Ideation: No Thoughts of Harm to Others: No Current Homicidal Intent: No Current Homicidal Plan: No Access to Homicidal Means: No Identified Victim: No one History  of harm to others?: No Assessment of Violence: None Noted Violent Behavior Description: Pt calm and cooperative Does patient have access to weapons?: No (My husband has them locked up.) Criminal Charges Pending?: No Does patient have a court date: No Prior Inpatient Therapy: Prior Inpatient Therapy: Yes Prior Therapy Dates: 12 years ago Prior Therapy Facilty/Provider(s): [facility] Reason for Treatment: medication monitoring Prior Outpatient Therapy: Prior Outpatient Therapy: Yes Prior Therapy Dates: Dr. Darleene Cleaver Prior Therapy Facilty/Provider(s): Last 6 months Reason for Treatment: N/A Does patient have an ACCT team?: No Does patient have Intensive In-House Services?  : No Does patient have [facility] services? : No Does  patient have P4CC services?: No  Current Facility-Administered Medications  Medication Dose Route Frequency Provider Last Rate Last Dose  . LORazepam (ATIVAN) tablet 0.5 mg  0.5 mg Oral Q8H PRN Mojeed Akintayo   0.5 mg at 03/11/15 1124  . progesterone (PROMETRIUM) capsule 100 mg  100 mg Oral QHS Harriet Butte, NP   100 mg at 03/11/15 0013  . traZODone (DESYREL) tablet 50 mg  50 mg Oral QHS PRN Mojeed Akintayo       Current Outpatient Prescriptions  Medication Sig Dispense Refill  . amphetamine-dextroamphetamine (ADDERALL XR) 20 MG 24 hr capsule Take 1 capsule by mouth 2 (two) times daily.    Marland Kitchen amphetamine-dextroamphetamine (ADDERALL XR) 25 MG 24 hr capsule Take 1 capsule by mouth 2 (two) times daily.    Marland Kitchen amphetamine-dextroamphetamine (ADDERALL) 20 MG tablet Take 1 tablet (20 mg total) by mouth 2 (two) times daily. 60 tablet 0  . cetirizine (ZYRTEC) 10 MG tablet Take 10 mg by mouth daily.    Marland Kitchen dexmethylphenidate (FOCALIN) 10 MG tablet Take 10 mg by mouth daily.     Marland Kitchen ESTRING 2 MG vaginal ring Place 1 each vaginally every 3 (three) months.    . fish oil-omega-3 fatty acids 1000 MG capsule Take 2 g by mouth daily.      . fluticasone (FLONASE) 50 MCG/ACT nasal spray Place 1 spray into both nostrils 2 (two) times daily as needed for allergies or rhinitis. 16 g 5  . LORazepam (ATIVAN) 0.5 MG tablet Take 0.5 mg by mouth 2 (two) times daily as needed for anxiety or sleep.     . Multiple Vitamin (MULTIVITAMIN) capsule Take 1 capsule by mouth daily.      Marland Kitchen omeprazole (PRILOSEC) 40 MG capsule Take 1 capsule (40 mg total) by mouth daily. 30 capsule 1  . Probiotic Product (PROBIOTIC FORMULA PO) Take by mouth 1 dose over 46 hours.      . progesterone (PROMETRIUM) 100 MG capsule Take 1 capsule by mouth at bedtime.    . Vitamins B1 B6 B12 LIQD Take 1 drop by mouth daily. Drops under tongue once daily      Musculoskeletal: Strength & Muscle Tone: within normal limits Gait & Station: normal Patient  leans: N/A  Psychiatric Specialty Exam: Physical Exam  Constitutional: She is oriented to person, place, and time.  Neck: Normal range of motion.  Respiratory: Effort normal.  Musculoskeletal: Normal range of motion.  Neurological: She is alert and oriented to person, place, and time.  Psychiatric: Her speech is normal and behavior is normal. Her mood appears anxious. She is not actively hallucinating. Thought content is not paranoid and not delusional. Cognition and memory are normal. She expresses impulsivity. She exhibits a depressed mood. She expresses no homicidal ideation.    Review of Systems  Psychiatric/Behavioral: Positive for depression. Negative for hallucinations and substance  abuse. Suicidal ideas: Denies at this time. The patient is nervous/anxious and has insomnia.   All other systems reviewed and are negative.   Blood pressure 109/71, pulse 79, temperature 99.2 F (37.3 C), temperature source Axillary, resp. rate 18, SpO2 99 %.There is no weight on file to calculate BMI.  General Appearance: Guarded and Emotional and poor eye contact.  Eye Contact::  Poor  Speech:  Pressured  Volume:  Increased  Mood:  Angry, Anxious, Depressed and Irritable  Affect:  Congruent  Thought Process:  Circumstantial, Coherent and Goal Directed  Orientation:  Full (Time, Place, and Person)  Thought Content:  Denies hallucinations, delusions, and paranoia  Suicidal Thoughts:  Suicida attempt via overdose. Denies at this time  Homicidal Thoughts:  No  Memory:  Immediate;   Good Recent;   Good Remote;   Good  Judgement:  Impaired  Insight:  Lacking  Psychomotor Activity:  Increased and Restlessness  Concentration:  Fair  Recall:  Good  Fund of Knowledge:Good  Language: Good  Akathisia:  No  Handed:  Right  AIMS (if indicated):     Assets:  Communication Skills Desire for Improvement Housing Leisure Time Social Support  ADL's:  Intact  Cognition: WNL  Sleep:      Medical  Decision Making: Review of Psycho-Social Stressors (1), Review or order clinical lab tests (1), Review and summation of old records (2), Established Problem, Worsening (2) and Review of Medication Regimen & Side Effects (2)  Treatment Plan Summary: Patient requires inpatient psychiatric treatment for stabilization and treatment.  Plan:  Recommend psychiatric Inpatient admission when medically cleared.    Lakrisha Iseman T., FNP-BC 03/11/2015 1:48 PM

## 2015-03-11 NOTE — Progress Notes (Signed)
D: Patient in the hallway on approach.  Patient states she suffered from anxiety.  Patient states she feels she did something stupid and is now ready to go home.  Patient states she has a meeting for work on Monday that her employer paid $1000.00 for.  Patient does appear anxious.  Patient denies SI/HI and denies AVH.   A: Staff to monitor Q 15 mins for safety.  Encouragement and support offered.  Scheduled medications administered per orders.  Ativan administered prn for for anxiety.  R: Patient remains safe on the unit.  Patient did not attend group tonight.  Patient visible on the unit.  Patient taking administered medications.

## 2015-03-11 NOTE — Progress Notes (Signed)
54 year old white female admitted after she presented to [facility] after a reported overdose on Ativan and alcohol. At time of admission when patient was asked why she was at [facility] she reported " good question, I should not be here." Pt then started to cry. Pt reported that she was missing so much and that she should be with her dad that just turned 39. Pt reported that she did take 5 Ativan and drank beer in hopes that she would not wake up, but now is having lots of regrets.   Pt reported that her depression was a 10, and her hopelessness was a 10. Pt reported that she has a lot of anger and has no idea how to get it out. Pt reported being negative SI/HI, no AH/VH noted.

## 2015-03-11 NOTE — ED Notes (Signed)
[facility] will call back for report 

## 2015-03-11 NOTE — ED Notes (Signed)
[employer] contracted for transport

## 2015-03-12 ENCOUNTER — Encounter (HOSPITAL_COMMUNITY): Payer: Self-pay | Admitting: Registered Nurse

## 2015-03-12 DIAGNOSIS — F4323 Adjustment disorder with mixed anxiety and depressed mood: Secondary | ICD-10-CM | POA: Diagnosis present

## 2015-03-12 DIAGNOSIS — F909 Attention-deficit hyperactivity disorder, unspecified type: Secondary | ICD-10-CM

## 2015-03-12 LAB — TSH: TSH: 1.908 u[IU]/mL (ref 0.350–4.500)

## 2015-03-12 MED ORDER — AMPHETAMINE-DEXTROAMPHET ER 10 MG PO CP24
20.0000 mg | ORAL_CAPSULE | Freq: Two times a day (BID) | ORAL | Status: DC
  Administered 2015-03-12 – 2015-03-13 (×2): 20 mg via ORAL
  Filled 2015-03-12 (×2): qty 2
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
## 2015-03-12 NOTE — BHH Suicide Risk Assessment (Addendum)
[facility] Admission Suicide Risk Assessment   Nursing information obtained from:  Patient Demographic factors:  Caucasian Current Mental Status:  NA Loss Factors:  NA Historical Factors:  NA Risk Reduction Factors:  Living with another person, especially a relative Total Time spent with patient: 30 minutes Principal Problem: Adjustment disorder with mixed anxiety and depressed mood Diagnosis:   Patient Active Problem List   Diagnosis Date Noted  . Adjustment disorder with mixed anxiety and depressed mood [F43.23] 03/12/2015  . Suicidal ideation [R45.851]   . Preventative health care [Z00.00] 05/20/2014  . Discoloration of skin of foot [L81.9] 05/20/2014  . Panic attack as reaction to stress [F41.0] 05/20/2014  . Eustachian tube dysfunction [H69.80] 01/25/2014  . Allergic rhinitis [J30.9] 01/25/2014  . ADD (attention deficit disorder) [F90.9] 01/17/2012  . CARPAL TUNNEL SYNDROME [G56.00] 12/01/2009  . B12 DEFICIENCY [E53.8] 04/15/2007  . ANAL FISSURE, HX OF [Z87.2] 04/15/2007     Continued Clinical Symptoms:  Alcohol Use Disorder Identification Test Final Score (AUDIT): 4 The "Alcohol Use Disorders Identification Test", Guidelines for Use in Primary Care, Second Edition.  World Pharmacologist [facility]). Score between 0-7:  no or low risk or alcohol related problems. Score between 8-15:  moderate risk of alcohol related problems. Score between 16-19:  high risk of alcohol related problems. Score 20 or above:  warrants further diagnostic evaluation for alcohol dependence and treatment.   CLINICAL FACTORS:   Previous Psychiatric Diagnoses and Treatments   Musculoskeletal: Strength & Muscle Tone: within normal limits Gait & Station: normal Patient leans: N/A  Psychiatric Specialty Exam: Physical Exam  Review of Systems  Psychiatric/Behavioral: The patient is nervous/anxious. The patient does not have insomnia.   All other systems reviewed and are negative.   Blood pressure  125/86, pulse 89, temperature 98.2 F (36.8 C), temperature source Oral, resp. rate 16, height 5\' 6"  (1.676 m), weight 55.792 kg (123 lb).Body mass index is 19.86 kg/(m^2).  General Appearance: Casual  Eye Contact::  Fair  Speech:  Clear and Coherent  Volume:  Normal  Mood:  Anxious  Affect:  Appropriate  Thought Process:  Coherent  Orientation:  Full (Time, Place, and Person)  Thought Content:  WDL  Suicidal Thoughts:  No  Homicidal Thoughts:  No  Memory:  Immediate;   Fair Recent;   Fair Remote;   Fair  Judgement:  Fair  Insight:  Fair  Psychomotor Activity:  Normal  Concentration:  Fair  Recall:  [employer] of Knowledge:Fair  Language: Fair  Akathisia:  No  Handed:  Right  AIMS (if indicated):     Assets:  Communication Skills Desire for Improvement Physical Health  Sleep:  Number of Hours: 5  Cognition: WNL  ADL's:  Intact     COGNITIVE FEATURES THAT CONTRIBUTE TO RISK:  Closed-mindedness    SUICIDE RISK:   Mild:  Suicidal ideation of limited frequency, intensity, duration, and specificity.  There are no identifiable plans, no associated intent, mild dysphoria and related symptoms, good self-control (both objective and subjective assessment), few other risk factors, and identifiable protective factors, including available and accessible social support.  PLAN OF CARE: Patient will benefit from inpatient treatment and stabilization.  Estimated length of stay is 5-7 days.  Reviewed past medical records,treatment plan.  Patient states she only took 4-5 pills of 0.5 mg ativan just to get some sleep due to all her stress from her 52 year old as well as her own hormonal issues. Pt denies any SI/AH/VH/HI. Discussed that if  she continues to remain stable and after obtaining collateral info from family - she may possibly be discharged tomorrow . Restart Home medications. Pt is not interested in any other medication options at this time. Will continue to monitor vitals  ,medication compliance and treatment side effects while patient is here.  Will monitor for medical issues as well as call consult as needed.  Reviewed labs BAL>100 , UDS pos- stimulants ( she reports she is being prescribed by Dr.Akintayo )  ,will order TSH.  CSW will start working on disposition.  Patient to participate in therapeutic milieu .       Medical Decision Making:  Review of Psycho-Social Stressors (1), Discuss test with performing physician (1), Established Problem, Worsening (2), Review of Medication Regimen & Side Effects (2) and Review of New Medication or Change in Dosage (2)  I certify that inpatient services furnished can reasonably be expected to improve the patient's condition.   Oleg Oleson MD 03/12/2015, 12:00 PM

## 2015-03-12 NOTE — Progress Notes (Signed)
Tonight in wrap up group Leah Jones said her day was an 8 she was pleasantly surprised by her sisters visit, she lives in [place] and made a trip here to see her while she was here at [facility]. She was also glad to be outside today and get fresh air.

## 2015-03-12 NOTE — BHH Group Notes (Addendum)
[facility] Group Notes:  (Nursing/MHT/Case Management/Adjunct)  Date:  03/12/2015  Time:  0930  Type of Therapy:  Active  Participation Level:  Enaged  Participation Quality:  Attentive  Affect:  Appropriate  Cognitive:  Alert  Insight:  Good  Engagement in Group:  Engaged  Modes of Intervention:  Education  Summary of Progress/Problems: Patient attended and listened to nurse education group. Discussed upcoming goals and plans.  Loletta Specter [facility] 03/12/2015, 1000

## 2015-03-12 NOTE — H&P (Signed)
Psychiatric Admission Assessment Adult  Patient Identification: Leah Jones MRN:  [id number] Date of Evaluation:  03/12/2015 Chief Complaint:  MDD RECURRENT SEVERE Principal Diagnosis: Adjustment disorder with mixed anxiety and depressed mood Diagnosis:   Patient Active Problem List   Diagnosis Date Noted  . Adjustment disorder with mixed anxiety and depressed mood [F43.23] 03/12/2015  . Suicidal ideation [R45.851]   . Preventative health care [Z00.00] 05/20/2014  . Discoloration of skin of foot [L81.9] 05/20/2014  . Panic attack as reaction to stress [F41.0] 05/20/2014  . Eustachian tube dysfunction [H69.80] 01/25/2014  . Allergic rhinitis [J30.9] 01/25/2014  . ADD (attention deficit disorder) [F90.9] 01/17/2012  . CARPAL TUNNEL SYNDROME [G56.00] 12/01/2009  . B12 DEFICIENCY [E53.8] 04/15/2007  . ANAL FISSURE, HX OF [Z87.2] 04/15/2007   History of Present Illness:: Patient presented to [facility] after an overdose.  Patient states that stressors are her 52 yr old son who gets into a lot of trouble and changes in her medication related to possible menopausal changes.  At this time patient denies suicidal ideation; but states that she did take overdose because she had enough and wanted to end everything.  Patient states that she feelings of hopelessness and worthlessness.  Patient denies any hallucination or any paranoia.  Patient has outpatient services with Dr. Marquis Buggy office.  Patient has been dealing with the stressors of her son for a long time and states that she has never done anything like this. Patient denies homicidal ideation, psychosis, and paranoia.    Elements:  Location:  Worsening depression. Quality:  Overdose. Severity:  Sever. Duration:  Several days. Associated Signs/Symptoms: Depression Symptoms:  depressed mood, fatigue, feelings of worthlessness/guilt, difficulty concentrating, hopelessness, suicidal thoughts with specific plan, suicidal attempt, anxiety, panic  attacks, loss of energy/fatigue, decreased appetite, (Hypo) Manic Symptoms:  Impulsivity, Irritable Mood, Labiality of Mood, Anxiety Symptoms:  Excessive Worry, Panic Symptoms, Psychotic Symptoms:  Denies PTSD Symptoms: Denies Total Time spent with patient: 1 hour  Past Medical History:  Past Medical History  Diagnosis Date  . ADD (attention deficit disorder)   . Carpal tunnel syndrome   . B12 deficiency     Past Surgical History  Procedure Laterality Date  . Tonsillectomy    . Rhinoplasty    . Foot surgery Left     Morton's neuroma, Dr Paulla Dolly   Family History:  Family History  Problem Relation Age of Onset  . Diabetes    . Hypertension    . Colon cancer Mother   . Breast cancer Mother   . Celiac disease Mother   . Pancreatic cancer Paternal Grandfather    Social History:  History  Alcohol Use  . Yes    Comment: socially     History  Drug Use No    Social History   Social History  . Marital Status: Married    Spouse Name: N/A  . Number of Children: 3  . Years of Education: N/A   Occupational History  . [employer]     Social History Main Topics  . Smoking status: Current Every Day Smoker -- 1.00 packs/day    Types: Cigarettes  . Smokeless tobacco: Never Used  . Alcohol Use: Yes     Comment: socially  . Drug Use: No  . Sexual Activity: Not Asked   Other Topics Concern  . 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  Neck: Normal range of motion.  Respiratory: Effort normal.  Musculoskeletal: Normal  range of motion.  Neurological: She is alert and oriented to person, place, and time.  Psychiatric: Her speech is normal and behavior is normal. Thought content normal. Her mood appears anxious.    Review of Systems  Psychiatric/Behavioral: Negative for hallucinations and substance abuse. Depression: Denies. Suicidal ideas: Denies at this time. The patient is nervous/anxious. Insomnia: Denies.   All other systems reviewed and are negative.   Blood pressure 125/86, pulse 89, temperature 98.2 F (36.8 C), temperature source Oral, resp. rate 16, height 5' 6"  (1.676 m), weight 55.792 kg (123 lb).Body mass index is 19.86 kg/(m^2).  General Appearance: Casual and Fairly Groomed  Engineer, water::  Good  Speech:  Clear and Coherent and Normal Rate  Volume:  Normal  Mood:  Anxious  Affect:  Appropriate  Thought Process:  Coherent  Orientation:  Full (Time, Place, and Person)  Thought Content:  WDL  Suicidal Thoughts:  Denies at this time; Post suicide attempt  Homicidal Thoughts:  No  Memory:  Immediate;   Fair Recent;   Fair Remote;   Fair  Judgement:  Fair  Insight:  Fair  Psychomotor Activity:  Normal  Concentration:  Fair  Recall:  [employer] of Knowledge:Fair  Language: Fair  Akathisia:  No  Handed:  Right  AIMS (if indicated):     Assets:  Communication Skills Desire for Improvement Housing Social Support  ADL's:  Intact  Cognition: WNL  Sleep:  Number of Hours: 5   Risk to Self: Is patient at risk for suicide?: No Risk to Others:   Prior Inpatient Therapy:   Prior Outpatient Therapy:    Alcohol Screening: 1. How often do you have a drink containing alcohol?: 4 or more times a week 2. How many drinks containing alcohol do you have on a typical day when you are drinking?: 1 or 2 3. How often do you have six or more drinks on one occasion?: Never Preliminary Score: 0 9. Have you or someone else been injured as a result of your drinking?: No 10. Has a relative or friend or  a doctor or another health worker been concerned about your drinking or suggested you cut down?: No Alcohol Use Disorder Identification Test Final Score (AUDIT): 4 Brief Intervention: AUDIT score less than 7 or less-screening does not suggest unhealthy drinking-brief intervention not indicated  Allergies:  No Known Allergies Lab Results: No results found for this or any previous visit (from the past 43 hour(s)). Current Medications: Current Facility-Administered Medications  Medication Dose Route Frequency Provider Last Rate Last Dose  . acetaminophen (TYLENOL) tablet 650 mg  650 mg Oral Q6H PRN Delfin Gant, NP   650 mg at 03/12/15 0507  . alum & mag hydroxide-simeth (MAALOX/MYLANTA) 200-200-20 MG/5ML suspension 30 mL  30 mL Oral Q4H PRN Delfin Gant, NP      . amphetamine-dextroamphetamine (ADDERALL XR) 24 hr capsule 20 mg  20 mg Oral BID Ursula Alert, MD   20 mg at 03/12/15 1200  . LORazepam (ATIVAN) tablet 0.5 mg  0.5 mg Oral Q8H PRN Delfin Gant, NP   0.5 mg at 03/11/15 2213  .  magnesium hydroxide (MILK OF MAGNESIA) suspension 30 mL  30 mL Oral Daily PRN Delfin Gant, NP      . nicotine (NICODERM CQ - dosed in mg/24 hours) patch 21 mg  21 mg Transdermal Daily Weslyn Holsonback B Kemuel Buchmann, NP   21 mg at 03/12/15 0821  . progesterone (PROMETRIUM) capsule 100 mg  100 mg Oral QHS Delfin Gant, NP   100 mg at 03/11/15 2240  . traZODone (DESYREL) tablet 50 mg  50 mg Oral QHS PRN Delfin Gant, NP   50 mg at 03/12/15 0115   PTA Medications: Prescriptions prior to admission  Medication Sig Dispense Refill Last Dose  . amphetamine-dextroamphetamine (ADDERALL XR) 20 MG 24 hr capsule Take 1 capsule by mouth 2 (two) times daily.   Unknown at Unknown time  . cetirizine (ZYRTEC) 10 MG tablet Take 10 mg by mouth daily.   Unknown at Unknown time  . dexmethylphenidate (FOCALIN) 10 MG tablet Take 10 mg by mouth daily.    Unknown at Unknown time  . ESTRING 2 MG vaginal ring Place 1  each vaginally every 3 (three) months.   Unknown at Unknown time  . fish oil-omega-3 fatty acids 1000 MG capsule Take 2 g by mouth daily.     Unknown at Unknown time  . fluticasone (FLONASE) 50 MCG/ACT nasal spray Place 1 spray into both nostrils 2 (two) times daily as needed for allergies or rhinitis. 16 g 5 Unknown at Unknown time  . LORazepam (ATIVAN) 0.5 MG tablet Take 0.5 mg by mouth 2 (two) times daily as needed for anxiety or sleep.    Unknown at Unknown time  . Multiple Vitamin (MULTIVITAMIN) capsule Take 1 capsule by mouth daily.     Unknown at Unknown time  . omeprazole (PRILOSEC) 40 MG capsule Take 1 capsule (40 mg total) by mouth daily. 30 capsule 1 Past Month at Unknown time  . Probiotic Product (PROBIOTIC FORMULA PO) Take by mouth 1 dose over 46 hours.     Unknown at Unknown time  . progesterone (PROMETRIUM) 100 MG capsule Take 1 capsule by mouth at bedtime.   03/09/2015 at Unknown time  . Vitamins B1 B6 B12 LIQD Take 1 drop by mouth daily. Drops under tongue once daily   Unknown at Unknown time    Previous Psychotropic Medications: Yes   Substance Abuse History in the last 12 months:  No.    Consequences of Substance Abuse: NA  Results for orders placed or performed during the hospital encounter of 03/09/15 (from the past 72 hour(s))  CBC with Differential/Platelet     Status: None   Collection Time: 03/10/15 12:15 AM  Result Value Ref Range   WBC 6.0 4.0 - 10.5 K/uL   RBC 4.24 3.87 - 5.11 MIL/uL   Hemoglobin 13.0 12.0 - 15.0 g/dL   HCT 40.1 36.0 - 46.0 %   MCV 94.6 78.0 - 100.0 fL   MCH 30.7 26.0 - 34.0 pg   MCHC 32.4 30.0 - 36.0 g/dL   RDW 12.4 11.5 - 15.5 %   Platelets 319 150 - 400 K/uL   Neutrophils Relative % 48 43 - 77 %   Neutro Abs 2.9 1.7 - 7.7 K/uL   Lymphocytes Relative 37 12 - 46 %   Lymphs Abs 2.2 0.7 - 4.0 K/uL   Monocytes Relative 11 3 - 12 %   Monocytes Absolute 0.6 0.1 - 1.0 K/uL   Eosinophils Relative 3 0 - 5 %   Eosinophils Absolute  0.2 0.0 -  0.7 K/uL   Basophils Relative 1 0 - 1 %   Basophils Absolute 0.1 0.0 - 0.1 K/uL  Comprehensive metabolic panel     Status: None   Collection Time: 03/10/15 12:15 AM  Result Value Ref Range   Sodium 141 135 - 145 mmol/L   Potassium 4.2 3.5 - 5.1 mmol/L   Chloride 107 101 - 111 mmol/L   CO2 25 22 - 32 mmol/L   Glucose, Bld 89 65 - 99 mg/dL   BUN 9 6 - 20 mg/dL   Creatinine, Ser 0.61 0.44 - 1.00 mg/dL   Calcium 9.2 8.9 - 10.3 mg/dL   Total Protein 6.9 6.5 - 8.1 g/dL   Albumin 4.0 3.5 - 5.0 g/dL   AST 38 15 - 41 U/L   ALT 41 14 - 54 U/L   Alkaline Phosphatase 40 38 - 126 U/L   Total Bilirubin 0.5 0.3 - 1.2 mg/dL   GFR calc non Af Amer >60 >60 mL/min   GFR calc Af Amer >60 >60 mL/min    Comment: (NOTE) The eGFR has been calculated using the CKD EPI equation. This calculation has not been validated in all clinical situations. eGFR's persistently <60 mL/min signify possible Chronic Kidney Disease.    Anion gap 9 5 - 15  Lipase, blood     Status: None   Collection Time: 03/10/15 12:15 AM  Result Value Ref Range   Lipase 27 22 - 51 U/L  Ethanol     Status: Abnormal   Collection Time: 03/10/15 12:15 AM  Result Value Ref Range   Alcohol, Ethyl (B) 112 (H) <5 mg/dL    Comment:        LOWEST DETECTABLE LIMIT FOR SERUM ALCOHOL IS 5 mg/dL FOR MEDICAL PURPOSES ONLY   Salicylate level     Status: None   Collection Time: 03/10/15 12:15 AM  Result Value Ref Range   Salicylate Lvl <6.9 2.8 - 30.0 mg/dL  Acetaminophen level     Status: Abnormal   Collection Time: 03/10/15 12:15 AM  Result Value Ref Range   Acetaminophen (Tylenol), Serum <10 (L) 10 - 30 ug/mL    Comment:        THERAPEUTIC CONCENTRATIONS VARY SIGNIFICANTLY. A RANGE OF 10-30 ug/mL MAY BE AN EFFECTIVE CONCENTRATION FOR MANY PATIENTS. HOWEVER, SOME ARE BEST TREATED AT CONCENTRATIONS OUTSIDE THIS RANGE. ACETAMINOPHEN CONCENTRATIONS >150 ug/mL AT 4 HOURS AFTER INGESTION AND >50 ug/mL AT 12 HOURS AFTER INGESTION  ARE OFTEN ASSOCIATED WITH TOXIC REACTIONS.   CBG monitoring, ED     Status: None   Collection Time: 03/10/15 12:28 AM  Result Value Ref Range   Glucose-Capillary 93 65 - 99 mg/dL  Urinalysis, Routine w reflex microscopic (not at [facility])     Status: None   Collection Time: 03/10/15  1:16 AM  Result Value Ref Range   Color, Urine YELLOW YELLOW   APPearance CLEAR CLEAR   Specific Gravity, Urine 1.008 1.005 - 1.030   pH 6.0 5.0 - 8.0   Glucose, UA NEGATIVE NEGATIVE mg/dL   Hgb urine dipstick NEGATIVE NEGATIVE   Bilirubin Urine NEGATIVE NEGATIVE   Ketones, ur NEGATIVE NEGATIVE mg/dL   Protein, ur NEGATIVE NEGATIVE mg/dL   Urobilinogen, UA 0.2 0.0 - 1.0 mg/dL   Nitrite NEGATIVE NEGATIVE   Leukocytes, UA NEGATIVE NEGATIVE    Comment: MICROSCOPIC NOT DONE ON URINES WITH NEGATIVE PROTEIN, BLOOD, LEUKOCYTES, NITRITE, OR GLUCOSE <1000 mg/dL.  Urine rapid drug screen (hosp performed)  Status: Abnormal   Collection Time: 03/10/15  1:16 AM  Result Value Ref Range   Opiates NONE DETECTED NONE DETECTED   Cocaine NONE DETECTED NONE DETECTED   Benzodiazepines NONE DETECTED NONE DETECTED   Amphetamines POSITIVE (A) NONE DETECTED   Tetrahydrocannabinol NONE DETECTED NONE DETECTED   Barbiturates NONE DETECTED NONE DETECTED    Comment:        DRUG SCREEN FOR MEDICAL PURPOSES ONLY.  IF CONFIRMATION IS NEEDED FOR ANY PURPOSE, NOTIFY LAB WITHIN 5 DAYS.        LOWEST DETECTABLE LIMITS FOR URINE DRUG SCREEN Drug Class       Cutoff (ng/mL) Amphetamine      1000 Barbiturate      200 Benzodiazepine   800 Tricyclics       349 Opiates          300 Cocaine          300 THC              50     Observation Level/Precautions:  15 minute checks  Laboratory:  CBC Chemistry Profile UA  Psychotherapy:  Individual and group session  Medications:  Medications will be started as appropriate for patient stabilization  Consultations:  Psychiatry  Discharge Concerns:  Safety, stabilization, and risk of  access to medication and medication stabilization   Estimated LOS:  5-7 days  Other:     Psychological Evaluations: Yes   Treatment Plan Summary: Daily contact with patient to assess and evaluate symptoms and progress in treatment and Medication management  PLAN OF CARE:  Patient will benefit from inpatient treatment and stabilization.  Estimated length of stay is 5-7 days.  Reviewed past medical records,treatment plan.  Patient states she only took 4-5 pills of 0.5 mg ativan just to get some sleep due to all her stress from her 52 year old as well as her own hormonal issues. Pt denies any SI/AH/VH/HI. Discussed that if she continues to remain stable and after obtaining collateral info from family - she may possibly be discharged tomorrow . Restart Home medications. Pt is not interested in any other medication options at this time. Will continue to monitor vitals ,medication compliance and treatment side effects while patient is here.  Will monitor for medical issues as well as call consult as needed.  Reviewed labs BAL>100 , UDS pos- stimulants ( she reports she is being prescribed by Dr.Akintayo ) ,will order TSH.  CSW will start working on disposition.  Patient to participate in therapeutic milieu .    Medical Decision Making:  Review of Psycho-Social Stressors (1), Review or order clinical lab tests (1), Review and summation of old records (2), Review of Last Therapy Session (1), Independent Review of image, tracing or specimen (2) and Review of Medication Regimen & Side Effects (2)  I certify that inpatient services furnished can reasonably be expected to improve the patient's condition.   Earleen Newport, FNP-BC 8/21/20161:29 PM

## 2015-03-12 NOTE — [facility] Group Notes (Signed)
[facility] Group Notes:  (Clinical Social Work)  03/12/2015  1:15-2:15PM  Summary of Progress/Problems:   The main focus of today's process group was to   1)  discuss the importance of adding supports  2)  define health supports versus unhealthy supports  3)  identify the patient's current unhealthy supports and plan how to handle them  4)  Identify the patient's current healthy supports and plan what to add.  An emphasis was placed on using counselor, doctor, therapy groups, 12-step groups, and problem-specific support groups to expand supports.    The patient expressed full comprehension of the concepts presented, and agreed that there is a need to add more supports.  The patient stated her siblings, spouse, 1 of her children, and some church members are her main supports.  She believes that having a therapist to talk to could help her deal with the many stressors in life now.  Type of Therapy:  Process Group with Motivational Interviewing  Participation Level:  Active  Participation Quality:  Attentive, Sharing and Supportive  Affect:  Appropriate  Cognitive:  Alert, Appropriate and Oriented  Insight:  Engaged  Engagement in Therapy:  Engaged  Modes of Intervention:   Education, Support and Processing, Activity  Selmer Dominion, LCSW 03/12/2015

## 2015-03-12 NOTE — Progress Notes (Signed)
D: Patient in the dayroom interacting with peers on approach.  Patient states she had a much better day today.  Patient states she is happy today and is ready to go home.  Patient states she has been spending time talking to her peers.  Patient states her goal was to get out of here today.   Patient denies SI/HI and denies AVH.   A: Staff to monitor Q 15 mins for safety.  Encouragement and support offered.  Scheduled medications administered per orders.  Ativan administered prn for anxiety R: Patient remains safe on the unit.  Patient attended group tonight.  Patient visible on the unit and interacting with peers.  Patient taking administered medications.

## 2015-03-12 NOTE — Progress Notes (Signed)
Patient has been up and visible in the milieu today. Affect appropriate, mood pleasant with slight anxiety. Patient states she is discharging tomorrow which has alleviated her stress greatly. Rating depression at a 4/10, hopelessness at a 1/10 and anxiety at a 2/10. Pleased that her adderall was continued. States her goal is to "get released and get back home for a productive week at work." Referred to her OD as a "lapse in judgement." Patient medicated per orders. Offered support, encouragement. Reports mild pain of a 3-4/10 but refuses prn tylenol. No other physical complaints. Denies SI/HI and remains safe. Jamie Kato

## 2015-03-12 NOTE — Plan of Care (Signed)
Problem: Diagnosis: Increased Risk For Suicide Attempt Goal: STG-Patient Will Report Suicidal Feelings to Staff Outcome: Progressing Patient denying SI Goal: STG-Patient Will Comply With Medication Regime Outcome: Progressing Patient has been med compliant.

## 2015-03-13 DIAGNOSIS — F4323 Adjustment disorder with mixed anxiety and depressed mood: Principal | ICD-10-CM

## 2015-03-13 MED ORDER — ACAMPROSATE CALCIUM 333 MG PO TBEC: 666.0000 mg | DELAYED_RELEASE_TABLET | Freq: Three times a day (TID) | ORAL | Status: DC

## 2015-03-13 MED ORDER — PROGESTERONE MICRONIZED 100 MG PO CAPS: 100.0000 mg | ORAL_CAPSULE | Freq: Every day | ORAL | Status: DC

## 2015-03-13 MED ORDER — ACAMPROSATE CALCIUM 333 MG PO TBEC
666.0000 mg | DELAYED_RELEASE_TABLET | Freq: Three times a day (TID) | ORAL | Status: DC
  Administered 2015-03-13: 666 mg via ORAL
  Filled 2015-03-13 (×2): qty 2
  Filled 2015-03-13: qty 18
  Filled 2015-03-13 (×3): qty 2
  Filled 2015-03-13: qty 18
  Filled 2015-03-13: qty 2
  Filled 2015-03-13: qty 18
  Filled 2015-03-13: qty 2

## 2015-03-13 MED ORDER — TRAZODONE HCL 50 MG PO TABS: 50.0000 mg | ORAL_TABLET | Freq: Every evening | ORAL | Status: DC | PRN

## 2015-03-13 MED ORDER — LORAZEPAM 0.5 MG PO TABS: 0.5000 mg | ORAL_TABLET | Freq: Two times a day (BID) | ORAL | Status: DC | PRN

## 2015-03-13 MED ORDER — AMPHETAMINE-DEXTROAMPHET ER 20 MG PO CP24: 20.0000 mg | ORAL_CAPSULE | Freq: Two times a day (BID) | ORAL | Status: DC

## 2015-03-13 MED ORDER — NICOTINE 21 MG/24HR TD PT24: 21.0000 mg | MEDICATED_PATCH | Freq: Every day | TRANSDERMAL | Status: DC

## 2015-03-13 NOTE — BHH Group Notes (Signed)
   [facility] LCSW Aftercare Discharge Planning Group Note  03/13/2015  8:45 AM   Participation Quality: Alert, Appropriate and Oriented  Mood/Affect: Appropriate  Depression Rating: 0  Anxiety Rating: 0  Thoughts of Suicide: Pt denies SI/HI  Will you contract for safety? Yes  Current AVH: Pt denies  Plan for Discharge/Comments: Pt attended discharge planning group and actively participated in group. CSW provided pt with today's workbook. Patient reports feeling "great" today. She plans to return home to follow up with outpatient services.   Transportation Means: Pt reports access to transportation  Supports: No supports mentioned at this time  Tilden Fossa, MSW, [place] Social Worker [employer] [phone]

## 2015-03-13 NOTE — Progress Notes (Addendum)
D: Patient's self inventory sheet, patient sleeps good, no sleep medication given.  Good appetite, normal energy level, good concentration.  Denied depression, hopeless and anxiety.  Denied withdrawals.  Denied SI.  Denied physical problems.  Denied pain.  Goal is discharge.  Does have discharge plans.  No problems anticipated after discharge.   Patient denied SI and HI, contracts for safety.  Denied A/V hallucinations.  Denied pain.   A:  Medications administered per MD orders.  Emotional support and encouragement given patient. R:  Safety maintained with 15 minute checks.

## 2015-03-13 NOTE — Plan of Care (Signed)
Problem: Consults Goal: Anxiety Disorder Patient Education See Patient Education Module for eduction specifics.  Outcome: Completed/Met Date Met:  03/13/15 Nurse discussed anxiety/coping skills with patient.     

## 2015-03-13 NOTE — Discharge Summary (Signed)
Physician Discharge Summary Note  Patient:  Leah Jones is an 54 y.o., female MRN:  [id number] DOB:  05-12-1961 Patient phone:  [phone] (home)  Patient address:   [place] [address],  Total Time spent with patient: Greater than 30 minutes  Date of Admission:  03/11/2015  Date of Discharge: 03-13-15  Reason for Admission:  Greater than 30 minutes  Principal Problem: Adjustment disorder with mixed anxiety and depressed mood  Discharge Diagnoses: Patient Active Problem List   Diagnosis Date Noted  . Adjustment disorder with mixed anxiety and depressed mood [F43.23] 03/12/2015  . Suicidal ideation [R45.851]   . Preventative health care [Z00.00] 05/20/2014  . Discoloration of skin of foot [L81.9] 05/20/2014  . Panic attack as reaction to stress [F41.0] 05/20/2014  . Eustachian tube dysfunction [H69.80] 01/25/2014  . Allergic rhinitis [J30.9] 01/25/2014  . ADD (attention deficit disorder) [F90.9] 01/17/2012  . CARPAL TUNNEL SYNDROME [G56.00] 12/01/2009  . B12 DEFICIENCY [E53.8] 04/15/2007  . ANAL FISSURE, HX OF [Z87.2] 04/15/2007   Musculoskeletal: Strength & Muscle Tone: within normal limits Gait & Station: normal Patient leans: N/A  Psychiatric Specialty Exam: Physical Exam  Neck: Normal range of motion.  Psychiatric: Her speech is normal and behavior is normal. Judgment and thought content normal. Her mood appears not anxious. Her affect is not angry, not blunt and not labile. Cognition and memory are normal. She does not exhibit a depressed mood.    Review of Systems  Constitutional: Negative.   Eyes: Negative.   Respiratory: Negative.   Cardiovascular: Negative.   Gastrointestinal: Negative.   Genitourinary: Negative.   Musculoskeletal: Negative.   Skin: Negative.   Neurological: Negative.   Endo/Heme/Allergies: Negative.   Psychiatric/Behavioral: Positive for depression (Stable). Negative for suicidal ideas, hallucinations, memory loss and  substance abuse. The patient has insomnia (Stable). The patient is not nervous/anxious.     Blood pressure 115/89, pulse 106, temperature 98.2 F (36.8 C), temperature source Oral, resp. rate 16, height 5\' 6"  (1.676 m), weight 55.792 kg (123 lb).Body mass index is 19.86 kg/(m^2).  See Md's SRA   Have you used any form of tobacco in the last 30 days? (Cigarettes, Smokeless Tobacco, Cigars, and/or Pipes): Yes  Has this patient used any form of tobacco in the last 30 days? (Cigarettes, Smokeless Tobacco, Cigars, and/or Pipes) Yes, A prescription for an FDA-approved tobacco cessation medication was offered at discharge and the patient refused  Past Medical History:  Past Medical History  Diagnosis Date  . ADD (attention deficit disorder)   . Carpal tunnel syndrome   . B12 deficiency     Past Surgical History  Procedure Laterality Date  . Tonsillectomy    . Rhinoplasty    . Foot surgery Left     Morton's neuroma, Dr Paulla Dolly   Family History:  Family History  Problem Relation Age of Onset  . Diabetes    . Hypertension    . Colon cancer Mother   . Breast cancer Mother   . Celiac disease Mother   . Pancreatic cancer Paternal Grandfather    Social History:  History  Alcohol Use  . Yes    Comment: socially     History  Drug Use No    Social History   Social History  . Marital Status: Married    Spouse Name: N/A  . Number of Children: 3  . Years of Education: N/A   Occupational History  . [employer]     Social History Main Topics  .  Smoking status: Current Every Day Smoker -- 1.00 packs/day    Types: Cigarettes  . Smokeless tobacco: Never Used  . Alcohol Use: Yes     Comment: socially  . Drug Use: No  . Sexual Activity: Not Asked   Other Topics Concern  . 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, 3 miscarriages   Risk to Self: Is patient at risk for suicide?: No What has been your use of drugs/alcohol within the last 12 months?: Alcohol use 6 days a week, about  2 beers and 3/4 bottle of wine Risk to Others: No Prior Inpatient Therapy: Yes Prior Outpatient Therapy: Yes  Level of Care:  OP  Hospital Course:  Patient presented to [facility] after an overdose. Patient states that stressors are her 38 yr old son who gets into a lot of trouble and changes in her medication related to possible menopausal changes. At this time patient denies suicidal ideation; but states that she did take overdose because she had enough and wanted to end everything. Patient states that she feelings of hopelessness and worthlessness. Patient denies any hallucination or any paranoia. Patient has outpatient services with Dr. Marquis Buggy office. Patient has been dealing with the stressors of her son for a long time and states that she has never done anything like this. Patient denies homicidal ideation, psychosis, and paranoia.  Leah Jones was admitted to the hospital with a blood alcohol level of 112 per toxicology tests reports and her UDS test reports was (+) for Amphetamine. She was also complaining of feelings of hopelessness & worthlessness due to worsening symptoms of depression. This led to her impulsively overdosed incident, although, she stated that she was not suicidal. Blue required no alcohol detoxification treatments as she was not presenting with any substance withdrawal symptoms.   During her hospital stay, Leah Jones was  medicated & discharged on; CAMPRAL 666 mg for alcoholism, Adderall XR 20 mg for ADHD, Lorazepam 0.5 mg prn for anxiety & Trazodone 50 mg Q bedtime for insomnia. She was resumed on all her pertinent home medications for her other pre-existing medical issues that she presented. She tolerated her treatment regimen without any significant adverse effects and or reactions reported. Leah Jones was also enrolled & participated in the group counseling sessions being offered and held on this unit. She learned coping skills that should help him cope better after  discharge.  Leah Jones was motivated for recovery during brief hospital stay. She worked closely with the treatment team and case managers to develop a discharge plan with appropriate goals to maintain mood stability. Coping skills, problem solving as well as relaxation therapies were also part of her unit programming. Her mood is now stabilized. This is evidenced by her reports of improved mood, absence of suicidal ideations. Upon discharge she was in much improved condition than upon admission. Her symptoms were reported as significantly decreased or resolved completely. She currently denies any SIHI, AVH, delusional thoughts and or paranoia. She was motivated to continue taking medications with a goal of continued improvement in mental health.  Leah Jones will follow-up care as noted below.  She iss provided with all the pertinent information needed to make this appointment without problems. Transportation per her arrangement.   Consults:  psychiatry  Significant Diagnostic Studies:  labs: CBC with diff, CMP, UDS, toxicology tests, U/A, results reviewed, stable  Discharge Vitals:   Blood pressure 115/89, pulse 106, temperature 98.2 F (36.8 C), temperature source Oral, resp. rate 16, height 5\' 6"  (1.676 m), weight  55.792 kg (123 lb). Body mass index is 19.86 kg/(m^2). Lab Results:   Results for orders placed or performed during the hospital encounter of 03/11/15 (from the past 72 hour(s))  TSH     Status: None   Collection Time: 03/12/15  7:28 PM  Result Value Ref Range   TSH 1.908 0.350 - 4.500 uIU/mL    Comment: Performed at [facility]    Physical Findings: AIMS: Facial and Oral Movements Muscles of Facial Expression: None, normal Lips and Perioral Area: None, normal Jaw: None, normal Tongue: None, normal,Extremity Movements Upper (arms, wrists, hands, fingers): None, normal Lower (legs, knees, ankles, toes): None, normal, Trunk Movements Neck, shoulders, hips: None,  normal, Overall Severity Severity of abnormal movements (highest score from questions above): None, normal Incapacitation due to abnormal movements: None, normal Patient's awareness of abnormal movements (rate only patient's report): No Awareness, Dental Status Current problems with teeth and/or dentures?: No Does patient usually wear dentures?: No  CIWA:  CIWA-Ar Total: 1 COWS:  COWS Total Score: 2  See Psychiatric Specialty Exam and Suicide Risk Assessment completed by Attending Physician prior to discharge.  Discharge destination:  Home  Is patient on multiple antipsychotic therapies at discharge:  No   Has Patient had three or more failed trials of antipsychotic monotherapy by history:  No  Recommended Plan for Multiple Antipsychotic Therapies: NA    Medication List    STOP taking these medications        cetirizine 10 MG tablet  Commonly known as:  ZYRTEC     dexmethylphenidate 10 MG tablet  Commonly known as:  FOCALIN     ESTRING 2 MG vaginal ring  Generic drug:  estradiol     fish oil-omega-3 fatty acids 1000 MG capsule     fluticasone 50 MCG/ACT nasal spray  Commonly known as:  FLONASE     multivitamin capsule     omeprazole 40 MG capsule  Commonly known as:  PRILOSEC     PROBIOTIC FORMULA PO     Vitamins B1 B6 B12 Liqd      TAKE these medications      Indication   acamprosate 333 MG tablet  Commonly known as:  CAMPRAL  Take 2 tablets (666 mg total) by mouth 3 (three) times daily with meals. For alcoholism   Indication:  Excessive Use of Alcohol     amphetamine-dextroamphetamine 20 MG 24 hr capsule  Commonly known as:  ADDERALL XR  Take 1 capsule (20 mg total) by mouth 2 (two) times daily. For ADHD   Indication:  Attention Deficit Hyperactivity Disorder     LORazepam 0.5 MG tablet  Commonly known as:  ATIVAN  Take 1 tablet (0.5 mg total) by mouth 2 (two) times daily as needed for anxiety or sleep.   Indication:  Feeling Anxious, Insomnia      nicotine 21 mg/24hr patch  Commonly known as:  NICODERM CQ - dosed in mg/24 hours  Place 1 patch (21 mg total) onto the skin daily. For nicotine   Indication:  Nicotine Addiction     progesterone 100 MG capsule  Commonly known as:  PROMETRIUM  Take 1 capsule (100 mg total) by mouth at bedtime. Hormonal replacement   Indication:  Hormonal replacement     traZODone 50 MG tablet  Commonly known as:  DESYREL  Take 1 tablet (50 mg total) by mouth at bedtime as needed for sleep.   Indication:  Trouble Sleeping  Follow-up Information    Follow up with [facility]. Schedule an appointment as soon as possible for a visit on 03/15/2015.   Why:  Medication management appointment on Wednesday August 24th at 3pm. Please call office if you need to reschedule.   Contact information:   [address]  [place]  [address] Telephone:  [phone]     Follow-up recommendations: Activity:  As tolerated Diet: As recommended by your primary care doctor. Keep all scheduled follow-up appointments as recommended.   Comments: Take all your medications as prescribed by your mental healthcare provider. Report any adverse effects and or reactions from your medicines to your outpatient provider promptly. Patient is instructed and cautioned to not engage in alcohol and or illegal drug use while on prescription medicines. In the event of worsening symptoms, patient is instructed to call the crisis hotline, 911 and or go to the nearest ED for appropriate evaluation and treatment of symptoms. Follow-up with your primary care provider for your other medical issues, concerns and or health care needs.   Total Discharge Time: Greater than 30 minutes  Signed: Encarnacion Slates, PMHNP,FNP-BC 03/13/2015, 4:47 PM  Patient seen, Suicide Assessment Completed.  Disposition Plan Reviewed

## 2015-03-13 NOTE — Tx Team (Signed)
Interdisciplinary Treatment Plan Update (Adult) Date: 03/13/2015   Time Reviewed: 9:30 AM  Progress in Treatment: Attending groups: Yes Participating in groups: Yes Taking medication as prescribed: Yes Tolerating medication: Yes Family/Significant other contact made: No, CSW assessing for appropriate contacts Patient understands diagnosis: Yes Discussing patient identified problems/goals with staff: Yes Medical problems stabilized or resolved: Yes Denies suicidal/homicidal ideation: Yes Issues/concerns per patient self-inventory: Yes Other:  New problem(s) identified: N/A  Discharge Plan or Barriers:   03/13/2015:   Reason for Continuation of Hospitalization:  Depression Anxiety Medication Stabilization   Comments: N/A  Estimated length of stay: Discharge anticipated for today 8/22.   Leah Jones is a 54yo female who was hospitalized pursuant to a suicide attempt of alcohol plus 6-7 Ativan, being treated at [facility] for Attention Deficit Disorder, would like therapy there also. Stressors include ongoing MH/SA issues with [facility] and conflict with husband, particularly over her drinking. Is close to siblings, states if husband makes her choose, she will choose sibs over him. The patient would benefit from safety monitoring, medication evaluation, psychoeducation, group therapy, and discharge planning to link with ongoing resources.    Review of initial/current patient goals per problem list:  1. Goal(s): Patient will participate in aftercare plan   Met: Yes   Target date: 3-5 days post admission date   As evidenced by: Patient will participate within aftercare plan AEB aftercare provider and housing plan at discharge being identified.  03/13/2015: Goal met: Patient plans to return home with outpatient services.   2. Goal (s): Patient will exhibit decreased depressive symptoms and suicidal ideations.   Met: Yes   Target date: 3-5 days post  admission date   As evidenced by: Patient will utilize self rating of depression at 3 or below and demonstrate decreased signs of depression or be deemed stable for discharge by MD.  03/13/2015: Goal met: Patient rates depression at 0 and denies SI.    Attendees: Patient:    Family:    Physician: Dr. Parke Poisson; Dr. Sabra Heck 03/13/2015 9:30 AM  Nursing: Arleta Creek RN 03/13/2015 9:30 AM  Clinical Social Worker: Erasmo Downer Kalene Cutler,  [place] 03/13/2015 9:30 AM  Other: Peri Maris, LCSWA 03/13/2015 9:30 AM  Other: Lucinda Dell, [place] Liaison 03/13/2015 9:30 AM  Other:  03/13/2015 9:30 AM  Other: Ave Filter, NP 03/13/2015 9:30 AM  Other:    Other:    Other:    Other:    Other:     Scribe for Treatment Team:  Tilden Fossa, MSW, [employer] [phone]

## 2015-03-13 NOTE — Progress Notes (Signed)
Recreation Therapy Notes  Date: 08.22.2016 Time: 08.22.2016 Location: [address] Dayroom   Group Topic: Stress Management  Goal Area(s) Addresses:  Patient will actively participate in stress management techniques presented during session.   Behavioral Response: Appropriate, Engaged, Attentive  Intervention: Stress management techniques  Activity :  Deep Breathing and Guided Imagery. LRT provided instruction and demonstration on practice of Guided Imagery. Technique was coupled with deep breathing.   Education:  Stress Management, Discharge Planning.   Education Outcome: Acknowledges edcuation  Clinical Observations/Feedback: Patient actively engaged in techniques presented, expressing no concerns and expressing ability to practice independently post d/c.    Laureen Ochs Rosabell Geyer, LRT/CTRS  [place] L 03/13/2015 1:56 PM

## 2015-03-13 NOTE — BHH Suicide Risk Assessment (Addendum)
[facility] Discharge Suicide Risk Assessment   Demographic Factors:  54 year old female, married, employed.  Total Time spent with patient: 30 minutes  Musculoskeletal: Strength & Muscle Tone: within normal limits Gait & Station: normal Patient leans: N/A  Psychiatric Specialty Exam: Physical Exam  ROS  Blood pressure 125/86, pulse 89, temperature 98.2 F (36.8 C), temperature source Oral, resp. rate 16, height 5\' 6"  (1.676 m), weight 123 lb (55.792 kg).Body mass index is 19.86 kg/(m^2).  General Appearance: Well Groomed  Eye Contact::  Good  Speech:  Normal Rate409  Volume:  Normal  Mood:  improved, today denies depression  Affect:  Appropriate and Full Range  Thought Process:  Linear  Orientation:  Full (Time, Place, and Person)  Thought Content:  no hallucinations, no delusions , not internally preoccupied   Suicidal Thoughts:  No- denies suicidal ideations , denies any self injurious ideations   Homicidal Thoughts:  No  Memory:  recent and remote grossly intact   Judgement:  Good  Insight:  Good  Psychomotor Activity:  Normal  Concentration:  Good  Recall:  Good  Fund of Knowledge:Good  Language: Negative  Akathisia:  Negative  Handed:  Right  AIMS (if indicated):     Assets:  Communication Skills Desire for Improvement Resilience Social Support Vocational/Educational  Sleep:  Number of Hours: 6.25  Cognition: WNL  ADL's:  Intact   Have you used any form of tobacco in the last 30 days? (Cigarettes, Smokeless Tobacco, Cigars, and/or Pipes): Yes  Has this patient used any form of tobacco in the last 30 days? (Cigarettes, Smokeless Tobacco, Cigars, and/or Pipes) Yes, A prescription for an FDA-approved tobacco cessation medication was offered at discharge and the patient refused  Mental Status Per Nursing Assessment::   On Admission:  NA  Current Mental Status by Physician: At this time patient is fully alert, attentive, well related, pleasant, mood is " fine ",  denies depression, affect is reactive, smiles often and appropriately, no thought disorder, no suicidal or self injurious ideations, no hallucinations, no delusions , future oriented, focused on returning to work soon  Loss Factors:  difficulty managing her 10 year old son, who has behavioral problems , drug abuse issues .  Historical Factors:  denies any prior history of suicide gestures / SI.  Risk Reduction Factors:   Responsible for children under 90 years of age, Sense of responsibility to family, Employed, Living with another person, especially a relative and Positive coping skills or problem solving skills  Continued Clinical Symptoms:  As noted, patient is doing well at this time.  We discussed issues at length- she acknowledges a gradually worsening pattern of alcohol  Abuse. Has been drinking more often over recent months, sometimes up to a bottle of wine per day. Drinks alone sometimes, often with goal  to " relax", answers 3/4 CAGE questions affirmatively. ( desire to cut down, annoyed when family tells her she is drinking too much, feeling guilty at times due to drinking). She states recent event Dorita Sciara was very impulsive, not planned out, and out of character for her. Attributes it to alcohol consumption prior . Of note, admission BAL 112. Patient interested in medication to decrease desire to drink. We discussed naltrexone and campral- agrees to campral.  Of note, with patient 's consent and in her presence, I spoke with her husband, who has visited her daily. He states she seems much better and supports her being discharged today.   Cognitive Features That Contribute To Risk:  No gross cognitive deficits noted upon discharge. Is alert , attentive, and oriented x 3   Suicide Risk:  Mild:  Suicidal ideation of limited frequency, intensity, duration, and specificity.  There are no identifiable plans, no associated intent, mild dysphoria and related symptoms, good self-control  (both objective and subjective assessment), few other risk factors, and identifiable protective factors, including available and accessible social support.  Principal Problem: Adjustment disorder with mixed anxiety and depressed mood Discharge Diagnoses:  Patient Active Problem List   Diagnosis Date Noted  . Adjustment disorder with mixed anxiety and depressed mood [F43.23] 03/12/2015  . Suicidal ideation [R45.851]   . Preventative health care [Z00.00] 05/20/2014  . Discoloration of skin of foot [L81.9] 05/20/2014  . Panic attack as reaction to stress [F41.0] 05/20/2014  . Eustachian tube dysfunction [H69.80] 01/25/2014  . Allergic rhinitis [J30.9] 01/25/2014  . ADD (attention deficit disorder) [F90.9] 01/17/2012  . CARPAL TUNNEL SYNDROME [G56.00] 12/01/2009  . B12 DEFICIENCY [E53.8] 04/15/2007  . ANAL FISSURE, HX OF [Z87.2] 04/15/2007    Follow-up Information    Schedule an appointment as soon as possible for a visit with [facility].   Why:  For hospital discharge follow-up with Nurse Practitioner, as well as for therapy appointment   Contact information:   [address]  [place]  [address] Telephone:  [phone]      Plan Of Care/Follow-up recommendations:  Activity:  as tolerated  Diet:  Regular Tests:  NA Other:  see below  Is patient on multiple antipsychotic therapies at discharge:  No   Has Patient had three or more failed trials of antipsychotic monotherapy by history:  No  Recommended Plan for Multiple Antipsychotic Therapies: NA There are no current grounds for ongoing involuntary commitment and patient is wanting discharge.  Patient is leaving in good spirits. Plans to return home and to work soon. Plans to follow up as above. Encouraged  Her to abstain from alcohol completely   Elleen Coulibaly, [place] 03/13/2015, 1:11 PM

## 2015-03-13 NOTE — Clinical Social Work Note (Signed)
Patient provided with a list of therapists per her request.  Tilden Fossa, MSW, [place] Worker [facility] [phone]

## 2015-03-13 NOTE — [facility] Suicide Risk Assessment (Signed)
[place] INPATIENT:  Family/Significant Other Suicide Prevention Education  Suicide Prevention Education:  Education Completed; husband [phone],  (name of family member/significant other) has been identified by the patient as the family member/significant other with whom the patient will be residing, and identified as the person(s) who will aid the patient in the event of a mental health crisis (suicidal ideations/suicide attempt).  With written consent from the patient, the family member/significant other has been provided the following suicide prevention education, prior to the and/or following the discharge of the patient.  The suicide prevention education provided includes the following:  Suicide risk factors  Suicide prevention and interventions  National Suicide Hotline telephone number  [facility] assessment telephone number  [facility] Emergency Assistance [place] and/or Residential Mobile Crisis Unit telephone number  Request made of family/significant other to:  Remove weapons (e.g., guns, rifles, knives), all items previously/currently identified as safety concern.    Remove drugs/medications (over-the-counter, prescriptions, illicit drugs), all items previously/currently identified as a safety concern.  The family member/significant other verbalizes understanding of the suicide prevention education information provided.  The family member/significant other agrees to remove the items of safety concern listed above.  Maxie Debose, Casimiro Needle 03/13/2015, 3:17 PM

## 2015-03-13 NOTE — Progress Notes (Signed)
  [facility] Adult Case Management Discharge Plan :  Will you be returning to the same living situation after discharge:  Yes,  patient will return home at discharge At discharge, do you have transportation home?: Yes,  patient reports access to transportation Do you have the ability to pay for your medications: Yes,  patient will be provided with prescriptions at discharge  Release of information consent forms completed and in the chart;  Patient's signature needed at discharge.  Patient to Follow up at: Follow-up Information    Schedule an appointment as soon as possible for a visit with [place].   Why:  For hospital discharge follow-up with Nurse Practitioner, as well as for therapy appointment   Contact information:   [place].  [place]  [address] Telephone:  [phone]      Patient denies SI/HI: Yes,  denies    Safety Planning and Suicide Prevention discussed: Yes,  with patient and husband  Have you used any form of tobacco in the last 30 days? (Cigarettes, Smokeless Tobacco, Cigars, and/or Pipes): Yes  Has patient been referred to the Quitline?: N/A patient is not a smoker  Narvel Kozub, Erasmo Downer L 03/13/2015, 1:33 PM

## 2015-03-13 NOTE — Progress Notes (Signed)
Discharge Note:  Patient discharge home with family member.  Denied SI and HI, contracts for safety.  Denied A/V hallucinations.  Patient stated she received all her belongings, clothing, toiletries, misc items, prescriptions, medications, iphone, bags, cases, all belongings.  Suicide prevention information given and discussed with patient who stated she understood and had no questions.  Patient stated she appreciated all assistance with [facility] staff.

## 2015-05-22 ENCOUNTER — Encounter: Payer: Self-pay | Admitting: Podiatry

## 2015-05-22 ENCOUNTER — Ambulatory Visit (INDEPENDENT_AMBULATORY_CARE_PROVIDER_SITE_OTHER): Payer: Managed Care, Other (non HMO) | Admitting: Podiatry

## 2015-05-22 ENCOUNTER — Ambulatory Visit (INDEPENDENT_AMBULATORY_CARE_PROVIDER_SITE_OTHER): Payer: Managed Care, Other (non HMO)

## 2015-05-22 VITALS — BP 133/96 | HR 116 | Resp 16

## 2015-05-22 DIAGNOSIS — M79672 Pain in left foot: Secondary | ICD-10-CM | POA: Diagnosis not present

## 2015-05-22 DIAGNOSIS — M779 Enthesopathy, unspecified: Secondary | ICD-10-CM

## 2015-05-22 DIAGNOSIS — M2040 Other hammer toe(s) (acquired), unspecified foot: Secondary | ICD-10-CM

## 2015-05-22 MED ORDER — TRIAMCINOLONE ACETONIDE 10 MG/ML IJ SUSP
10.0000 mg | Freq: Once | INTRAMUSCULAR | Status: AC
  Administered 2015-05-22: 10 mg

## 2015-05-22 NOTE — Progress Notes (Signed)
   Subjective:    Patient ID: Leah Jones, female    DOB: Sep 26, 1960, 54 y.o.   MRN: [id number]  HPI Patient presents with foot pain in their left foot, interdigital 2nd & 3rd toe & 3rd & 4th toe; bottom of foot & top of foot. Pt stated, "Went to [facility] from Jan. to Oct. 2015". [facility] was not able to help patient. Pt stated, "last shot doctor gave, burned foot". Pt went to this doctor to get a second opinion of foot.  Pt is a former patient. Had 2 neuroma's removed in July 2014.   Review of Systems  All other systems reviewed and are negative.      Objective:   Physical Exam        Assessment & Plan:

## 2015-05-24 NOTE — Progress Notes (Signed)
Subjective:     Patient ID: Leah Jones Roles, female   DOB: [birth year], 54 y.o.   MRN: [id number]  HPI patient presents stating I went to another doctor and he did these injections for nerve issues but are not helping me and I'm having a lot of pain and inflammation in my joint on my left foot   Review of Systems     Objective:   Physical Exam  neurovascular status intact muscle strength adequate with inflammation of the third MPJ left with fluid buildup and moderate prominence of the underlying metatarsal with digital contracture noted of the lesser digits    Assessment:      I do believe this is more inflammatory than it is nerve related and at this time I did discussed and want to treat in this way    Plan:      reviewed x-rays and did a proximal nerve block left and then discussed risk of injection of the joint which she understands. I did a careful injection getting out a small amount of clear fluid and injected with a quarter cc deck Smith some Kenalog and applied thick plantar pad to remove pressure on the joint. May ultimately require digital stabilization and metatarsal osteotomies of symptoms persist

## 2015-06-05 ENCOUNTER — Ambulatory Visit: Payer: Managed Care, Other (non HMO) | Admitting: Podiatry

## 2015-06-07 ENCOUNTER — Ambulatory Visit (INDEPENDENT_AMBULATORY_CARE_PROVIDER_SITE_OTHER): Payer: Managed Care, Other (non HMO) | Admitting: Podiatry

## 2015-06-07 ENCOUNTER — Encounter: Payer: Self-pay | Admitting: Podiatry

## 2015-06-07 VITALS — BP 136/107 | HR 103 | Resp 16

## 2015-06-07 DIAGNOSIS — M216X9 Other acquired deformities of unspecified foot: Secondary | ICD-10-CM | POA: Diagnosis not present

## 2015-06-07 DIAGNOSIS — M779 Enthesopathy, unspecified: Secondary | ICD-10-CM | POA: Diagnosis not present

## 2015-06-07 NOTE — Progress Notes (Signed)
Subjective:     Patient ID: Leah Jones Roles, female   DOB: 08-Dec-1960, 54 y.o.   MRN: [id number]  HPI patient states it does feel quite a bit better than it was previously but it still present   Review of Systems     Objective:   Physical Exam Neurovascular status intact with plantarflexed metatarsal third left with inflammatory changes in the joint surface with fluid buildup noted    Assessment:     Protruding third metatarsal left with inflammatory changes to the joint surface and mild to moderate digital deformity present with inflammatory capsulitis    Plan:     Reviewed both conditions and I've recommended orthotics to try to reduce stress against the area. Patient is scanned for customized orthotic devices

## 2015-06-19 ENCOUNTER — Ambulatory Visit (INDEPENDENT_AMBULATORY_CARE_PROVIDER_SITE_OTHER): Payer: Managed Care, Other (non HMO) | Admitting: Family Medicine

## 2015-06-19 ENCOUNTER — Encounter: Payer: Self-pay | Admitting: Family Medicine

## 2015-06-19 VITALS — BP 132/88 | HR 92 | Temp 98.6°F | Resp 18 | Ht 66.0 in | Wt 125.0 lb

## 2015-06-19 DIAGNOSIS — M20012 Mallet finger of left finger(s): Secondary | ICD-10-CM

## 2015-06-19 DIAGNOSIS — S6992XA Unspecified injury of left wrist, hand and finger(s), initial encounter: Secondary | ICD-10-CM | POA: Diagnosis not present

## 2015-06-19 HISTORY — DX: Mallet finger of left finger(s): M20.012

## 2015-06-19 NOTE — Patient Instructions (Signed)
Mallet Finger  Mallet finger is an injury that occurs from a blow to the tip of your straightened finger or thumb. It is also known as baseball finger. The blow to your fingertip causes it to bend farther than normal, which tears the cord that attaches to the tip of your finger (extensor tendon). Your extensor tendon is what straightens the end of your finger. If this tendon is damaged, you will not be able to straighten your fingertip. Sometimes, a piece of bone may be pulled away with the tendon (avulsion injury), or the tendon may tear completely. In some cases, surgery may be required to repair the damage.  CAUSES  Mallet finger is caused by a hard, direct hit to the tip of your finger or thumb. This injury often happens from getting hit in the finger with a hard ball, such as a baseball.  RISK FACTORS  This injury is more likely to happen if you play sports that use a hard ball.  SYMPTOMS   The main symptom of this injury is not being able to straighten the tip of your finger. You can manually straighten your fingertip with your other hand, but the finger cannot straighten on its own. Other symptoms may include:  · Pain.  · Swelling.  · Bruising.  · Blood under the fingernail.  DIAGNOSIS   Your health care provider may suspect mallet finger if you are not able to extend your fingertip, especially if you recently injured your hand. Your health care provider will do a physical exam. This may include X-rays to see if a piece of bone has been pulled away or if the finger joint has separated (dislocated).  TREATMENT   Mallet finger may be treated with:  · Wearing a splint on your fingertip to keep it straight (extended) while the tendon heals.  · Surgery to repair the tendon, in severe cases. This may involve:    The use of a pin or screw to keep your finger extended and your tendon attached.    Taking a piece of tendon from another part of your body (graft) to replace a torn tendon.  HOME CARE INSTRUCTIONS   · Take  medicines only as directed by your health care provider.  · Wear the splint as directed by your health care provider. Remove it only as directed by your health care provider.  · If you take your splint off to dry it or change it, gently press your finger on a flat surface to keep it straight.  · If directed, apply ice to the injured area:      Put ice in a plastic bag.      Place a towel between your skin and the bag.      Leave the ice on for 20 minutes, 2-3 times a day.  · Raise the injured area above the level of your heart while you are sitting or lying down.  SEEK MEDICAL CARE IF:   · You have pain or swelling that is getting worse.    · Your finger feels cold.    · You cannot extend your finger after treatment.  SEEK IMMEDIATE MEDICAL CARE IF:   · Even after loosening your splint, your finger is:    Very red and swollen.    White or blue.    Numb or tingling.     This information is not intended to replace advice given to you by your health care provider. Make sure you discuss any questions you have with your health 

## 2015-06-19 NOTE — Progress Notes (Signed)
   Subjective:    Patient ID: Leah Jones, female    DOB: [birth year], 54 y.o.   MRN: [id number]  HPI   Finger injury: Patient presents for an acute office visit today with complaints of left fifth finger discomfort for 2 days. She states that she was holding her small niece on her hip, when she bent forward to pick something up and lost her balance. She states that her left fifth finger hit against the floor. She states she immediately put ice on the finger and attempted to straighten it, but was unable to straighten the area. She states that the end of her finger is tilted downwards and she cannot straighten it. She states yesterday he started to bruise. She has been taking ibuprofen for discomfort. She states that it intermittently tingle on the end of her finger, but she still has sensation.   Past Medical History  Diagnosis Date  . ADD (attention deficit disorder)   . Carpal tunnel syndrome   . B12 deficiency    No Known Allergies   Review of Systems Negative, with the exception of above mentioned in HPI     Objective:   Physical Exam BP 132/88 mmHg  Pulse 92  Temp(Src) 98.6 F (37 C) (Temporal)  Resp 18  Ht 5\' 6"  (1.676 m)  Wt 125 lb (56.7 kg)  BMI 20.19 kg/m2  SpO2 100% Gen: Afebrile. No acute distress. Nontoxic in appearance, well-developed, well-nourished, Caucasian female. HENT: AT. [place].MMM.  Eyes:Pupils Equal Round Reactive to light, Extraocular movements intact,  Conjunctiva without redness, discharge or icterus. MSK: Mild erythema, mild soft tissue swelling, bruising present left fifth PIP joint. Mallet deformity present. Unable to extend PIP. Mild tenderness to palpation. Neurovascular intact distally. Good capillary refill. Skin: No rashes, purpura or petechiae. Skin intact.    Assessment & Plan:   Injury of fifth finger, left, initial encounter/ mallet finger - Patient will need further evaluation with x-rays, possible extensor tendon injury of the left fifth  finger versus fracture. Discussed with patient getting her work and to an acute orthopedic appointment today. Patient was agreeable and appointment was made with [facility] today. Discussed holding on x-rays, as they will obtain x-rays in the office and treat accordingly. Patient agreeable with plan.

## 2015-06-19 NOTE — Progress Notes (Signed)
Pre visit review using our clinic review tool, if applicable. No additional management support is needed unless otherwise documented below in the visit note. 

## 2015-06-28 ENCOUNTER — Other Ambulatory Visit: Payer: Managed Care, Other (non HMO)

## 2015-07-04 ENCOUNTER — Other Ambulatory Visit: Payer: Managed Care, Other (non HMO)

## 2015-07-05 ENCOUNTER — Telehealth: Payer: Self-pay | Admitting: *Deleted

## 2015-07-05 NOTE — Telephone Encounter (Signed)
Pt states she needs more metatarsal pads to get her to her orthotic appt with [facility].  Left message that she could get the pads today before 500pm or tomorrow 800am.

## 2015-07-07 ENCOUNTER — Ambulatory Visit: Payer: Managed Care, Other (non HMO) | Admitting: *Deleted

## 2015-07-07 ENCOUNTER — Other Ambulatory Visit: Payer: Managed Care, Other (non HMO)

## 2015-07-07 DIAGNOSIS — M779 Enthesopathy, unspecified: Secondary | ICD-10-CM

## 2015-07-07 NOTE — Progress Notes (Signed)
Patient ID: Leah Jones, female   DOB: 05-12-1961, 54 y.o.   MRN: [id number] Patient presents for orthotic pick up.  Verbal and written break in and wear instructions given.  Patient will follow up in 4 weeks if symptoms worsen or fail to improve.

## 2015-07-07 NOTE — Patient Instructions (Signed)

## 2015-07-20 ENCOUNTER — Other Ambulatory Visit: Payer: Self-pay | Admitting: Internal Medicine

## 2015-07-21 ENCOUNTER — Other Ambulatory Visit: Payer: Self-pay | Admitting: Family Medicine

## 2015-10-20 ENCOUNTER — Telehealth: Payer: Self-pay

## 2015-10-20 NOTE — Telephone Encounter (Signed)
Spoke to patient. Offered to schedule flu shot. Patient will call back to schedule.

## 2015-10-31 ENCOUNTER — Telehealth: Payer: Self-pay | Admitting: Family Medicine

## 2015-10-31 NOTE — Telephone Encounter (Signed)
Patient hasn't had a tetanus shot in 17 years & she didn't get a flu shot this year. She would like to know if it is too late to get a flu shot & she would also like to know if she can get the 2 shots together. Please call

## 2015-10-31 NOTE — Telephone Encounter (Signed)
Spoke with patient she will call back to schedule appt for Tdap . Per Dr Raoul Pitch too late in season for influenza vaccine.

## 2016-03-30 ENCOUNTER — Encounter (HOSPITAL_COMMUNITY): Payer: Self-pay | Admitting: *Deleted

## 2016-03-30 ENCOUNTER — Ambulatory Visit (HOSPITAL_COMMUNITY)
Admission: EM | Admit: 2016-03-30 | Discharge: 2016-03-30 | Disposition: A | Discharge status: Home / Self Care | Payer: Managed Care, Other (non HMO) | Attending: Internal Medicine | Admitting: Internal Medicine

## 2016-03-30 DIAGNOSIS — S40861A Insect bite (nonvenomous) of right upper arm, initial encounter: Secondary | ICD-10-CM | POA: Diagnosis not present

## 2016-03-30 DIAGNOSIS — W57XXXA Bitten or stung by nonvenomous insect and other nonvenomous arthropods, initial encounter: Secondary | ICD-10-CM

## 2016-03-30 DIAGNOSIS — Z23 Encounter for immunization: Secondary | ICD-10-CM

## 2016-03-30 HISTORY — DX: Alcohol dependence, uncomplicated: F10.20

## 2016-03-30 MED ORDER — METHYLPREDNISOLONE ACETATE 80 MG/ML IJ SUSP
INTRAMUSCULAR | Status: AC
  Filled 2016-03-30: qty 1

## 2016-03-30 MED ORDER — TETANUS-DIPHTHERIA TOXOIDS TD 5-2 LFU IM INJ
0.5000 mL | INJECTION | Freq: Once | INTRAMUSCULAR | Status: AC
  Administered 2016-03-30: 0.5 mL via INTRAMUSCULAR

## 2016-03-30 MED ORDER — METHYLPREDNISOLONE ACETATE 80 MG/ML IJ SUSP
80.0000 mg | Freq: Once | INTRAMUSCULAR | Status: AC
  Administered 2016-03-30: 80 mg via INTRAMUSCULAR

## 2016-03-30 MED ORDER — TETANUS-DIPHTH-ACELL PERTUSSIS 5-2.5-18.5 LF-MCG/0.5 IM SUSP
INTRAMUSCULAR | Status: AC
  Filled 2016-03-30: qty 0.5

## 2016-03-30 NOTE — ED Triage Notes (Signed)
Reports wasp sting to right upper arm 2 days ago.  Yesterday upper arm and posterior elbow with redness.  Today redness has become lighter, but is now extending into lower arm.  C/O some discomfort and pruritis.

## 2016-03-30 NOTE — Discharge Instructions (Signed)
Go to ED or call 911 if any symptoms of shortness of breath or swelling of face and lips develop.  If any signs of infection such as fever or streaks, please return for additional treatment.

## 2016-03-30 NOTE — ED Provider Notes (Signed)
CSN: [id number]     Arrival date & time 03/30/16  1923 History   First MD Initiated Contact with Patient 03/30/16 2040     Chief Complaint  Patient presents with  . Insect Bite   (Consider location/radiation/quality/duration/timing/severity/associated sxs/prior Treatment)  HPI   Patient is a 55 year old female presenting today with complaints of a wasp bite to her right upper arm which occurred yesterday afternoon and she was watering her garden. Patient denies difficulty breathing, shortness of breath, angioedema or other signs of acute allergic reaction. She states that the wasp sting site is extremely painful and has been very red and "swollen" since yesterday. Denies significant medical history, uncertain of last date of tetanus.  Past Medical History:  Diagnosis Date  . ADD (attention deficit disorder)   . Alcoholism ([facility])    per record  . B12 deficiency   . Carpal tunnel syndrome    Past Surgical History:  Procedure Laterality Date  . FOOT SURGERY Left    Morton's neuroma, Dr Paulla Dolly  . RHINOPLASTY    . TONSILLECTOMY     Family History  Problem Relation Age of Onset  . Diabetes    . Hypertension    . Colon cancer Mother   . Breast cancer Mother   . Celiac disease Mother   . Pancreatic cancer Paternal Grandfather    Social History  Substance Use Topics  . Smoking status: Current Some Day Smoker    Types: Cigarettes  . Smokeless tobacco: Never Used  . Alcohol use Yes   OB History    Gravida Para Term Preterm AB Living   5 3           SAB TAB Ectopic Multiple Live Births                 Review of Systems  Constitutional: Negative.  Negative for fatigue and fever.  HENT: Negative.  Negative for facial swelling and trouble swallowing.   Eyes: Negative.  Negative for visual disturbance.  Respiratory: Negative.  Negative for cough, shortness of breath and wheezing.   Cardiovascular: Negative.  Negative for chest pain and leg swelling.  Gastrointestinal: Negative.    Endocrine: Negative.   Genitourinary: Negative.   Musculoskeletal: Negative.  Negative for gait problem and neck stiffness.  Skin: Positive for wound.       Redness and swelling at site of wasp bite yesterday.   Allergic/Immunologic: Negative.   Neurological: Negative.  Negative for dizziness and headaches.  Hematological: Negative.   Psychiatric/Behavioral: Negative.     Allergies  Review of patient's allergies indicates no known allergies.  Home Medications   Prior to Admission medications   Medication Sig Start Date End Date Taking? Authorizing Provider  atomoxetine (STRATTERA) 40 MG capsule Take 40 mg by mouth daily.   Yes Historical Provider, MD  ESTRING 2 MG vaginal ring  05/28/15  Yes Historical Provider, MD  progesterone (PROMETRIUM) 100 MG capsule Take 1 capsule (100 mg total) by mouth at bedtime. Hormonal replacement 03/13/15  Yes Encarnacion Slates, NP  acamprosate (CAMPRAL) 333 MG tablet Take 2 tablets (666 mg total) by mouth 3 (three) times daily with meals. For alcoholism 03/13/15   Encarnacion Slates, NP  amphetamine-dextroamphetamine (ADDERALL XR) 20 MG 24 hr capsule Take 1 capsule (20 mg total) by mouth 2 (two) times daily. For ADHD 03/13/15   Encarnacion Slates, NP  fluticasone (FLONASE) 50 MCG/ACT nasal spray USE 1 SPRAY INTO BOTH NOSTRILS 2 TIMES A DAY AS NEEDED  FOR ALLERGIES OR RHINITIS. 07/21/15   Renee A Kuneff, DO  LORazepam (ATIVAN) 0.5 MG tablet Take 1 tablet (0.5 mg total) by mouth 2 (two) times daily as needed for anxiety or sleep. Patient not taking: Reported on 06/19/2015 03/13/15   Encarnacion Slates, NP  nicotine (NICODERM CQ - DOSED IN MG/24 HOURS) 21 mg/24hr patch Place 1 patch (21 mg total) onto the skin daily. For nicotine Patient not taking: Reported on 06/19/2015 03/13/15   Encarnacion Slates, NP  traZODone (DESYREL) 50 MG tablet Take 1 tablet (50 mg total) by mouth at bedtime as needed for sleep. Patient not taking: Reported on 06/19/2015 03/13/15   Encarnacion Slates, NP   Meds  Ordered and Administered this Visit   Medications  tetanus & diphtheria toxoids (adult) [facility]) injection 0.5 mL (0.5 mLs Intramuscular Given 03/30/16 2107)  methylPREDNISolone acetate (DEPO-MEDROL) injection 80 mg (80 mg Intramuscular Given 03/30/16 2110)    BP 135/85   Pulse 86   Temp 98 F (36.7 C) (Oral)   Resp 16   SpO2 100%  No data found.   Physical Exam  Constitutional: She appears well-developed and well-nourished. No distress.  Neck: Normal range of motion. Neck supple.  Cardiovascular: Normal rate, regular rhythm, normal heart sounds and intact distal pulses.  Exam reveals no gallop and no friction rub.   No murmur heard. Pulmonary/Chest: Effort normal and breath sounds normal. No respiratory distress. She has no wheezes. She has no rales. She exhibits no tenderness.  Lymphadenopathy:    She has no cervical adenopathy.  Skin: Skin is warm and dry. Capillary refill takes less than 2 seconds. She is not diaphoretic.     Erythemic area and warmth consistent with histamine response. No evidence of streaks or purulent drainage.  Patient has an erythematous area extending from her elbow to her right upper arm.  Nursing note and vitals reviewed.   Urgent Care Course   Clinical Course    Procedures (including critical care time)  Labs Review Labs Reviewed - No data to display  Imaging Review No results found.   MDM   1. Insect bite of right arm, initial encounter    Meds ordered this encounter  Medications  . atomoxetine (STRATTERA) 40 MG capsule    Sig: Take 40 mg by mouth daily.  Marland Kitchen tetanus & diphtheria toxoids (adult) (TENIVAC) injection 0.5 mL  . methylPREDNISolone acetate (DEPO-MEDROL) injection 80 mg    The usual and customary discharge instructions and warnings were given.  The patient verbalizes understanding and agrees to plan of care.      Nehemiah Settle, NP 03/30/16 2127

## 2016-04-26 ENCOUNTER — Encounter: Payer: Self-pay | Admitting: Podiatry

## 2016-04-26 ENCOUNTER — Ambulatory Visit (INDEPENDENT_AMBULATORY_CARE_PROVIDER_SITE_OTHER): Payer: Managed Care, Other (non HMO)

## 2016-04-26 ENCOUNTER — Ambulatory Visit (INDEPENDENT_AMBULATORY_CARE_PROVIDER_SITE_OTHER): Payer: Managed Care, Other (non HMO) | Admitting: Podiatry

## 2016-04-26 DIAGNOSIS — D3610 Benign neoplasm of peripheral nerves and autonomic nervous system, unspecified: Secondary | ICD-10-CM

## 2016-04-26 DIAGNOSIS — G5762 Lesion of plantar nerve, left lower limb: Secondary | ICD-10-CM | POA: Diagnosis not present

## 2016-04-26 DIAGNOSIS — M79672 Pain in left foot: Secondary | ICD-10-CM | POA: Diagnosis not present

## 2016-04-26 DIAGNOSIS — G629 Polyneuropathy, unspecified: Secondary | ICD-10-CM

## 2016-04-26 MED ORDER — GABAPENTIN 300 MG PO CAPS: 300.0000 mg | ORAL_CAPSULE | Freq: Three times a day (TID) | ORAL | 3 refills | Status: DC

## 2016-04-28 NOTE — Progress Notes (Signed)
Subjective:     Patient ID: Leah Jones, female   DOB: 09-04-1960, 55 y.o.   MRN: [id number]  HPI patient presents stating that the left foot is gradually starting to tingle more and it does seem to be coming more of an issue for her   Review of Systems     Objective:   Physical Exam Neurovascular status intact with discomfort second interspace left with shooting pains between the second and third toes and a distal direction with the third interspace doing well currently    Assessment:     Possibility for neuroma symptomatology versus neuropathy with family history of neuropathy    Plan:     Explain both conditions and did do a careful injection left of the second interspace with a purified alcohol Marcaine injection to create sclerosing the fact and then also placed on low-dose gabapentin with one pill to be taken at night and if tolerated well will increased dosage 22 or 3 pills per day. Reappoint to recheck in approximately 3 weeks

## 2016-05-09 ENCOUNTER — Ambulatory Visit (INDEPENDENT_AMBULATORY_CARE_PROVIDER_SITE_OTHER): Payer: Managed Care, Other (non HMO) | Admitting: Podiatry

## 2016-05-09 DIAGNOSIS — D3610 Benign neoplasm of peripheral nerves and autonomic nervous system, unspecified: Secondary | ICD-10-CM

## 2016-05-09 DIAGNOSIS — G5762 Lesion of plantar nerve, left lower limb: Secondary | ICD-10-CM | POA: Diagnosis not present

## 2016-05-09 DIAGNOSIS — G629 Polyneuropathy, unspecified: Secondary | ICD-10-CM

## 2016-05-09 NOTE — Progress Notes (Signed)
Subjective:     Patient ID: Leah Jones Roles, female   DOB: Jun 11, 1961, 55 y.o.   MRN: [id number]  HPI patient presents stating she still getting a lot of shooting between the second and third toes left and so far she's been taking 1 pill at night with no problems   Review of Systems     Objective:   Physical Exam Neurovascular status intact muscle strength adequate with discomfort second interspace left with continued radiating-like discomfort and also tingling of the digits themselves    Assessment:     What appears to be reoccurrence of neuroma symptomatology second interspace left with also possible neuropathy with family history of condition    Plan:     Reviewed conditions and at this time we will start to increase her gabapentin and also I did reinject with a purified alcohol Marcaine solution second interspace left and discussed possible exploration of this interspace if symptoms remain persistent. She will increase her gabapentin to 2 and then 3 today we seen back again in approximate 4 weeks

## 2016-05-30 ENCOUNTER — Ambulatory Visit: Payer: Managed Care, Other (non HMO) | Admitting: Podiatry

## 2016-06-06 ENCOUNTER — Ambulatory Visit: Payer: Managed Care, Other (non HMO) | Admitting: Podiatry

## 2016-06-12 ENCOUNTER — Ambulatory Visit: Payer: Managed Care, Other (non HMO) | Admitting: Podiatry

## 2018-01-04 ENCOUNTER — Encounter (HOSPITAL_BASED_OUTPATIENT_CLINIC_OR_DEPARTMENT_OTHER): Payer: Self-pay | Admitting: Emergency Medicine

## 2018-01-04 ENCOUNTER — Other Ambulatory Visit: Payer: Self-pay

## 2018-01-04 ENCOUNTER — Emergency Department (HOSPITAL_BASED_OUTPATIENT_CLINIC_OR_DEPARTMENT_OTHER)
Admission: EM | Admit: 2018-01-04 | Discharge: 2018-01-04 | Disposition: A | Discharge status: Home / Self Care | Payer: 59 | Attending: Emergency Medicine | Admitting: Emergency Medicine

## 2018-01-04 DIAGNOSIS — S0502XA Injury of conjunctiva and corneal abrasion without foreign body, left eye, initial encounter: Secondary | ICD-10-CM | POA: Insufficient documentation

## 2018-01-04 DIAGNOSIS — F17210 Nicotine dependence, cigarettes, uncomplicated: Secondary | ICD-10-CM | POA: Insufficient documentation

## 2018-01-04 DIAGNOSIS — H5789 Other specified disorders of eye and adnexa: Secondary | ICD-10-CM | POA: Diagnosis not present

## 2018-01-04 DIAGNOSIS — Y998 Other external cause status: Secondary | ICD-10-CM | POA: Insufficient documentation

## 2018-01-04 DIAGNOSIS — Y9389 Activity, other specified: Secondary | ICD-10-CM | POA: Diagnosis not present

## 2018-01-04 DIAGNOSIS — X58XXXA Exposure to other specified factors, initial encounter: Secondary | ICD-10-CM | POA: Diagnosis not present

## 2018-01-04 DIAGNOSIS — Z79899 Other long term (current) drug therapy: Secondary | ICD-10-CM | POA: Diagnosis not present

## 2018-01-04 DIAGNOSIS — Y929 Unspecified place or not applicable: Secondary | ICD-10-CM | POA: Insufficient documentation

## 2018-01-04 DIAGNOSIS — S0592XA Unspecified injury of left eye and orbit, initial encounter: Secondary | ICD-10-CM | POA: Diagnosis present

## 2018-01-04 MED ORDER — FLUTICASONE PROPIONATE 50 MCG/ACT NA SUSP: 1.0000 | Freq: Every day | NASAL | 0 refills | Status: DC

## 2018-01-04 MED ORDER — TETRACAINE HCL 0.5 % OP SOLN
2.0000 [drp] | Freq: Once | OPHTHALMIC | Status: AC
  Administered 2018-01-04: 2 [drp] via OPHTHALMIC
  Filled 2018-01-04: qty 4

## 2018-01-04 MED ORDER — FLUORESCEIN SODIUM 1 MG OP STRP
1.0000 | ORAL_STRIP | Freq: Once | OPHTHALMIC | Status: AC
  Administered 2018-01-04: 1 via OPHTHALMIC
  Filled 2018-01-04: qty 1

## 2018-01-04 MED ORDER — ERYTHROMYCIN 5 MG/GM OP OINT: TOPICAL_OINTMENT | OPHTHALMIC | 0 refills | Status: DC

## 2018-01-04 NOTE — ED Provider Notes (Signed)
[facility] EMERGENCY DEPARTMENT Provider Note   CSN: [id number] Arrival date & time: 01/04/18  1551     History   Chief Complaint Chief Complaint  Patient presents with  . Eye Problem    HPI Leah Jones is a 57 y.o. female with a hx of tobacco abuse, EtOH abuse, and ADD who presents to the ED with complaints of L eye pain and FB sensation that started this afternoon. Patient states she has had a runny nose and watery discharge from bilateral eyes for the past few days, eyes have been somewhat itchy. She has been wiping at the eyes and taking pseudophed for this. She states this afternoon she started to feel like there was something in her L eye, she states it has been painful, and has continued to water. She reports her vision is not necessarily blurry, but she is having some trouble seeing which she attributes to watery drainage. She states that the pain is an 8/10 in severity, worse with light, no alleviating factors. She tried flushing the eyes at home with water. She wears glasses at baseline but does not have them with her, she is not a contact lens wearer. Denies fever, chills, redness around the eye, or swelling around the eye. Last tetanus was 1 year ago.   HPI  Past Medical History:  Diagnosis Date  . ADD (attention deficit disorder)   . Alcoholism ([place])    per record  . B12 deficiency   . Carpal tunnel syndrome     Patient Active Problem List   Diagnosis Date Noted  . Injury of fifth finger, left 06/19/2015  . Mallet deformity of fifth finger, left, acquired 06/19/2015  . Adjustment disorder with mixed anxiety and depressed mood 03/12/2015  . Suicidal ideation   . Preventative health care 05/20/2014  . Discoloration of skin of foot 05/20/2014  . Panic attack as reaction to stress 05/20/2014  . Eustachian tube dysfunction 01/25/2014  . Allergic rhinitis 01/25/2014  . ADD (attention deficit disorder) 01/17/2012  . CARPAL TUNNEL SYNDROME 12/01/2009  . B12  DEFICIENCY 04/15/2007  . ANAL FISSURE, HX OF 04/15/2007    Past Surgical History:  Procedure Laterality Date  . FOOT SURGERY Left    Morton's neuroma, Dr Paulla Dolly  . RHINOPLASTY    . TONSILLECTOMY       OB History    Gravida  5   Para  3   Term      Preterm      AB      Living        SAB      TAB      Ectopic      Multiple      Live Births               Home Medications    Prior to Admission medications   Medication Sig Start Date End Date Taking? Authorizing Provider  busPIRone (BUSPAR) 15 MG tablet Take 15 mg by mouth 3 (three) times daily.   Yes [provider]  hydrOXYzine (ATARAX/VISTARIL) 25 MG tablet Take 25 mg by mouth 3 (three) times daily as needed.   Yes [provider]  OXcarbazepine (TRILEPTAL) 150 MG tablet Take 450 mg by mouth at bedtime.   Yes [provider]  valACYclovir (VALTREX) 500 MG tablet Take 500 mg by mouth 2 (two) times daily.   Yes [provider]  acamprosate (CAMPRAL) 333 MG tablet Take 2 tablets (666 mg total) by mouth  3 (three) times daily with meals. For alcoholism 03/13/15   Lindell Spar I, NP  amphetamine-dextroamphetamine (ADDERALL XR) 20 MG 24 hr capsule Take 1 capsule (20 mg total) by mouth 2 (two) times daily. For ADHD 03/13/15   Lindell Spar I, NP  atomoxetine (STRATTERA) 40 MG capsule Take 40 mg by mouth daily.    [provider]  fluticasone (FLONASE) 50 MCG/ACT nasal spray USE 1 SPRAY INTO BOTH NOSTRILS 2 TIMES A DAY AS NEEDED FOR ALLERGIES OR RHINITIS. 07/21/15   Kuneff, Renee A, DO  gabapentin (NEURONTIN) 300 MG capsule Take 1 capsule (300 mg total) by mouth 3 (three) times daily. 04/26/16   Wallene Huh, DPM    Family History Family History  Problem Relation Age of Onset  . Diabetes Unknown   . Hypertension Unknown   . Colon cancer Mother   . Breast cancer Mother   . Celiac disease Mother   . Pancreatic cancer Paternal Grandfather     Social History Social  History   Tobacco Use  . Smoking status: Current Some Day Smoker    Types: Cigarettes  . Smokeless tobacco: Never Used  Substance Use Topics  . Alcohol use: Yes  . Drug use: No     Allergies   Patient has no known allergies.   Review of Systems Review of Systems  Constitutional: Negative for chills and fever.  HENT: Positive for congestion and rhinorrhea. Negative for ear pain and sore throat.   Eyes: Positive for photophobia, pain, discharge and itching.  Respiratory: Negative for cough and shortness of breath.   Cardiovascular: Negative for chest pain.     Physical Exam Updated Vital Signs BP (!) 176/91 (BP Location: Right Arm)   Pulse 75   Temp 98 F (36.7 C) (Oral)   Resp 18   Ht 5\' 6"  (1.676 m)   Wt 56.7 kg (125 lb)   SpO2 100%   BMI 20.18 kg/m   Physical Exam  Constitutional: She appears well-developed and well-nourished.  Non-toxic appearance. No distress.  HENT:  Head: Normocephalic and atraumatic.  Right Ear: Tympanic membrane is not perforated, not erythematous, not retracted and not bulging.  Left Ear: Tympanic membrane is not perforated, not erythematous, not retracted and not bulging.  Nose: Mucosal edema present. Right sinus exhibits no maxillary sinus tenderness and no frontal sinus tenderness. Left sinus exhibits no maxillary sinus tenderness and no frontal sinus tenderness.  Mouth/Throat: Uvula is midline and oropharynx is clear and moist. No oropharyngeal exudate or posterior oropharyngeal erythema.  Eyes: Pupils are equal, round, and reactive to light. Conjunctivae and EOM are normal. Lids are everted and swept, no foreign bodies found.  No proptosis.  No periorbital edema, erythema or tenderness to palpation.  Patient does have some watery drainage from the left eye.   Visual acuity: Bilateral Distance: 20/100  L Distance: 20/100 R distance: 20/50-visual acuity performed without patient's glasses.  Woods lamp exam: There is an area of floor  seen uptake at the 6 o'clock position of the left cornea.  There is approximately 4 mm in size and appears consistent with a corneal abrasion.  Does not appear consistent with a corneal ulcer.  No dendritic staining.  No hyphema or hypopyon.  No appreciable foreign bodies. IOP left eye: 17  Neck: Normal range of motion. Neck supple.  Cardiovascular: Normal rate and regular rhythm.  No murmur heard. Pulmonary/Chest: Breath sounds normal. No respiratory distress. She has no wheezes. She has no rales.  Abdominal: Soft.  She exhibits no distension. There is no tenderness.  Lymphadenopathy:    She has no cervical adenopathy.  Neurological: She is alert.  Skin: Skin is warm and dry. No rash noted.  Psychiatric: She has a normal mood and affect. Her behavior is normal.  Nursing note and vitals reviewed.    ED Treatments / Results  Labs (all labs ordered are listed, but only abnormal results are displayed) Labs Reviewed - No data to display  EKG None  Radiology No results found.  Procedures Procedures (including critical care time)  Medications Ordered in ED Medications  tetracaine (PONTOCAINE) 0.5 % ophthalmic solution 2 drop (2 drops Left Eye Given 01/04/18 1649)  fluorescein ophthalmic strip 1 strip (1 strip Left Eye Given 01/04/18 1649)    Initial Impression / Assessment and Plan / ED Course  I have reviewed the triage vital signs and the nursing notes.  Pertinent labs & imaging results that were available during my care of the patient were reviewed by me and considered in my medical decision making (see chart for details).    Patient presents watery drainage from eyes bilaterally and runny nose for past few days, developed left eye discomfort and foreign body sensation today.  Patient nontoxic-appearing, no apparent distress, vitals WNL with the exception of elevated blood pressure, doubt HTN emergency, patient aware of need for recheck.  Patient's exam consistent with corneal  abrasion to the left eye, suspect component of allergies as well given other sxs.  No evidence of foreign body.  Visual acuity is decreased in the left eye somewhat, however this is difficult to assess given patient does not have her corrective lenses with her. Patient is not a contact lens wearer, she utilizes glasses. No indication of corneal ulceration or HSV. Patient is afebrile and without proptosis, entrapment, or consensual photophobia, no periorbital swelling- doubt periorbital or orbital cellulitis. IOP WNL, doubt acute glaucoma. Her tetanus is up to date. Will give prescription for flonase and for erythromycin ointment with ophthalmology follow up. I discussed results, treatment plan, need for PCP follow-up, and return precautions with the patient. Provided opportunity for questions, patient confirmed understanding and is in agreement with plan.    Final Clinical Impressions(s) / ED Diagnoses   Final diagnoses:  Abrasion of left cornea, initial encounter    ED Discharge Orders        Ordered    fluticasone (FLONASE) 50 MCG/ACT nasal spray  Daily     01/04/18 1805    erythromycin ophthalmic ointment     01/04/18 [address], Lolo R, PA-C 01/04/18 1944    Sherwood Gambler, MD 01/05/18 0004

## 2018-01-04 NOTE — ED Triage Notes (Signed)
Patient has had pain to her left eye since this am - the patient reports that they have been watering over the last 2 -3 days

## 2018-01-04 NOTE — Discharge Instructions (Signed)
You were seen in the emergency department due to watering from your eyes and feeling that you had a foreign object in the left eye.  We suspect the watering of your eyes as well as your runny nose to be related to possible allergies.  We are giving a prescription for Flonase, this is an intranasal steroid to help with this.  You may also take over-the-counter Claritin or Allegra or Zyrtec per over-the-counter dosing.  You have a corneal abrasion to your left eye-these typically heal on their own.  We gave you antibiotics to place in the eye 4 times per day (erythromycin) to prevent infection.  We would like you to follow-up with ophthalmology, an eye specialist, for reevaluation of your eyes in 3 days.  Return to the ER at anytime for new or worsening symptoms including but not limited to worsening pain, change in your vision, swelling/redness around the eye, fever, or any other concerns.  Additionally follow-up with your primary care provider within 1 week for recheck of your blood pressure as this was elevated in the emergency department today.

## 2018-04-11 ENCOUNTER — Encounter: Payer: Self-pay | Admitting: *Deleted

## 2018-04-28 ENCOUNTER — Other Ambulatory Visit: Payer: Self-pay

## 2018-04-28 MED ORDER — BUSPIRONE HCL 15 MG PO TABS: 15.0000 mg | ORAL_TABLET | Freq: Three times a day (TID) | ORAL | 0 refills | Status: DC

## 2018-05-04 ENCOUNTER — Ambulatory Visit: Payer: 59 | Admitting: Psychiatry

## 2018-05-04 ENCOUNTER — Encounter: Payer: Self-pay | Admitting: Psychiatry

## 2018-05-04 VITALS — BP 134/84 | HR 107 | Ht 66.0 in

## 2018-05-04 DIAGNOSIS — F5101 Primary insomnia: Secondary | ICD-10-CM

## 2018-05-04 DIAGNOSIS — F411 Generalized anxiety disorder: Secondary | ICD-10-CM

## 2018-05-04 DIAGNOSIS — F902 Attention-deficit hyperactivity disorder, combined type: Secondary | ICD-10-CM | POA: Diagnosis not present

## 2018-05-04 DIAGNOSIS — F39 Unspecified mood [affective] disorder: Secondary | ICD-10-CM

## 2018-05-04 MED ORDER — AMPHETAMINE-DEXTROAMPHETAMINE 10 MG PO TABS: ORAL_TABLET | ORAL | 0 refills | Status: DC

## 2018-05-04 MED ORDER — AMPHETAMINE-DEXTROAMPHETAMINE 10 MG PO TABS: 10.0000 mg | ORAL_TABLET | Freq: Every day | ORAL | 0 refills | Status: DC

## 2018-05-04 MED ORDER — BUSPIRONE HCL 15 MG PO TABS: 15.0000 mg | ORAL_TABLET | Freq: Three times a day (TID) | ORAL | 2 refills | Status: DC

## 2018-05-04 MED ORDER — HYDROXYZINE HCL 25 MG PO TABS: 25.0000 mg | ORAL_TABLET | Freq: Three times a day (TID) | ORAL | 2 refills | Status: DC | PRN

## 2018-05-04 MED ORDER — PROPRANOLOL HCL 10 MG PO TABS: ORAL_TABLET | ORAL | 2 refills | Status: DC
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
## 2018-05-04 NOTE — Addendum Note (Signed)
Addended by: Sharyl Nimrod on: 05/04/2018 03:56 PM   Modules accepted: Orders

## 2018-06-24 ENCOUNTER — Telehealth: Payer: Self-pay

## 2018-06-24 NOTE — Telephone Encounter (Signed)
Copied from [place] [id number]. Topic: General - Inquiry >> Jun 24, 2018 12:34 PM Rutherford Nail, NT wrote: Reason for CRM: patient calling and states taht it is time for her 2nd shingrix vaccine. States that she received her 1st injection at the [facility] and now that it is time for her 2nd injection, they are out. Would like to know if she could get that 2nd injection at the office? Please advise.

## 2018-06-24 NOTE — Telephone Encounter (Signed)
Detailed message left on voice mail informing patient that we could give her second shingrix. She will need to bring copy of 1st shingrix so we can document it and make sure we are not to early in giving the injection. Patient instructed to call back to schedule nurse visit for injection.

## 2018-07-01 ENCOUNTER — Ambulatory Visit: Payer: 59

## 2018-07-22 HISTORY — PX: COLPOSCOPY: SHX161

## 2018-08-07 ENCOUNTER — Ambulatory Visit (INDEPENDENT_AMBULATORY_CARE_PROVIDER_SITE_OTHER): Payer: 59 | Admitting: Psychiatry

## 2018-08-07 ENCOUNTER — Encounter: Payer: Self-pay | Admitting: Psychiatry

## 2018-08-07 VITALS — BP 135/93 | HR 73

## 2018-08-07 DIAGNOSIS — F411 Generalized anxiety disorder: Secondary | ICD-10-CM | POA: Diagnosis not present

## 2018-08-07 DIAGNOSIS — F39 Unspecified mood [affective] disorder: Secondary | ICD-10-CM

## 2018-08-07 DIAGNOSIS — F5101 Primary insomnia: Secondary | ICD-10-CM | POA: Diagnosis not present

## 2018-08-07 DIAGNOSIS — F902 Attention-deficit hyperactivity disorder, combined type: Secondary | ICD-10-CM

## 2018-08-07 MED ORDER — AMPHETAMINE-DEXTROAMPHETAMINE 10 MG PO TABS: ORAL_TABLET | ORAL | 0 refills | Status: DC

## 2018-08-07 MED ORDER — PROPRANOLOL HCL 10 MG PO TABS: ORAL_TABLET | ORAL | 2 refills | Status: DC

## 2018-08-07 MED ORDER — BUSPIRONE HCL 15 MG PO TABS: 15.0000 mg | ORAL_TABLET | Freq: Two times a day (BID) | ORAL | 1 refills | Status: DC

## 2018-08-07 MED ORDER — HYDROXYZINE HCL 25 MG PO TABS
25.0000 mg | ORAL_TABLET | Freq: Three times a day (TID) | ORAL | 2 refills | Status: DC | PRN
Start: 2018-08-07 — End: 2018-09-15

## 2018-08-07 NOTE — Progress Notes (Signed)
Leah Jones [id number] 02-19-1961 58 y.o.  Subjective:   Patient ID:  Leah Jones is a 58 y.o. (DOB 1961/03/25) female.  Chief Complaint:  Chief Complaint  Patient presents with  . Anxiety  . ADD  . Other    h/o depression    HPI Kayra Crowell presents to the office today for follow-up of depression, anxiety, and ADD. She reports that her husband of 28 years left on 07/10/18. She reports "some days I stay busy and don't really think about it, and other day I am crying and anxious." She reports that Buspar has not been effective for her anxiety and has been taking it prn. She reports having episodes of increased anxiety. Reports acute anxiety presents as uncontrolled crying or feeling suddenly overwhelmed and needing to leave. Denies full blown panic attacks. She reports hydroxyzine helps with sleep initiation. Reports that she then awakens at 4 am and is awake for about an hour and then returns to sleep until 7 am. Reports that when she awakens her "mind is going 100 mph." She reports that her moods are "affected by everybody else." She reports that her appetite has been poor and reports that she has lost weight. Reports energy and motivation have been ok. She reports that concentration is improved with Adderall. Denies SI. Denies impulsive or risky behavior. Denies unexplained anger.   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   Past Psychiatric Medication Trials: Trileptal Buspar Ritalin Strattera Vyvanse Adderall Adderall XR Hydroxyzine Propranolol Clonidine Prozac- Adverse effects Lithium- Adverse effects Gabapentin   Review of Systems:  Review of Systems  HENT: Positive for sneezing.   Musculoskeletal: Negative for gait problem.  Neurological: Negative for tremors.  Psychiatric/Behavioral:       Please refer to HPI    Medications: I have reviewed the patient's current medications.  Current Outpatient Medications  Medication Sig Dispense Refill  .  Acetylcysteine (NAC) 500 MG CAPS Take by mouth.    Derrill Memo ON 10/02/2018] amphetamine-dextroamphetamine (ADDERALL) 10 MG tablet Take 1/2-1 po BID 60 tablet 0  . [START ON 09/04/2018] amphetamine-dextroamphetamine (ADDERALL) 10 MG tablet Take 1/2-1 po BID 60 tablet 0  . amphetamine-dextroamphetamine (ADDERALL) 10 MG tablet Take 1/2-1 tab po BID 60 tablet 0  . busPIRone (BUSPAR) 15 MG tablet Take 1 tablet (15 mg total) by mouth 2 (two) times daily. 180 tablet 1  . Cyanocobalamin (VITAMIN B 12 PO) Take by mouth.    . ESTRADIOL PO Take by mouth.    . hydrOXYzine (ATARAX/VISTARIL) 25 MG tablet Take 1 tablet (25 mg total) by mouth 3 (three) times daily as needed. 90 tablet 2  . medroxyPROGESTERone (PROVERA) 2.5 MG tablet Take 2.5 mg by mouth daily.    . propranolol (INDERAL) 10 MG tablet Take 1-2 tabs po BID prn anxiety 120 tablet 2  . valACYclovir (VALTREX) 500 MG tablet Take 500 mg by mouth 2 (two) times daily.    Marland Kitchen erythromycin ophthalmic ointment Place a 1/2 inch ribbon of ointment into the lower eyelid of the left eye 4 times daily for the next 5 days. (Patient not taking: Reported on 05/04/2018) 1 g 0  . fluticasone (FLONASE) 50 MCG/ACT nasal spray Place 1 spray into both nostrils daily. (Patient not taking: Reported on 05/04/2018) 16 g 0  . gabapentin (NEURONTIN) 300 MG capsule Take 1 capsule (300 mg total) by mouth 3 (three) times daily. (Patient not taking: Reported on 05/04/2018) 90 capsule 3   No current facility-administered medications for this visit.  Medication Side Effects: None  Allergies: No Known Allergies  Past Medical History:  Diagnosis Date  . ADD (attention deficit disorder)   . Alcoholism ([place])    per record  . B12 deficiency   . Carpal tunnel syndrome     Family History  Problem Relation Age of Onset  . Diabetes Unknown   . Hypertension Unknown   . Colon cancer Mother   . Breast cancer Mother   . Celiac disease Mother   . Pancreatic cancer Paternal  Grandfather   . Anxiety disorder Sister   . ADD / ADHD Sister   . Bipolar disorder Brother   . Bipolar disorder Son     Social History   Socioeconomic History  . Marital status: Married    Spouse name: Not on file  . Number of children: 3  . Years of education: Not on file  . Highest education level: Not on file  Occupational History  . Occupation: Buyer, retail: [employer]  Social Needs  . Financial resource strain: Not on file  . Food insecurity:    Worry: Not on file    Inability: Not on file  . Transportation needs:    Medical: Not on file    Non-medical: Not on file  Tobacco Use  . Smoking status: Current Some Day Smoker    Types: Cigarettes  . Smokeless tobacco: Never Used  Substance and Sexual Activity  . Alcohol use: Yes    Comment: Drinking 3-4 days a week. Drinks a bottle of wine or 3-4 beers.  . Drug use: No  . Sexual activity: Not on file  Lifestyle  . Physical activity:    Days per week: Not on file    Minutes per session: Not on file  . Stress: Not on file  Relationships  . Social connections:    Talks on phone: Not on file    Gets together: Not on file    Attends religious service: Not on file    Active member of club or organization: Not on file    Attends meetings of clubs or organizations: Not on file    Relationship status: Not on file  . Intimate partner violence:    Fear of current or ex partner: Not on file    Emotionally abused: Not on file    Physically abused: Not on file    Forced sexual activity: Not on file  Other Topics Concern  . 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   Please see review of systems for further details on the patient's review from today.   Objective:   Physical Exam:  BP (!) 135/93   Pulse 73   Physical Exam Constitutional:      General:  She is not in acute distress.    Appearance: She is well-developed.  Musculoskeletal:        General: No deformity.  Neurological:     Mental Status: She is alert and oriented to person, place, and time.     Coordination: Coordination normal.  Psychiatric:        Mood and Affect: Mood is anxious. Affect is blunt. Affect is not labile, angry or inappropriate.        Speech: Speech normal.        Behavior: Behavior normal.  Thought Content: Thought content normal. Thought content does not include homicidal or suicidal ideation. Thought content does not include homicidal or suicidal plan.        Judgment: Judgment normal.     Comments: Insight intact. Dysphoric mood No auditory or visual hallucinations. No delusions.      Lab Review:     Component Value Date/Time   NA 141 03/10/2015 0015   K 4.2 03/10/2015 0015   CL 107 03/10/2015 0015   CO2 25 03/10/2015 0015   GLUCOSE 89 03/10/2015 0015   BUN 9 03/10/2015 0015   CREATININE 0.61 03/10/2015 0015   CALCIUM 9.2 03/10/2015 0015   PROT 6.9 03/10/2015 0015   ALBUMIN 4.0 03/10/2015 0015   AST 38 03/10/2015 0015   ALT 41 03/10/2015 0015   ALKPHOS 40 03/10/2015 0015   BILITOT 0.5 03/10/2015 0015   GFRNONAA >60 03/10/2015 0015   GFRAA >60 03/10/2015 0015       Component Value Date/Time   WBC 6.0 03/10/2015 0015   RBC 4.24 03/10/2015 0015   HGB 13.0 03/10/2015 0015   HCT 40.1 03/10/2015 0015   PLT 319 03/10/2015 0015   MCV 94.6 03/10/2015 0015   MCH 30.7 03/10/2015 0015   MCHC 32.4 03/10/2015 0015   RDW 12.4 03/10/2015 0015   LYMPHSABS 2.2 03/10/2015 0015   MONOABS 0.6 03/10/2015 0015   EOSABS 0.2 03/10/2015 0015   BASOSABS 0.1 03/10/2015 0015    No results found for: POCLITH, LITHIUM   No results found for: PHENYTOIN, PHENOBARB, VALPROATE, CBMZ   .res Assessment: Plan:   Discussed taking Buspar regularly twice daily to improve anxiety and that Buspar is used to prevent anxiety. Discussed re-starting Trileptal,  however pt prefers not to re-start Trileptal at this time.  Patient reports that she would prefer to restart BuSpar twice daily and use hydroxyzine and propranolol as needed for anxiety. Continue Adderall 10 mg 1/2 to 1 tablet p.o. twice daily for attention deficit since patient reports that Adderall is helpful for attention and has not caused any tolerability issues. Generalized anxiety disorder - Plan: busPIRone (BUSPAR) 15 MG tablet, hydrOXYzine (ATARAX/VISTARIL) 25 MG tablet, propranolol (INDERAL) 10 MG tablet  Attention deficit hyperactivity disorder (ADHD), combined type - Plan: amphetamine-dextroamphetamine (ADDERALL) 10 MG tablet, amphetamine-dextroamphetamine (ADDERALL) 10 MG tablet, amphetamine-dextroamphetamine (ADDERALL) 10 MG tablet  Primary insomnia - Plan: hydrOXYzine (ATARAX/VISTARIL) 25 MG tablet  Mood disorder (HCC)  Please see After Visit Summary for patient specific instructions.  Future Appointments  Date Time Provider [place]  11/06/2018  9:00 AM Thayer Headings, PMHNP [facility] None    No orders of the defined types were placed in this encounter.     -------------------------------

## 2018-08-09 ENCOUNTER — Encounter: Payer: Self-pay | Admitting: Psychiatry

## 2018-09-07 ENCOUNTER — Other Ambulatory Visit: Payer: Self-pay | Admitting: Psychiatry

## 2018-09-07 DIAGNOSIS — F411 Generalized anxiety disorder: Secondary | ICD-10-CM

## 2018-09-07 DIAGNOSIS — F5101 Primary insomnia: Secondary | ICD-10-CM

## 2018-09-14 ENCOUNTER — Telehealth: Payer: Self-pay | Admitting: Psychiatry

## 2018-09-14 DIAGNOSIS — F411 Generalized anxiety disorder: Secondary | ICD-10-CM

## 2018-09-14 NOTE — Telephone Encounter (Signed)
Patient going through divorce. Please call she need something (medication) to help her maintain.

## 2018-09-15 ENCOUNTER — Other Ambulatory Visit: Payer: Self-pay | Admitting: Psychiatry

## 2018-09-15 DIAGNOSIS — F5101 Primary insomnia: Secondary | ICD-10-CM

## 2018-09-15 DIAGNOSIS — F411 Generalized anxiety disorder: Secondary | ICD-10-CM

## 2018-09-15 MED ORDER — OXCARBAZEPINE 300 MG PO TABS: ORAL_TABLET | ORAL | 1 refills | Status: DC

## 2018-09-15 NOTE — Telephone Encounter (Signed)
Left voicemail to call back with more information 

## 2018-09-16 NOTE — Telephone Encounter (Signed)
Left detailed information about rx and to call back with any questions or concerns.

## 2018-09-22 LAB — CBC AND DIFFERENTIAL
HEMOGLOBIN: 14.3 (ref 12.0–16.0)
Platelets: 308 (ref 150–399)
WBC: 3.7

## 2018-09-22 LAB — HEPATIC FUNCTION PANEL
ALT: 61 — AB (ref 7–35)
AST: 98 — AB (ref 13–35)
Bilirubin, Total: 0.5

## 2018-09-22 LAB — HM PAP SMEAR

## 2018-09-23 LAB — BASIC METABOLIC PANEL
BUN: 6 (ref 4–21)
Creatinine: 0.9 (ref 0.5–1.1)
Glucose: 99
POTASSIUM: 5 (ref 3.4–5.3)
Sodium: 139 (ref 137–147)

## 2018-09-23 LAB — LIPID PANEL
Cholesterol: 127 (ref 0–200)
HDL: 65 (ref 35–70)
Triglycerides: 94 (ref 40–160)

## 2018-09-23 LAB — TSH: TSH: 3.08 (ref 0.41–5.90)

## 2018-09-25 ENCOUNTER — Encounter: Payer: Self-pay | Admitting: Family Medicine

## 2018-09-28 ENCOUNTER — Ambulatory Visit: Payer: Self-pay | Admitting: Family Medicine

## 2018-10-07 LAB — LIPID PANEL
Cholesterol: 127 (ref 0–200)
HDL: 65 (ref 35–70)
LDL Cholesterol: 44
Triglycerides: 94 (ref 40–160)

## 2018-10-07 LAB — BASIC METABOLIC PANEL
Creatinine: 0.9 (ref 0.5–1.1)
Glucose: 99
Potassium: 5 (ref 3.4–5.3)
Sodium: 139 (ref 137–147)

## 2018-10-07 LAB — HEPATIC FUNCTION PANEL
ALT: 61 — AB (ref 7–35)
AST: 98 — AB (ref 13–35)

## 2018-10-07 LAB — CBC AND DIFFERENTIAL: WBC: 3.7

## 2018-10-08 LAB — HM HEPATITIS C SCREENING LAB: HM Hepatitis Screen: NEGATIVE

## 2018-11-06 ENCOUNTER — Ambulatory Visit: Payer: 59 | Admitting: Psychiatry

## 2018-11-17 ENCOUNTER — Ambulatory Visit (INDEPENDENT_AMBULATORY_CARE_PROVIDER_SITE_OTHER): Payer: 59 | Admitting: Family Medicine

## 2018-11-17 ENCOUNTER — Other Ambulatory Visit: Payer: Self-pay

## 2018-11-17 ENCOUNTER — Encounter: Payer: Self-pay | Admitting: Family Medicine

## 2018-11-17 VITALS — Temp 98.5°F | Ht 66.0 in | Wt 123.0 lb

## 2018-11-17 DIAGNOSIS — R748 Abnormal levels of other serum enzymes: Secondary | ICD-10-CM

## 2018-11-17 DIAGNOSIS — F1020 Alcohol dependence, uncomplicated: Secondary | ICD-10-CM

## 2018-11-17 DIAGNOSIS — F4323 Adjustment disorder with mixed anxiety and depressed mood: Secondary | ICD-10-CM | POA: Diagnosis not present

## 2018-11-17 DIAGNOSIS — H5789 Other specified disorders of eye and adnexa: Secondary | ICD-10-CM

## 2018-11-17 DIAGNOSIS — R1084 Generalized abdominal pain: Secondary | ICD-10-CM | POA: Insufficient documentation

## 2018-11-17 DIAGNOSIS — R197 Diarrhea, unspecified: Secondary | ICD-10-CM | POA: Insufficient documentation

## 2018-11-17 DIAGNOSIS — F988 Other specified behavioral and emotional disorders with onset usually occurring in childhood and adolescence: Secondary | ICD-10-CM

## 2018-11-17 DIAGNOSIS — F430 Acute stress reaction: Secondary | ICD-10-CM

## 2018-11-17 DIAGNOSIS — Z7689 Persons encountering health services in other specified circumstances: Secondary | ICD-10-CM | POA: Diagnosis not present

## 2018-11-17 DIAGNOSIS — F410 Panic disorder [episodic paroxysmal anxiety] without agoraphobia: Secondary | ICD-10-CM

## 2018-11-17 HISTORY — DX: Diarrhea, unspecified: R19.7

## 2018-11-17 HISTORY — DX: Alcohol dependence, uncomplicated: F10.20

## 2018-11-17 MED ORDER — POLYMYXIN B-TRIMETHOPRIM 10000-0.1 UNIT/ML-% OP SOLN: 1.0000 [drp] | OPHTHALMIC | 0 refills | Status: DC

## 2018-11-17 NOTE — Patient Instructions (Addendum)
It was a pleasure to see you.  We will collect blood work asap and discuss further follow up after results.  Cutting back on your drinking is strongly recommended.  If you need help with this, we are available to you.

## 2018-11-17 NOTE — Progress Notes (Signed)
VIRTUAL VISIT VIA VIDEO  I connected with Leah Jones on 11/17/18 at  1:20 PM EDT by a video enabled telemedicine application and verified that I am speaking with the correct person using two identifiers. Location patient: Home Location provider: [facility], Office Persons participating in the virtual visit: Patient, Dr. Raoul Pitch and R.Baker, LPN  I discussed the limitations of evaluation and management by telemedicine and the availability of in person appointments. The patient expressed understanding and agreed to proceed.   SUBJECTIVE Chief Complaint  Patient presents with  . Establish Care    Pt had labs completed at GYN and had elevated liver enzymes. Pt also had abnormal pap, cervical biospy and this was normal. Report back to GYN x1 year and to F/U with PCP for liver enzymes. Pt did a OTC liver cleanse that she bought at the store x1 1/2 months ago     HPI: Leah Jones is a 58 y.o. female  present to re-establish care after greater than 3 years of absence.  Comes in today to discuss her elevated  liver enzymes discovered at her gynecology's office during  routine labs.  Does endorse daily alcohol use.  She states that she has been drinking daily at least 3 drinks today since she is going through a separation.  Since her liver enzymes collected 09/22/2018 in which her AST is 99 and her ALT was 61- normal total bili, she has tried a "liver cleanse ".  She endorses a decreased appetite and frequent nausea.  Most recently since Sunday she has had a hormonal headache, nausea, diarrhea, urinary frequency, upper abdominal pain that radiated to her back.  Reports the next day she started her menstrual cycle, which she has not had a full menstrual cycle in over a year.  She states she took an ibuprofen and felt better.  She denies any weight loss. Normal liver enzymes with prior collection sable to visualize in EMR up to 2016. She also started taking OTC NAC to help with her liver (after  elevation).  She also complains of left eye irritation, watering and itchiness. She saw her eye doc 2 months ago for this and was provided with eye drops and lubricant. She felt it got better for a very brief time,  But then returned. She denies fever, chills, or matted shut eyes. She has some very mild watering right eye, but no irritation.   Depression screen [facility] 2/9 11/17/2018  Decreased Interest 0  Down, Depressed, Hopeless 1  PHQ - 2 Score 1  Altered sleeping 0  Tired, decreased energy 1  Change in appetite 3  Feeling bad or failure about yourself  0  Trouble concentrating 0  Moving slowly or fidgety/restless 0  Suicidal thoughts 0  PHQ-9 Score 5  Difficult doing work/chores Not difficult at all   GAD 7 : Generalized Anxiety Score 11/17/2018  Nervous, Anxious, on Edge 1  Control/stop worrying 1  Worry too much - different things 1  Trouble relaxing 0  Restless 3  Easily annoyed or irritable 2  Afraid - awful might happen 0  Total GAD 7 Score 8  Anxiety Difficulty Not difficult at all   ROS: See pertinent positives and negatives per HPI.  Patient Active Problem List   Diagnosis Date Noted  . Injury of fifth finger, left 06/19/2015  . Mallet deformity of fifth finger, left, acquired 06/19/2015  . Adjustment disorder with mixed anxiety and depressed mood 03/12/2015  . Suicidal ideation   . Preventative  health care 05/20/2014  . Discoloration of skin of foot 05/20/2014  . Panic attack as reaction to stress 05/20/2014  . Eustachian tube dysfunction 01/25/2014  . Allergic rhinitis 01/25/2014  . ADD (attention deficit disorder) 01/17/2012  . CARPAL TUNNEL SYNDROME 12/01/2009  . B12 DEFICIENCY 04/15/2007  . ANAL FISSURE, HX OF 04/15/2007    Social History   Tobacco Use  . Smoking status: Current Some Day Smoker    Types: Cigarettes  . Smokeless tobacco: Never Used  Substance Use Topics  . Alcohol use: Yes    Comment: Drinking 3-4 days a week. Drinks a bottle of wine  or 3-4 beers.    Current Outpatient Medications:  .  Acetylcysteine (NAC) 500 MG CAPS, Take by mouth., Disp: , Rfl:  .  amphetamine-dextroamphetamine (ADDERALL) 10 MG tablet, Take 1/2-1 tab po BID, Disp: 60 tablet, Rfl: 0 .  Cyanocobalamin (VITAMIN B 12 PO), Take by mouth., Disp: , Rfl:  .  erythromycin ophthalmic ointment, Place a 1/2 inch ribbon of ointment into the lower eyelid of the left eye 4 times daily for the next 5 days., Disp: 1 g, Rfl: 0 .  estradiol (ESTRACE) 1 MG tablet, , Disp: , Rfl:  .  hydrOXYzine (ATARAX/VISTARIL) 25 MG tablet, TAKE 1/2 TO 1 TABLET BY MOUTH EVERY 6 HOURS AS NEEDED, Disp: 60 tablet, Rfl: 1 .  medroxyPROGESTERone (PROVERA) 2.5 MG tablet, Take 2.5 mg by mouth daily., Disp: , Rfl:  .  neomycin-polymyxin b-dexamethasone (MAXITROL) 3.5-10000-0.1 SUSP, INSTILL 1 DROP INTO EACH EYE 4 TIMES DAILY FOR 7 DAYS, Disp: , Rfl: 0 .  Oxcarbazepine (TRILEPTAL) 300 MG tablet, Take 1/2 tab po QHS x 3-5 days, then increase to 1 tab po QHS, Disp: 30 tablet, Rfl: 1 .  propranolol (INDERAL) 10 MG tablet, Take 1-2 tabs po BID prn anxiety, Disp: 120 tablet, Rfl: 2 .  valACYclovir (VALTREX) 500 MG tablet, Take 500 mg by mouth 2 (two) times daily., Disp: , Rfl:  .  busPIRone (BUSPAR) 15 MG tablet, Take 1 tablet (15 mg total) by mouth 2 (two) times daily., Disp: 180 tablet, Rfl: 1 .  fluticasone (FLONASE) 50 MCG/ACT nasal spray, Place 1 spray into both nostrils daily. (Patient not taking: Reported on 11/17/2018), Disp: 16 g, Rfl: 0 .  gabapentin (NEURONTIN) 300 MG capsule, Take 1 capsule (300 mg total) by mouth 3 (three) times daily. (Patient not taking: Reported on 11/17/2018), Disp: 90 capsule, Rfl: 3  No Known Allergies  OBJECTIVE: Temp 98.5 F (36.9 C) (Oral)   Ht 5' 6"  (1.676 m)   Wt 123 lb (55.8 kg)   BMI 19.85 kg/m  Gen: No acute distress. Nontoxic in appearance.  HENT: AT. .  MMM.  Eyes:Pupils Equal Round Reactive to light, Extraocular movements intact,  Conjunctiva  without redness, discharge or icterus. Chest: Cough or shortness of breath not present.  Skin: no rashes, purpura or petechiae.  Neuro:Alert. Oriented x3  Psych: Normal affect, dress and demeanor. Normal speech. Normal thought content and judgment.  ASSESSMENT AND PLAN: Kezia Benevides is a 58 y.o. female present for Re- establish care (> 3 years since last seen). Elevated liver enzymes/Diarrhea, unspecified type/Generalized abdominal pain/daily alcohol use-dependence -Patient was strongly encouraged to cut back on her alcohol consumption.  Avoid daily use.  Avoid more than 2 alcoholic drinks at a sitting.  She feels she will be able to do this without assistance secondary to it is now affecting her health. She has a h/o alcoholism.  -We discussed the most  likely etiology of the elevation in her liver enzymes is her alcohol daily use.  However we should rule out other potential causes with repeat lab work, infectious disease rule out, lipase and likely abdominal ultrasound. Her fhx of pancreatic cancer is also concerning  She was encouraged to follow-up with her gynecologist concerning her menstrual cycles. - Comp Met (CMET); Future - Hepatitis, Acute; Future - HIV antibody (with reflex); Future - Protime-INR ( [facility] ONLY); Future - Lipase; Future -Repeat labs for verification, if liver enzymes are not improved we will proceed with abdominal ultrasound  Adjustment disorder with mixed anxiety and depressed mood/Attention deficit disorder, unspecified hyperactivity presence/Panic attack as reaction to stress Managed by psychiatry team.  Eye irritation Agreed to call in abx op drops-Polytrim If not improved follow up with eye doctor.  Favor blepharitis over conjunctivitis, as cause of her symptoms. -[employer] baby shampoo warm compresses and cleanses twice a day encouraged.  Greater than 45 minutes was spent with patient, greater than 50% of that time was spent face-to-face with  patient      Howard Pouch, DO 11/17/2018

## 2018-11-18 MED ORDER — FLUTICASONE PROPIONATE 50 MCG/ACT NA SUSP: 2.0000 | Freq: Every day | NASAL | 6 refills | Status: DC

## 2018-11-18 NOTE — Addendum Note (Signed)
Addended by: Howard Pouch A on: 11/18/2018 12:37 PM   Modules accepted: Orders

## 2018-11-20 ENCOUNTER — Other Ambulatory Visit (INDEPENDENT_AMBULATORY_CARE_PROVIDER_SITE_OTHER): Payer: 59

## 2018-11-20 ENCOUNTER — Encounter: Payer: Self-pay | Admitting: Family Medicine

## 2018-11-20 DIAGNOSIS — R1084 Generalized abdominal pain: Secondary | ICD-10-CM | POA: Diagnosis not present

## 2018-11-20 DIAGNOSIS — R197 Diarrhea, unspecified: Secondary | ICD-10-CM | POA: Diagnosis not present

## 2018-11-20 DIAGNOSIS — R748 Abnormal levels of other serum enzymes: Secondary | ICD-10-CM

## 2018-11-20 LAB — CBC WITH DIFFERENTIAL/PLATELET
Basophils Absolute: 0.1 10*3/uL (ref 0.0–0.1)
Basophils Relative: 1.3 % (ref 0.0–3.0)
Eosinophils Absolute: 0.2 10*3/uL (ref 0.0–0.7)
Eosinophils Relative: 2.8 % (ref 0.0–5.0)
HCT: 38.8 % (ref 36.0–46.0)
Hemoglobin: 13.2 g/dL (ref 12.0–15.0)
Lymphocytes Relative: 20.1 % (ref 12.0–46.0)
Lymphs Abs: 1.4 10*3/uL (ref 0.7–4.0)
MCHC: 34.1 g/dL (ref 30.0–36.0)
MCV: 98.8 fl (ref 78.0–100.0)
Monocytes Absolute: 0.8 10*3/uL (ref 0.1–1.0)
Monocytes Relative: 11.6 % (ref 3.0–12.0)
Neutro Abs: 4.6 10*3/uL (ref 1.4–7.7)
Neutrophils Relative %: 64.2 % (ref 43.0–77.0)
Platelets: 315 10*3/uL (ref 150.0–400.0)
RBC: 3.93 Mil/uL (ref 3.87–5.11)
RDW: 13.2 % (ref 11.5–15.5)
WBC: 7.1 10*3/uL (ref 4.0–10.5)

## 2018-11-20 LAB — COMPREHENSIVE METABOLIC PANEL
ALT: 22 U/L (ref 0–35)
AST: 29 U/L (ref 0–37)
Albumin: 4 g/dL (ref 3.5–5.2)
Alkaline Phosphatase: 51 U/L (ref 39–117)
BUN: 6 mg/dL (ref 6–23)
CO2: 26 mEq/L (ref 19–32)
Calcium: 8.5 mg/dL (ref 8.4–10.5)
Chloride: 92 mEq/L — ABNORMAL LOW (ref 96–112)
Creatinine, Ser: 0.61 mg/dL (ref 0.40–1.20)
GFR: 101.66 mL/min (ref >60.00)
Glucose, Bld: 76 mg/dL (ref 70–99)
Potassium: 4.5 mEq/L (ref 3.5–5.1)
Sodium: 127 mEq/L — ABNORMAL LOW (ref 135–145)
Total Bilirubin: 0.9 mg/dL (ref 0.2–1.2)
Total Protein: 6.4 g/dL (ref 6.0–8.3)

## 2018-11-20 LAB — LIPASE: Lipase: 40 U/L (ref 11.0–59.0)

## 2018-11-20 LAB — PROTIME-INR
INR: 1 ratio (ref 0.8–1.0)
Prothrombin Time: 11.6 s (ref 9.6–13.1)

## 2018-11-23 LAB — HEPATITIS PANEL, ACUTE
Hep A IgM: NONREACTIVE
Hep B C IgM: NONREACTIVE
Hepatitis B Surface Ag: NONREACTIVE
Hepatitis C Ab: NONREACTIVE
SIGNAL TO CUT-OFF: 0.05 (ref <1.00)

## 2018-11-23 LAB — HIV ANTIBODY (ROUTINE TESTING W REFLEX): HIV 1&2 Ab, 4th Generation: NONREACTIVE

## 2018-11-24 ENCOUNTER — Telehealth: Payer: Self-pay | Admitting: Family Medicine

## 2018-11-24 DIAGNOSIS — F1020 Alcohol dependence, uncomplicated: Secondary | ICD-10-CM

## 2018-11-24 DIAGNOSIS — E871 Hypo-osmolality and hyponatremia: Secondary | ICD-10-CM

## 2018-11-24 DIAGNOSIS — R748 Abnormal levels of other serum enzymes: Secondary | ICD-10-CM

## 2018-11-24 DIAGNOSIS — R630 Anorexia: Secondary | ICD-10-CM

## 2018-11-24 DIAGNOSIS — R1084 Generalized abdominal pain: Secondary | ICD-10-CM

## 2018-11-24 DIAGNOSIS — Z800 Family history of malignant neoplasm of digestive organs: Secondary | ICD-10-CM

## 2018-11-24 MED ORDER — ONDANSETRON HCL 4 MG PO TABS: 4.0000 mg | ORAL_TABLET | Freq: Three times a day (TID) | ORAL | 0 refills | Status: DC | PRN

## 2018-11-24 NOTE — Telephone Encounter (Signed)
Pt was called and she was given all information. She is already taking a B complex but does not remember everyday to take it. Pt verbalized understanding on plan, will come Friday to pick up urine kit/jug and instructions between 9-11am and appt was made for the following Monday morning to draw labs and drop urine off. Pt agreeable.  Zofran to [facility] @ [place]

## 2018-11-24 NOTE — Telephone Encounter (Signed)
Pt states her main complaint is the lack of appetite with fatigue. Last week she was nauseated, never vomited, and had the back pain from one side of her stomach to other but that has not had happened since Saturday.   Please advise

## 2018-11-24 NOTE — Telephone Encounter (Signed)
LM for patient to return call.

## 2018-11-24 NOTE — Addendum Note (Signed)
Addended by: Howard Pouch A on: 11/24/2018 04:01 PM   Modules accepted: Orders

## 2018-11-24 NOTE — Telephone Encounter (Signed)
-  I would encourage her to start a B-Complex OTC. With increased alcohol use B vitamins get very low- causing fatigue and even nausea. I would encourage her to start this immediatly.   -I would also recommend she start a drinking a 1-2 Gatorade a day to start replacing electrolytes.  -Continue to cut back on alcohol use. If needing assistance I would be happy to help her (schedule virtual).  -I have ordered an [place] of her abdomen to better evaluate her symptoms. They will call to schedule.  - I can call in zofran for her to help w/ nausea- please have her pick a pharmacy--> she has 3 in the system. DC others.  - we will also need her to perform a 24 hour urine sample and have blood drawn the day she drops off the urine. This requires a collection kit and special instructions. Please make sure she gets them both and is placed on lab schedule to drop urine and get lab.    If she has worsening symptoms-> nausea, vomit, headache, confusion, muscle weakness--> these are signs to go to ED immediately. When sodium drops very low the above symptoms can precede seizures. If sodium is truly this low, it also has to be replaced properly under medical supervision.

## 2018-11-24 NOTE — Addendum Note (Signed)
Addended by: Howard Pouch A on: 11/24/2018 03:44 PM   Modules accepted: Orders

## 2018-11-24 NOTE — Telephone Encounter (Signed)
Please inform patient the following information: Her liver enzymes are now normal. Her blood count is normal.  No signs of anemia. Her hepatitis panel and HIV screening, which we complete for elevated liver enzymes are negative/normal.  She did have rather low sodium levels.  The sodium can be secondary to many reasons.  Especially if she had nausea and vomiting. I am concerned she may have had pancreatitis as the cause of her some of abd pain and nausea. Sodium this low needs additional work up.   Is she still having nausea? Or abd/back pain? Is she experiencing nausea and vomit?  Please advise

## 2018-11-30 ENCOUNTER — Other Ambulatory Visit: Payer: Self-pay

## 2018-11-30 ENCOUNTER — Other Ambulatory Visit (INDEPENDENT_AMBULATORY_CARE_PROVIDER_SITE_OTHER): Payer: 59

## 2018-11-30 DIAGNOSIS — E871 Hypo-osmolality and hyponatremia: Secondary | ICD-10-CM

## 2018-11-30 NOTE — Progress Notes (Signed)
Patient had lab orders in for 3 different urine testing site.  I was only able to modify the sodium 24 hour urine in order to get 2 of 3 tests.   Per [facility], Lab code [id number] is a sodium 24 hour urine test.  I had to order it in Epic as Misc/ Other testing and add test code 838.   [facility] states they don't have any test that resembles Fractional excretion-na test.

## 2018-12-03 LAB — SODIUM,24 HOUR URINE WITH CREATININE
Creatinine, 24H Ur: 0.92 g/(24.h) (ref 0.50–2.15)
Sodium, 24H Ur: 81 mmol/24 h (ref 52–380)
Sodium/Creat Ratio: 88 mmol/g creat (ref 50–230)

## 2018-12-03 LAB — OSMOLALITY, URINE: Osmolality, Ur: 222 mOsm/kg (ref 50–1200)

## 2018-12-03 LAB — COMPREHENSIVE METABOLIC PANEL
AG Ratio: 1.9 (calc) (ref 1.0–2.5)
ALT: 24 U/L (ref 6–29)
AST: 29 U/L (ref 10–35)
Albumin: 4.1 g/dL (ref 3.6–5.1)
Alkaline phosphatase (APISO): 47 U/L (ref 37–153)
BUN: 8 mg/dL (ref 7–25)
CO2: 24 mmol/L (ref 20–32)
Calcium: 9.3 mg/dL (ref 8.6–10.4)
Chloride: 99 mmol/L (ref 98–110)
Creat: 0.73 mg/dL (ref 0.50–1.05)
Globulin: 2.2 g/dL (calc) (ref 1.9–3.7)
Glucose, Bld: 100 mg/dL — ABNORMAL HIGH (ref 65–99)
Potassium: 5 mmol/L (ref 3.5–5.3)
Sodium: 132 mmol/L — ABNORMAL LOW (ref 135–146)
Total Bilirubin: 0.6 mg/dL (ref 0.2–1.2)
Total Protein: 6.3 g/dL (ref 6.1–8.1)

## 2018-12-04 ENCOUNTER — Ambulatory Visit
Admission: RE | Admit: 2018-12-04 | Discharge: 2018-12-04 | Disposition: A | Discharge status: Home / Self Care | Payer: 59 | Source: Ambulatory Visit | Attending: Family Medicine | Admitting: Family Medicine

## 2018-12-04 DIAGNOSIS — R1084 Generalized abdominal pain: Secondary | ICD-10-CM

## 2018-12-04 DIAGNOSIS — F1020 Alcohol dependence, uncomplicated: Secondary | ICD-10-CM

## 2018-12-04 DIAGNOSIS — Z800 Family history of malignant neoplasm of digestive organs: Secondary | ICD-10-CM

## 2018-12-04 DIAGNOSIS — R748 Abnormal levels of other serum enzymes: Secondary | ICD-10-CM

## 2018-12-04 DIAGNOSIS — R630 Anorexia: Secondary | ICD-10-CM

## 2018-12-09 ENCOUNTER — Encounter: Payer: Self-pay | Admitting: Family Medicine

## 2018-12-09 ENCOUNTER — Other Ambulatory Visit: Payer: Self-pay

## 2018-12-09 ENCOUNTER — Telehealth: Payer: Self-pay | Admitting: Family Medicine

## 2018-12-09 ENCOUNTER — Ambulatory Visit (INDEPENDENT_AMBULATORY_CARE_PROVIDER_SITE_OTHER): Payer: 59 | Admitting: Family Medicine

## 2018-12-09 DIAGNOSIS — F1020 Alcohol dependence, uncomplicated: Secondary | ICD-10-CM | POA: Diagnosis not present

## 2018-12-09 DIAGNOSIS — F4323 Adjustment disorder with mixed anxiety and depressed mood: Secondary | ICD-10-CM

## 2018-12-09 DIAGNOSIS — F410 Panic disorder [episodic paroxysmal anxiety] without agoraphobia: Secondary | ICD-10-CM

## 2018-12-09 DIAGNOSIS — Z7141 Alcohol abuse counseling and surveillance of alcoholic: Secondary | ICD-10-CM

## 2018-12-09 DIAGNOSIS — F430 Acute stress reaction: Secondary | ICD-10-CM

## 2018-12-09 MED ORDER — QUETIAPINE FUMARATE 300 MG PO TABS: ORAL_TABLET | ORAL | 0 refills | Status: DC

## 2018-12-09 MED ORDER — NALTREXONE HCL 50 MG PO TABS: 50.0000 mg | ORAL_TABLET | Freq: Every day | ORAL | 0 refills | Status: DC

## 2018-12-09 NOTE — Progress Notes (Signed)
VIRTUAL VISIT VIA VIDEO  I connected with Leah Jones on 12/09/18 at  8:20 AM EDT by a video enabled telemedicine application and verified that I am speaking with the correct person using two identifiers. Location patient: Home Location provider: [facility], Office Persons participating in the virtual visit: Patient, Dr. Raoul Pitch and R.Baker, LPN  I discussed the limitations of evaluation and management by telemedicine and the availability of in person appointments. The patient expressed understanding and agreed to proceed.   SUBJECTIVE Chief Complaint  Patient presents with  . Alcohol Dependency    Pt would like to discuss drinking and ways to stop drinking. Pt is unable to take vital signs.     HPI: Leah Jones is a 58 y.o. year old female present today to discuss alcohol dependency and anxiety.  Patient reports she has used alcohol as a crutch for a long time.  She is recently separated from her husband secondary to her drinking.  Since being separated she has increased her drinking to cope with the separation.  She understands this is a vicious cycle for her.  She currently is drinking a bottle of wine +2-3 beers (or a vodka drink) daily.  She is currently under counseling for her anxiety and panic [facility], and was prescribed BuSpar, Vistaril, Trileptal.  She reports not taking any of these medications for some time, and the Trileptal she has not taken for the past week.  She did not feel the Trileptal was helpful for her.  He has great difficulty sleeping at night.  Her last drink was last night.  In discussing group therapy, patient states that she is "not in any AA person."She does not feel that she would be able to stand up in front of a group and discuss her issues.  She also is not interested in inpatient therapy.  She is interested in seeking care from a different therapist for her anxiety and her separation.  She feels that she has better coping mechanisms for those  issues, then she will not fall back on alcohol as a crutch.  ROS: See pertinent positives and negatives per HPI.  Patient Active Problem List   Diagnosis Date Noted  . Generalized abdominal pain 11/17/2018  . Eye irritation 11/17/2018  . Diarrhea 11/17/2018  . Elevated liver enzymes 11/17/2018  . Alcohol dependence, daily use ([place]) 11/17/2018  . Adjustment disorder with mixed anxiety and depressed mood 03/12/2015  . Panic attack as reaction to stress 05/20/2014  . ADD (attention deficit disorder) 01/17/2012  . B12 DEFICIENCY 04/15/2007    Social History   Tobacco Use  . Smoking status: Current Some Day Smoker    Types: Cigarettes  . Smokeless tobacco: Never Used  Substance Use Topics  . Alcohol use: Yes    Comment: Drinking 6 days a week. Drinks a bottle of wine, beer, or liquor     Current Outpatient Medications:  .  Acetylcysteine (NAC) 500 MG CAPS, Take by mouth., Disp: , Rfl:  .  Cyanocobalamin (VITAMIN B 12 PO), Take by mouth., Disp: , Rfl:  .  estradiol (ESTRACE) 1 MG tablet, , Disp: , Rfl:  .  medroxyPROGESTERone (PROVERA) 2.5 MG tablet, Take 2.5 mg by mouth daily., Disp: , Rfl:  .  propranolol (INDERAL) 10 MG tablet, Take 1-2 tabs po BID prn anxiety, Disp: 120 tablet, Rfl: 2 .  valACYclovir (VALTREX) 500 MG tablet, Take 500 mg by mouth 2 (two) times daily., Disp: , Rfl:  .  busPIRone (BUSPAR)  15 MG tablet, Take 1 tablet (15 mg total) by mouth 2 (two) times daily., Disp: 180 tablet, Rfl: 1 .  naltrexone (DEPADE) 50 MG tablet, Take 1 tablet (50 mg total) by mouth daily., Disp: 30 tablet, Rfl: 0 .  QUEtiapine (SEROQUEL) 300 MG tablet, 1/2 tab QHS for 3 nights, then 1 tab QHS, Disp: 90 tablet, Rfl: 0  No Known Allergies  OBJECTIVE: There were no vitals taken for this visit. Gen: No acute distress. Nontoxic in appearance.  HENT: AT. .  MMM.  Eyes:Pupils Equal Round Reactive to light, Extraocular movements intact,  Conjunctiva without redness, discharge or icterus.  CV: No edema Chest: Cough or shortness of breath not present on exam Skin: No rashes, purpura or petechiae.  Neuro:  Normal gait. Alert. Oriented x3  Psych: Normal affect, dress and demeanor. Normal speech. Normal thought content and judgment.  ASSESSMENT AND PLAN: Leah Jones is a 58 y.o. female present for  Alcohol abuse counseling and surveillance/Alcohol dependence, daily use ([place]) -Lengthy discussion today surrounding alcohol cessation and treatment options available. -Patient declined a group therapy, but was provided with the [facility] online [employer]. -Patient declined inpatient cessation and therapy.  She was educated on need for emergent care surrounding alcohol withdrawal syndrome. -She was encouraged to pick a quit date 2-3 weeks from now, circle it on the calendar.  Continue to decrease her alcohol consumption up to that date.  Night before selected date she is to ensure there is no alcohol in the home. -Patient will look into her insurance coverage on therapy/psychologist and if agreeable refer to Dr. Gaynell Face to provide coping mechanisms for her anxiety and help her through her feelings surrounding her separation. -Start naltrexone 50 mg daily, will follow-up in 2 weeks if needed can increase dose to 100 mg at that time. -Start Seroquel half tab nightly, then increase to 1 tab in 3 days if needed only.  Will reassess in 2 weeks and if needed can either increase dose or increase frequency of dose. -Consider gabapentin if needed for withdrawal, patient currently wants to try without medication and tapering and is aware to call if she notices symptoms of withdrawal and she was educated on symptoms to present emergently to ED. - naltrexone (DEPADE) 50 MG tablet; Take 1 tablet (50 mg total) by mouth daily.  Dispense: 30 tablet; Refill: 0 - QUEtiapine (SEROQUEL) 300 MG tablet; 1/2 tab QHS for 3 nights, then 1 tab QHS  Dispense: 90 tablet; Refill: 0 -Follow-up 2 weeks, if  doing well we will follow-up with her every 4 weeks.   Panic attack as reaction to stress/Adjustment disorder with mixed anxiety and depressed mood -Discontinue Trileptal since she has not been using it routinely.  Replace with Seroquel.  Taper instructions provided. -Restart BuSpar. - QUEtiapine (SEROQUEL) 300 MG tablet; 1/2 tab QHS for 3 nights, then 1 tab QHS  Dispense: 90 tablet; Refill: 0  Greater than 40 minutes spent with patient, >50% of time spent face to face counseling and coordinating care.    Howard Pouch, DO 12/09/2018

## 2018-12-09 NOTE — Patient Instructions (Signed)
Alcohol Withdrawal Syndrome When a person who drinks a lot of alcohol stops drinking, he or she may have unpleasant and serious symptoms. These symptoms are called alcohol withdrawal syndrome. This condition may be mild or severe. It can be life-threatening. It can cause:  Shaking that you cannot control (tremor).  Sweating.  Headache.  Feeling fearful, upset, grouchy, or depressed.  Trouble sleeping (insomnia).  Nightmares.  Fast or uneven heartbeats (palpitations).  Alcohol cravings.  Feeling sick to your stomach (nausea).  Throwing up (vomiting).  Being bothered by light and sounds.  Confusion.  Trouble thinking clearly.  Not being hungry (loss of appetite).  Big changes in mood (mood swings). If you have all of the following symptoms at the same time, get help right away:  High blood pressure.  Fast heartbeat.  Trouble breathing.  Seizures.  Seeing, hearing, feeling, smelling, or tasting things that are not there (hallucinations). These symptoms are known as delirium tremens (DTs). They must be treated at the hospital right away. Follow these instructions at home:   Take over-the-counter and prescription medicines only as told by your doctor. This includes vitamins.  Do not drink alcohol.  Do not drive until your doctor says that this is safe for you.  Have someone stay with you or be available in case you need help. This should be someone you trust. This person can help you with your symptoms. He or she can also help you to not drink.  Drink enough fluid to keep your pee (urine) pale yellow.  Think about joining a support group or a treatment program to help you stop drinking.  Keep all follow-up visits as told by your doctor. This is important. Contact a doctor if:  Your symptoms get worse.  You cannot eat or drink without throwing up.  You have a hard time not drinking alcohol.  You cannot stop drinking alcohol. Get help right away if:   You have fast or uneven heartbeats.  You have chest pain.  You have trouble breathing.  You have a seizure for the first time.  You see, hear, feel, smell, or taste something that is not there.  You get very confused. Summary  When a person who drinks a lot of alcohol stops drinking, he or she may have serious symptoms. This is called alcohol withdrawal syndrome.  Delirium tremens (DTs) is a group of life-threatening symptoms. You should get help right away if you have these symptoms.  Think about joining an alcohol support group or a treatment program. This information is not intended to replace advice given to you by your health care provider. Make sure you discuss any questions you have with your health care provider. Document Released: 12/25/2007 Document Revised: 03/14/2017 Document Reviewed: 03/14/2017 Elsevier Interactive Patient Education  2019 [employer].

## 2018-12-09 NOTE — Telephone Encounter (Signed)
Please contact Leah Jones. I attempted to call her to discuss, but was unable to get a hold of her.  - We discussed picking a quit date, and tapering off alcohol use. If she does want to quit all together- without taper (abruptly)- there is a med she can take for the 4-7 days during that process to help prevent alcohol withdrawal symptoms- it can make some people tired though so I would recommend starting this process on days she does not work or taking a couple days off.  If she decides she would like that that option I would be happy to call in the med for her- otherwise Ill assume she wants to continue as we discussed today.  If she asks it is gabapentin.

## 2018-12-10 NOTE — Telephone Encounter (Signed)
Pt was called and message was left to return call  

## 2018-12-10 NOTE — Telephone Encounter (Signed)
Pt was called and given information. She does not want to start Gabapentin now, she is going to look up med and cost first. Pt was scheduled for F/U appt and will call back if she decides to start medication

## 2018-12-16 ENCOUNTER — Telehealth: Payer: Self-pay | Admitting: Psychiatry

## 2018-12-16 NOTE — Telephone Encounter (Signed)
Patient called and said that she needs a refill on her adderall 10 mg 2 x daily sent to the [facility] on [address]. She has an appt in june

## 2018-12-17 NOTE — Telephone Encounter (Signed)
Left detailed message and to call back with any questions.

## 2018-12-25 ENCOUNTER — Telehealth: Payer: Self-pay | Admitting: Psychiatry

## 2018-12-25 NOTE — Telephone Encounter (Signed)
Pt left voicemail stating she needed to make an appt before she could receive a refill on her ADD meds. Appt was made on 6/24. Please refill as soon as possible.

## 2018-12-25 NOTE — Telephone Encounter (Signed)
No she has to be seen to get the prescription.

## 2018-12-29 ENCOUNTER — Telehealth: Payer: Self-pay

## 2018-12-29 ENCOUNTER — Encounter: Payer: Self-pay | Admitting: Family Medicine

## 2018-12-29 ENCOUNTER — Ambulatory Visit (INDEPENDENT_AMBULATORY_CARE_PROVIDER_SITE_OTHER): Payer: 59 | Admitting: Family Medicine

## 2018-12-29 ENCOUNTER — Other Ambulatory Visit: Payer: Self-pay

## 2018-12-29 ENCOUNTER — Ambulatory Visit: Payer: 59 | Admitting: Family Medicine

## 2018-12-29 VITALS — Ht 66.0 in

## 2018-12-29 DIAGNOSIS — F1020 Alcohol dependence, uncomplicated: Secondary | ICD-10-CM

## 2018-12-29 DIAGNOSIS — Z7141 Alcohol abuse counseling and surveillance of alcoholic: Secondary | ICD-10-CM

## 2018-12-29 MED ORDER — NALTREXONE HCL 50 MG PO TABS: 100.0000 mg | ORAL_TABLET | Freq: Every day | ORAL | 0 refills | Status: DC

## 2018-12-29 MED ORDER — GABAPENTIN 100 MG PO CAPS: ORAL_CAPSULE | ORAL | 0 refills | Status: DC

## 2018-12-29 NOTE — Telephone Encounter (Signed)
Pt was called after appt per Dr Raoul Pitch to give the following instructions: please schedule Leah Jones for 4 weeks. Tell her I called in gabapentin taper- with tapering up every 3 days>> she only needs to taper up if needed not if she is doing well on lower dose. Also with the naltrexone, insurance covers the 50 mg tab- so I refilled the 50 mg (she can take 2 a day, which is 100 mg). She can also take one a day (50 mg) on easire days, and only take 2 (100 mg) on days more coverage is needed  Pt was called and message was left with instructions and to call back to schedule 4 week F/U appt.

## 2018-12-29 NOTE — Progress Notes (Signed)
VIRTUAL VISIT VIA VIDEO  I connected with Leah Jones on 12/29/18 at  8:30 AM EDT by a video enabled telemedicine application and verified that I am speaking with the correct person using two identifiers. Location patient: Home Location provider: [facility], Office Persons participating in the virtual visit: Patient, Dr. Raoul Pitch and R.Baker, LPN  I discussed the limitations of evaluation and management by telemedicine and the availability of in person appointments. The patient expressed understanding and agreed to proceed.   SUBJECTIVE Chief Complaint  Patient presents with  . Follow-up    Pt does not like the medication, Seroquel. Pt took half a tab the first night and it woke her woke her up in the middle of night not feeling good and like "her legs would not work".     HPI: Leah Jones is a 58 y.o. year old female present today to follow up on  alcohol dependency and anxiety.   Patient reports the Seroquel even at 75 mg dose caused her to feel very drowsy and her legs weak.  She tried it for approximately 6 nights and could not tolerate the medication.  She is taking the naltrexone 50 mg daily.  She feels this is working some days to help curb her appetite for alcohol and on other days it is not enough.  She reports she has cut back on drinking since her last appointment.  She has gone 2 to 3 days without drinking which she has not done in a very long time.  She had a drink last night when her realtor came over she drank a bottle wine herself and shared a bottle with her realtor.  She went to the beach last week and reports she did have some alcohol, but not as much as she normally would.  Typically she would had drinks all through the day and evening when at the beach.  She reports she only had a drink of alcohol with her evening meals.  She reports after she drinks she does get mad at herself.  And she would like to continue to cut back or quit.  Prior note 12/09/2018:  Patient  reports she has used alcohol as a crutch for a long time.  She is recently separated from her husband secondary to her drinking.  Since being separated she has increased her drinking to cope with the separation.  She understands this is a vicious cycle for her.  She currently is drinking a bottle of wine +2-3 beers (or a vodka drink) daily.  She is currently under counseling for her anxiety and panic [facility], and was prescribed BuSpar, Vistaril, Trileptal.  She reports not taking any of these medications for some time, and the Trileptal she has not taken for the past week.  She did not feel the Trileptal was helpful for her.  He has great difficulty sleeping at night.  Her last drink was last night.  In discussing group therapy, patient states that she is "not in any AA person."She does not feel that she would be able to stand up in front of a group and discuss her issues.  She also is not interested in inpatient therapy.  She is interested in seeking care from a different therapist for her anxiety and her separation.  She feels that she has better coping mechanisms for those issues, then she will not fall back on alcohol as a crutch.  ROS: See pertinent positives and negatives per HPI.  Patient Active Problem List  Diagnosis Date Noted  . Generalized abdominal pain 11/17/2018  . Eye irritation 11/17/2018  . Diarrhea 11/17/2018  . Elevated liver enzymes 11/17/2018  . Alcohol dependence, daily use ([place]) 11/17/2018  . Adjustment disorder with mixed anxiety and depressed mood 03/12/2015  . Panic attack as reaction to stress 05/20/2014  . ADD (attention deficit disorder) 01/17/2012  . B12 DEFICIENCY 04/15/2007    Social History   Tobacco Use  . Smoking status: Current Some Day Smoker    Types: Cigarettes  . Smokeless tobacco: Never Used  Substance Use Topics  . Alcohol use: Yes    Comment: Drinking 6 days a week. Drinks a bottle of wine, beer, or liquor     Current Outpatient  Medications:  .  Acetylcysteine (NAC) 500 MG CAPS, Take by mouth., Disp: , Rfl:  .  Cyanocobalamin (VITAMIN B 12 PO), Take by mouth., Disp: , Rfl:  .  estradiol (ESTRACE) 1 MG tablet, , Disp: , Rfl:  .  medroxyPROGESTERone (PROVERA) 2.5 MG tablet, Take 2.5 mg by mouth daily., Disp: , Rfl:  .  naltrexone (DEPADE) 50 MG tablet, Take 1 tablet (50 mg total) by mouth daily., Disp: 30 tablet, Rfl: 0 .  propranolol (INDERAL) 10 MG tablet, Take 1-2 tabs po BID prn anxiety, Disp: 120 tablet, Rfl: 2 .  valACYclovir (VALTREX) 500 MG tablet, Take 500 mg by mouth 2 (two) times daily., Disp: , Rfl:  .  busPIRone (BUSPAR) 15 MG tablet, Take 1 tablet (15 mg total) by mouth 2 (two) times daily., Disp: 180 tablet, Rfl: 1 .  QUEtiapine (SEROQUEL) 300 MG tablet, 1/2 tab QHS for 3 nights, then 1 tab QHS (Patient not taking: Reported on 12/29/2018), Disp: 90 tablet, Rfl: 0  No Known Allergies  OBJECTIVE: Ht 5\' 6"  (1.676 m)   BMI 19.85 kg/m  Gen: Afebrile. No acute distress.  HENT: AT. [place].  MMM.  Eyes:Pupils Equal Round Reactive to light, Extraocular movements intact,  Conjunctiva without redness, discharge or icterus. Skin: no rashes, purpura or petechiae.  Neuro:  Normal gait. PERLA. EOMi. Alert. Oriented.  Psych: Normal affect, dress and demeanor. Normal speech. Normal thought content and judgment.   Depression screen [facility] 2/9 11/17/2018  Decreased Interest 0  Down, Depressed, Hopeless 1  PHQ - 2 Score 1  Altered sleeping 0  Tired, decreased energy 1  Change in appetite 3  Feeling bad or failure about yourself  0  Trouble concentrating 0  Moving slowly or fidgety/restless 0  Suicidal thoughts 0  PHQ-9 Score 5  Difficult doing work/chores Not difficult at all   GAD 7 : Generalized Anxiety Score 11/17/2018  Nervous, Anxious, on Edge 1  Control/stop worrying 1  Worry too much - different things 1  Trouble relaxing 0  Restless 3  Easily annoyed or irritable 2  Afraid - awful might happen 0  Total  GAD 7 Score 8  Anxiety Difficulty Not difficult at all    ASSESSMENT AND PLAN: Leah Jones is a 58 y.o. female present for  Alcohol abuse counseling and surveillance/Alcohol dependence, daily use ([facility]) -Continue naltrexone at 50-100 mg daily.  Patient was encouraged to take 50 mg a day and could take 100 mg a day on more tempting days. -Start gabapentin 100 mg nightly, taper every 3 days up to 300 mg nightly as needed. -Discontinued Seroquel. -Continue BuSpar and treatment with psychiatry.   -Had been on Trileptal but it was not working for her. -Patient declined a group therapy, but was provided with  the [employer]. -Patient declined inpatient cessation and therapy.  She was educated on need for emergent care surrounding alcohol withdrawal syndrome. -Patient will look into her insurance coverage on therapy/psychologist and if agreeable refer to Dr. Gaynell Face to provide coping mechanisms for her anxiety and help her through her feelings surrounding her separation - f/u 4 weeks.    > 25 minutes spent with patient, >50% of time spent face to face     Howard Pouch, DO 12/29/2018

## 2019-01-13 ENCOUNTER — Other Ambulatory Visit: Payer: Self-pay | Admitting: Psychiatry

## 2019-01-13 ENCOUNTER — Telehealth: Payer: Self-pay | Admitting: Psychiatry

## 2019-01-13 ENCOUNTER — Ambulatory Visit: Payer: 59 | Admitting: Psychiatry

## 2019-01-13 DIAGNOSIS — F902 Attention-deficit hyperactivity disorder, combined type: Secondary | ICD-10-CM

## 2019-01-13 MED ORDER — AMPHETAMINE-DEXTROAMPHETAMINE 10 MG PO TABS: ORAL_TABLET | ORAL | 0 refills | Status: DC

## 2019-01-13 NOTE — Telephone Encounter (Signed)
Patient need refill on add medication appointment was canceled (provider out)

## 2019-01-13 NOTE — Telephone Encounter (Signed)
Patient need refill on add medication to be sent to [facility], patient appointment was canceled via (provider out)

## 2019-02-15 ENCOUNTER — Encounter: Payer: Self-pay | Admitting: Psychiatry

## 2019-02-15 ENCOUNTER — Ambulatory Visit (INDEPENDENT_AMBULATORY_CARE_PROVIDER_SITE_OTHER): Payer: 59 | Admitting: Psychiatry

## 2019-02-15 ENCOUNTER — Other Ambulatory Visit: Payer: Self-pay

## 2019-02-15 DIAGNOSIS — F902 Attention-deficit hyperactivity disorder, combined type: Secondary | ICD-10-CM

## 2019-02-15 DIAGNOSIS — F5101 Primary insomnia: Secondary | ICD-10-CM | POA: Diagnosis not present

## 2019-02-15 DIAGNOSIS — F411 Generalized anxiety disorder: Secondary | ICD-10-CM

## 2019-02-15 MED ORDER — AMPHETAMINE-DEXTROAMPHETAMINE 10 MG PO TABS: ORAL_TABLET | ORAL | 0 refills | Status: DC

## 2019-02-15 MED ORDER — PROPRANOLOL HCL 10 MG PO TABS: ORAL_TABLET | ORAL | 2 refills | Status: DC

## 2019-02-15 MED ORDER — HYDROXYZINE HCL 25 MG PO TABS: 25.0000 mg | ORAL_TABLET | Freq: Four times a day (QID) | ORAL | 2 refills | Status: AC | PRN

## 2019-02-15 MED ORDER — AMPHETAMINE-DEXTROAMPHETAMINE 10 MG PO TABS: 10.0000 mg | ORAL_TABLET | Freq: Two times a day (BID) | ORAL | 0 refills | Status: DC

## 2019-02-15 NOTE — Progress Notes (Signed)
Leah Jones [id number] June 29, 1961 58 y.o.  Virtual Visit via Telephone Note  I connected with pt on 02/15/19 at 11:30 AM EDT by telephone and verified that I am speaking with the correct person using two identifiers.   I discussed the limitations, risks, security and privacy concerns of performing an evaluation and management service by telephone and the availability of in person appointments. I also discussed with the patient that there may be a patient responsible charge related to this service. The patient expressed understanding and agreed to proceed.   I discussed the assessment and treatment plan with the patient. The patient was provided an opportunity to ask questions and all were answered. The patient agreed with the plan and demonstrated an understanding of the instructions.   The patient was advised to call back or seek an in-person evaluation if the symptoms worsen or if the condition fails to improve as anticipated.  I provided 30 minutes of non-face-to-face time during this encounter.  The patient was located at home.  The provider was located at [place].   Thayer Headings, PMHNP   Subjective:   Patient ID:  Leah Jones is a 58 y.o. (DOB [birth year]) female.  Chief Complaint: No chief complaint on file.   HPI Nastassja Witkop presents for follow-up of anxiety, mood disturbance, and ADD. Reports that she had car trouble when visiting daughter for her birthday in [place]. Reports that she and her husband remain separated- "I have my good days and my bad days and it gets easier every day." Reports that she initially had significant depression at start of separation and this has resolved. She reports that her anxiety is "on and off" and has been gradually improving. She reports that she was having panic attacks and severe anxiety after stressful interactions with her husband and this has become less frequent. She reports that she has not taken Buspar recently. She  reports that she is taking hydroxyzine most nights. Reports taking Propranolol a few times a week for anxiety with good response.  She reports taking Adderall during the week and occ on the weekend. Reports that Adderall is effective for hyperactivity and being able to complete tasks. She reports that concentration seems to be improving with decrease in ETOH use. She reports that her sleep quality also seems to have improved with decrease. She reports that she is sleeping about 6-7 hours a night on average. She reports that overall energy and motivation have been good. She reports that others have commented that her energy is good. She denies any impulsive or risky behavior. She reports that her appetite remains low and rarely feels hungry. She reports early satiety. Reports that she was experiencing decreased appetite prior to Adderall and denies any significant worsening in appetite with Adderall. Reports that she has lost some weight since start of separation. Denies SI.  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   She reports that Naltrexone has helped with ETOH use. She reports that she is drinking less- now every other day instead of daily and quantity is less now. She reports that cravings of ETOH have decreased. Denies misuse of any other substances besides ETOH.   Reports that she ran out of NAC about 1.5 months ago.   Past Psychiatric Medication Trials: Trileptal Buspar Ritalin Strattera Vyvanse Adderall  Adderall XR Hydroxyzine Propranolol Clonidine Prozac- Adverse effects Lithium- Adverse effects Gabapentin Propranolol- Effective for situational anxiety.   Review of Systems:  Review of Systems  Constitutional: Positive for unexpected weight change.  Cardiovascular: Negative for palpitations.  Genitourinary:        Reports menstrual changes  Musculoskeletal: Negative for gait problem.  Neurological: Negative for tremors.  Psychiatric/Behavioral:       Please refer to HPI   Reports that she had hyponatremia and this has resolved.   Medications: I have reviewed the patient's current medications.  Current Outpatient Medications  Medication Sig Dispense Refill  . amphetamine-dextroamphetamine (ADDERALL) 10 MG tablet Take 1/2-1 tab po BID 60 tablet 0  . Cyanocobalamin (VITAMIN B 12 PO) Take by mouth.    . estradiol (ESTRACE) 1 MG tablet     . gabapentin (NEURONTIN) 100 MG capsule 100 mg (1 cap) QHS for 3 days, then 200 mg (2 caps) for 3 days, then 300 mg QHS 90 capsule 0  . hydrOXYzine (ATARAX/VISTARIL) 25 MG tablet Take 1 tablet (25 mg total) by mouth every 6 (six) hours as needed for anxiety (insomnia). 90 tablet 2  . medroxyPROGESTERone (PROVERA) 2.5 MG tablet Take 2.5 mg by mouth daily.    . naltrexone (DEPADE) 50 MG tablet Take 2 tablets (100 mg total) by mouth daily. 180 tablet 0  . propranolol (INDERAL) 10 MG tablet Take 1-2 tabs po BID prn anxiety 120 tablet 2  . Acetylcysteine (NAC) 500 MG CAPS Take by mouth.    Derrill Memo ON 04/12/2019] amphetamine-dextroamphetamine (ADDERALL) 10 MG tablet Take 1 tablet (10 mg total) by mouth 2 (two) times a day. 60 tablet 0  . [START ON 03/15/2019] amphetamine-dextroamphetamine (ADDERALL) 10 MG tablet Take 1/2-1 tab po BID 60 tablet 0  . valACYclovir (VALTREX) 500 MG tablet Take 500 mg by mouth 2 (two) times daily.     No current facility-administered medications for this visit.     Medication Side Effects: None  Allergies:  Allergies  Allergen Reactions  . Seroquel [Quetiapine Fumarate] Other (See Comments)    Increase sedation on low dose and leg weakness    Past Medical History:  Diagnosis Date  . ADD (attention deficit disorder)   . Alcoholism ([place])    per record  . ANAL FISSURE, HX OF 04/15/2007   Annotation: occasional rectal bleeding  Qualifier: Diagnosis of  By: Larose Kells MD, Sylvarena deficiency   . Carpal tunnel syndrome   . Carpal tunnel syndrome 12/01/2009   Qualifier: Diagnosis of  By: Larose Kells MD, [place] of skin of foot 05/20/2014   Purplish discoloration top of L foot   . Eustachian tube dysfunction 01/25/2014  . Mallet deformity of fifth finger, left, acquired 06/19/2015  . Suicidal ideation     Family History  Problem Relation Age of Onset  . Diabetes Other   . Hypertension Other   . Colon cancer Mother   . Breast cancer Mother   . Celiac disease Mother   . Pancreatic cancer Paternal Grandfather   . Anxiety disorder Sister   . ADD / ADHD Sister   . Bipolar disorder Brother   . Pancreatic cancer Brother   . Bipolar disorder Son   . Lung cancer Paternal Grandmother     Social History   Socioeconomic History  . Marital status: Married    Spouse name: Not on file  . Number of children: 3  . Years of education: Not on  file  . Highest education level: Not on file  Occupational History  . Occupation: Buyer, retail: [employer]  Social Needs  . Financial resource strain: Not on file  . Food insecurity    Worry: Not on file    Inability: Not on file  . Transportation needs    Medical: Not on file    Non-medical: Not on file  Tobacco Use  . Smoking status: Current Some Day Smoker    Types: Cigarettes  . Smokeless tobacco: Never Used  Substance and Sexual Activity  . Alcohol use: Yes    Comment: Drinking 6 days a week. Drinks a bottle of wine, beer, or liquor   . Drug use: No  . Sexual activity: Not on file  Lifestyle  . Physical activity    Days per week: Not on file    Minutes per session: Not on file  . Stress: Not on file  Relationships  . Social Herbalist on phone: Not on file    Gets together: Not on file    Attends religious service: Not on file    Active member of club or organization: Not on file    Attends meetings of clubs or  organizations: Not on file    Relationship status: Not on file  . Intimate partner violence    Fear of current or ex partner: Not on file    Emotionally abused: Not on file    Physically abused: Not on file    Forced sexual activity: Not on file  Other Topics Concern  . 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   Please see review of systems for further details on the patient's review from today.   Objective:   Physical Exam:  There were no vitals taken for this visit.  Physical Exam Neurological:     Mental Status: She is alert and oriented to person, place, and time.     Cranial Nerves: No dysarthria.  Psychiatric:        Attention and Perception: Attention normal.        Mood and Affect: Mood is not depressed or elated.        Speech: Speech normal.        Behavior: Behavior is cooperative.        Thought Content: Thought content normal. Thought content is not paranoid or delusional. Thought content does not include homicidal or suicidal ideation. Thought content does not include homicidal or suicidal plan.        Cognition and Memory: Cognition and memory normal.        Judgment: Judgment normal.     Comments: Patient presents as less anxious compared to most recent exam.     Lab Review:     Component Value Date/Time   NA 132 (L) 11/30/2018 1005   NA 139 10/07/2018   K 5.0 11/30/2018 1005   CL 99 11/30/2018 1005   CO2 24 11/30/2018 1005   GLUCOSE 100 (H) 11/30/2018 1005   BUN 8 11/30/2018 1005   BUN 6 09/22/2018   CREATININE 0.73 11/30/2018 1005   CALCIUM 9.3 11/30/2018 1005   PROT 6.3 11/30/2018 1005   ALBUMIN 4.0 11/20/2018 0941   AST 29 11/30/2018 1005   ALT 24 11/30/2018 1005   ALKPHOS 51 11/20/2018 0941  BILITOT 0.6 11/30/2018 1005   GFRNONAA >60 03/10/2015 0015   GFRAA >60 03/10/2015 0015       Component  Value Date/Time   WBC 7.1 11/20/2018 0941   RBC 3.93 11/20/2018 0941   HGB 13.2 11/20/2018 0941   HCT 38.8 11/20/2018 0941   PLT 315.0 11/20/2018 0941   MCV 98.8 11/20/2018 0941   MCH 30.7 03/10/2015 0015   MCHC 34.1 11/20/2018 0941   RDW 13.2 11/20/2018 0941   LYMPHSABS 1.4 11/20/2018 0941   MONOABS 0.8 11/20/2018 0941   EOSABS 0.2 11/20/2018 0941   BASOSABS 0.1 11/20/2018 0941    No results found for: POCLITH, LITHIUM   No results found for: PHENYTOIN, PHENOBARB, VALPROATE, CBMZ   .res Assessment: Plan:   Will continue current medications since patient reports that medications are effective for her anxiety and concentration. Patient encouraged to continue to try to decrease alcohol intake since this will likely continue to have a positive impact on her mood, anxiety, and sleep. Patient to follow-up in 3 months or sooner if clinically indicated. Patient advised to contact office with any questions, adverse effects, or acute worsening in signs and symptoms.  Diagnoses and all orders for this visit:  Primary insomnia -     hydrOXYzine (ATARAX/VISTARIL) 25 MG tablet; Take 1 tablet (25 mg total) by mouth every 6 (six) hours as needed for anxiety (insomnia).  Generalized anxiety disorder -     propranolol (INDERAL) 10 MG tablet; Take 1-2 tabs po BID prn anxiety -     hydrOXYzine (ATARAX/VISTARIL) 25 MG tablet; Take 1 tablet (25 mg total) by mouth every 6 (six) hours as needed for anxiety (insomnia).  Attention deficit hyperactivity disorder (ADHD), combined type -     amphetamine-dextroamphetamine (ADDERALL) 10 MG tablet; Take 1/2-1 tab po BID -     amphetamine-dextroamphetamine (ADDERALL) 10 MG tablet; Take 1 tablet (10 mg total) by mouth 2 (two) times a day. -     amphetamine-dextroamphetamine (ADDERALL) 10 MG tablet; Take 1/2-1 tab po BID    Please see After Visit Summary for patient specific instructions.  No future appointments.  No orders of the defined types were  placed in this encounter.     -------------------------------

## 2019-02-18 ENCOUNTER — Other Ambulatory Visit: Payer: Self-pay | Admitting: Family Medicine

## 2019-03-08 ENCOUNTER — Other Ambulatory Visit: Payer: Self-pay | Admitting: Family Medicine

## 2019-04-22 ENCOUNTER — Other Ambulatory Visit: Payer: Self-pay

## 2019-04-22 ENCOUNTER — Ambulatory Visit (INDEPENDENT_AMBULATORY_CARE_PROVIDER_SITE_OTHER): Payer: 59 | Admitting: Family Medicine

## 2019-04-22 ENCOUNTER — Encounter: Payer: Self-pay | Admitting: Family Medicine

## 2019-04-22 VITALS — Wt 121.0 lb

## 2019-04-22 DIAGNOSIS — Z7141 Alcohol abuse counseling and surveillance of alcoholic: Secondary | ICD-10-CM

## 2019-04-22 DIAGNOSIS — F1020 Alcohol dependence, uncomplicated: Secondary | ICD-10-CM

## 2019-04-22 MED ORDER — NALTREXONE HCL 50 MG PO TABS: 100.0000 mg | ORAL_TABLET | Freq: Every day | ORAL | 0 refills | Status: DC

## 2019-04-22 MED ORDER — GABAPENTIN 100 MG PO CAPS: ORAL_CAPSULE | ORAL | 2 refills | Status: DC

## 2019-04-22 NOTE — Patient Instructions (Signed)
Alcohol Withdrawal Syndrome When a person who drinks a lot of alcohol stops drinking, he or she may have unpleasant and serious symptoms. These symptoms are called alcohol withdrawal syndrome. This condition may be mild or severe. It can be life-threatening. It can cause:  Shaking that you cannot control (tremor).  Sweating.  Headache.  Feeling fearful, upset, grouchy, or depressed.  Trouble sleeping (insomnia).  Nightmares.  Fast or uneven heartbeats (palpitations).  Alcohol cravings.  Feeling sick to your stomach (nausea).  Throwing up (vomiting).  Being bothered by light and sounds.  Confusion.  Trouble thinking clearly.  Not being hungry (loss of appetite).  Big changes in mood (mood swings). If you have all of the following symptoms at the same time, get help right away:  High blood pressure.  Fast heartbeat.  Trouble breathing.  Seizures.  Seeing, hearing, feeling, smelling, or tasting things that are not there (hallucinations). These symptoms are known as delirium tremens (DTs). They must be treated at the hospital right away. Follow these instructions at home:   Take over-the-counter and prescription medicines only as told by your doctor. This includes vitamins.  Do not drink alcohol.  Do not drive until your doctor says that this is safe for you.  Have someone stay with you or be available in case you need help. This should be someone you trust. This person can help you with your symptoms. He or she can also help you to not drink.  Drink enough fluid to keep your pee (urine) pale yellow.  Think about joining a support group or a treatment program to help you stop drinking.  Keep all follow-up visits as told by your doctor. This is important. Contact a doctor if:  Your symptoms get worse.  You cannot eat or drink without throwing up.  You have a hard time not drinking alcohol.  You cannot stop drinking alcohol. Get help right away if:   You have fast or uneven heartbeats.  You have chest pain.  You have trouble breathing.  You have a seizure for the first time.  You see, hear, feel, smell, or taste something that is not there.  You get very confused. Summary  When a person who drinks a lot of alcohol stops drinking, he or she may have serious symptoms. This is called alcohol withdrawal syndrome.  Delirium tremens (DTs) is a group of life-threatening symptoms. You should get help right away if you have these symptoms.  Think about joining an alcohol support group or a treatment program. This information is not intended to replace advice given to you by your health care provider. Make sure you discuss any questions you have with your health care provider. Document Released: 12/25/2007 Document Revised: 06/20/2017 Document Reviewed: 03/14/2017 Elsevier Patient Education  2020 Elsevier Inc.  

## 2019-04-22 NOTE — Progress Notes (Signed)
VIRTUAL VISIT VIA VIDEO  I connected with [name] on 04/22/19 at  8:30 AM EDT by a video enabled telemedicine application and verified that I am speaking with the correct person using two identifiers. Location patient: Home Location provider: [facility], Office Persons participating in the virtual visit: Patient, Dr. Raoul Pitch and Jerilynn Mages. Denning, CMA  I discussed the limitations of evaluation and management by telemedicine and the availability of in person appointments. The patient expressed understanding and agreed to proceed.   SUBJECTIVE Chief Complaint  Patient presents with  . Alcohol Problem    medication is working for her. she still has urges. she has been drinking more. she has drank 6 out of 7 days    HPI: Leah Jones is a 58 y.o. year old female present today to follow up on  alcohol dependency and anxiety.  Patient reports "today is the day.  "She has decided that she is not going to have any alcoholic beverages after today.  She states she has come to realize this is the main reason for her separation.  She reports when she does see her husband, she does not drink on those days.  They have discussed her alcohol use and she feels she is never been to be able to get back with her husband if she does not stop using alcohol.  She reports she has drank 6 out of the last 7 days.  When she has not drink for a few days in a row she does not report having any type of withdrawal symptoms.  She has not gone to any of the AA meetings as of yet secondary to not desiring to attend meetings by Zoom.  She reports continued use of the naltrexone and she has run out of the gabapentin. Prior note:  Patient reports the Seroquel even at 75 mg dose caused her to feel very drowsy and her legs weak.  She tried it for approximately 6 nights and could not tolerate the medication.  She is taking the naltrexone 50 mg daily.  She feels this is working some days to help curb her appetite for alcohol and  on other days it is not enough.  She reports she has cut back on drinking since her last appointment.  She has gone 2 to 3 days without drinking which she has not done in a very long time.  She had a drink last night when her realtor came over she drank a bottle wine herself and shared a bottle with her realtor.  She went to the beach last week and reports she did have some alcohol, but not as much as she normally would.  Typically she would had drinks all through the day and evening when at the beach.  She reports she only had a drink of alcohol with her evening meals.  She reports after she drinks she does get mad at herself.  And she would like to continue to cut back or quit.  Prior note 12/09/2018:  Patient reports she has used alcohol as a crutch for a long time.  She is recently separated from her husband secondary to her drinking.  Since being separated she has increased her drinking to cope with the separation.  She understands this is a vicious cycle for her.  She currently is drinking a bottle of wine +2-3 beers (or a vodka drink) daily.  She is currently under counseling for her anxiety and panic [facility], and was prescribed BuSpar, Vistaril, Trileptal.  She reports not taking any of these medications for some time, and the Trileptal she has not taken for the past week.  She did not feel the Trileptal was helpful for her.  He has great difficulty sleeping at night.  Her last drink was last night.  In discussing group therapy, patient states that she is "not in any AA person."She does not feel that she would be able to stand up in front of a group and discuss her issues.  She also is not interested in inpatient therapy.  She is interested in seeking care from a different therapist for her anxiety and her separation.  She feels that she has better coping mechanisms for those issues, then she will not fall back on alcohol as a crutch.  ROS: See pertinent positives and negatives per HPI.  Patient  Active Problem List   Diagnosis Date Noted  . Eye irritation 11/17/2018  . Diarrhea 11/17/2018  . Elevated liver enzymes 11/17/2018  . Alcohol dependence, daily use ([place]) 11/17/2018  . Adjustment disorder with mixed anxiety and depressed mood 03/12/2015  . Panic attack as reaction to stress 05/20/2014  . ADD (attention deficit disorder) 01/17/2012  . B12 DEFICIENCY 04/15/2007    Social History   Tobacco Use  . Smoking status: Current Some Day Smoker    Types: Cigarettes  . Smokeless tobacco: Never Used  Substance Use Topics  . Alcohol use: Yes    Comment: Drinking 6 days a week. Drinks a bottle of wine, beer, or liquor     Current Outpatient Medications:  .  amphetamine-dextroamphetamine (ADDERALL) 10 MG tablet, Take 1/2-1 tab po BID, Disp: 60 tablet, Rfl: 0 .  amphetamine-dextroamphetamine (ADDERALL) 10 MG tablet, Take 1 tablet (10 mg total) by mouth 2 (two) times a day., Disp: 60 tablet, Rfl: 0 .  clindamycin (CLEOCIN) 300 MG capsule, Take 300 mg by mouth 3 (three) times daily., Disp: , Rfl:  .  Cyanocobalamin (VITAMIN B 12 PO), Take by mouth., Disp: , Rfl:  .  estradiol (ESTRACE) 1 MG tablet, , Disp: , Rfl:  .  medroxyPROGESTERone (PROVERA) 2.5 MG tablet, Take 2.5 mg by mouth daily., Disp: , Rfl:  .  naltrexone (DEPADE) 50 MG tablet, Take 2 tablets (100 mg total) by mouth daily., Disp: 180 tablet, Rfl: 0 .  propranolol (INDERAL) 10 MG tablet, Take 1-2 tabs po BID prn anxiety, Disp: 120 tablet, Rfl: 2 .  valACYclovir (VALTREX) 500 MG tablet, Take 500 mg by mouth 2 (two) times daily., Disp: , Rfl:  .  Acetylcysteine (NAC) 500 MG CAPS, Take by mouth. , Disp: , Rfl:  .  amphetamine-dextroamphetamine (ADDERALL) 10 MG tablet, Take 1/2-1 tab po BID (Patient not taking: Reported on 04/22/2019), Disp: 60 tablet, Rfl: 0 .  gabapentin (NEURONTIN) 100 MG capsule, 100 mg (1 cap) QHS for 3 days, then 200 mg (2 caps) for 3 days, then 300 mg QHS (Patient not taking: Reported on 04/22/2019), Disp:  90 capsule, Rfl: 0  Allergies  Allergen Reactions  . Seroquel [Quetiapine Fumarate] Other (See Comments)    Increase sedation on low dose and leg weakness    OBJECTIVE: Wt 121 lb (54.9 kg)   LMP 04/18/2019 (Approximate)   BMI 19.53 kg/m  Gen: Afebrile. No acute distress.  HENT: AT. [place].  Eyes:Pupils Equal Round Reactive to light, Extraocular movements intact,  Conjunctiva without redness, discharge or icterus. Chest: no cough or shortness of breath Neuro: Normal gait.  Alert. Oriented x3 Psych: Normal affect, dress and  demeanor. Normal speech. Normal thought content and judgment.   Depression screen [facility] 2/9 04/22/2019 04/22/2019 11/17/2018  Decreased Interest 0 0 0  Down, Depressed, Hopeless 1 1 1   PHQ - 2 Score 1 1 1   Altered sleeping 2 - 0  Tired, decreased energy 1 - 1  Change in appetite 2 - 3  Feeling bad or failure about yourself  1 - 0  Trouble concentrating 2 - 0  Moving slowly or fidgety/restless 0 - 0  Suicidal thoughts 0 - 0  PHQ-9 Score 9 - 5  Difficult doing work/chores Somewhat difficult - Not difficult at all   GAD 7 : Generalized Anxiety Score 04/22/2019 11/17/2018  Nervous, Anxious, on Edge 2 1  Control/stop worrying 1 1  Worry too much - different things 0 1  Trouble relaxing 1 0  Restless 2 3  Easily annoyed or irritable 1 2  Afraid - awful might happen 0 0  Total GAD 7 Score 7 8  Anxiety Difficulty Somewhat difficult Not difficult at all    ASSESSMENT AND PLAN: Sola Farquhar is a 58 y.o. female present for  Alcohol abuse counseling and surveillance/Alcohol dependence, daily use ([facility]) -Continue naltrexone at 50-100 mg daily.  Patient was encouraged to take 50 mg a day and could take 100 mg a day on more tempting days. -Restart gabapentin 100/100/200-300 mg nightly -Discontinued Seroquel>> made too sleepy -Patient declined a group therapy, but was provided with the [employer]. -Patient declined inpatient cessation and  therapy.  She was educated on need for emergent care surrounding alcohol withdrawal syndrome. - She was provided with outpatient resources for alcohol cessation today [facility], [facility], [facility].  - f/u 3 mos, sooner if needed.    > 25 minutes spent with patient, >50% of time spent face to face     Howard Pouch, DO 04/22/2019

## 2019-04-26 ENCOUNTER — Telehealth (HOSPITAL_COMMUNITY): Payer: Self-pay | Admitting: Licensed Clinical Social Worker

## 2019-04-26 NOTE — Telephone Encounter (Signed)
Return phone call with info on [facility]. Left contact information and request return contact with more questions

## 2019-04-29 ENCOUNTER — Telehealth (HOSPITAL_COMMUNITY): Payer: Self-pay | Admitting: Licensed Clinical Social Worker

## 2019-04-29 NOTE — Telephone Encounter (Signed)
Clinician has made multiple attempts to reach client. Client has left voicemails. Clinician left information about CDIOP program on previous messages. Clinician provided number for Case Manager Dellia Nims for additional contact options.

## 2019-04-30 ENCOUNTER — Telehealth (HOSPITAL_COMMUNITY): Payer: Self-pay | Admitting: Licensed Clinical Social Worker

## 2019-05-07 ENCOUNTER — Other Ambulatory Visit: Payer: Self-pay

## 2019-05-07 ENCOUNTER — Ambulatory Visit (HOSPITAL_COMMUNITY): Payer: 59 | Admitting: Licensed Clinical Social Worker

## 2019-05-07 DIAGNOSIS — F1020 Alcohol dependence, uncomplicated: Secondary | ICD-10-CM

## 2019-05-09 DIAGNOSIS — F1020 Alcohol dependence, uncomplicated: Secondary | ICD-10-CM | POA: Insufficient documentation

## 2019-05-10 NOTE — Progress Notes (Signed)
   THERAPIST PROGRESS NOTE  Session Time: 11am-12pm  Participation Level: Active  Behavioral Response: CasualAlertAnxious  Type of Therapy: Assertive Engagement  Treatment Goals addressed: Diagnosis: alcohol use  Interventions: Motivational Interviewing  Summary: Leah Jones is a 58 y.o. female who presents with alcohol use disorder for an assessment and engagement in [place]. Client reports on average drinking more than a bottle of wine daily in addition to several mixed drinks. Client has not gone more than 3 days without drinking and denies withdrawal symptoms. Client reports family has commented on her drinking and requested she attend residential treatment. Client reports increasing alcohol use since separation from husband but was increasing prior to that which added to reasons for separation. Client shared stressors and goals for treatment. Client is receptive to psycho-educational information on the effects of alcohol use and reasons for detox vs CDIOP. Client is agreeable to completing detox prior to assessment and engagement in [place] but reports she is unable to go today as she has employment obligations upcoming. Client denies SI/HI/psychosis. Reports last drink was 1 beer at 3am.   Suicidal/Homicidal: Nowithout intent/plan  Therapist Response: Clinician met with client initially to complete CCA for admission to [facility]. Clinician screened client and determined client was not appropriate for CDIOP at this time and was in need of detox due to the amount and frequency of alcohol use at this time. Clinician answered questions for client related to detox and CDIOP process. Clinician provided psycho-educational information on levels of care discussed by her family and current recommendations. Client was provided with some of the rules for program including engagement in community meetings, and discontinuing all controlled substances which would include her adderall. This could be discussed with  provider following detox. Clinician anctively listened to client, providing summarizing and reflecting statements. Clinician provided contact information for clinician and case manager for client's discharge planner at detox.  Plan: Return again in following detox for assessment and CDIOP referral  Diagnosis: Axis I: Alcohol Abuse       Olegario Messier, LCSW 05/07/2019

## 2019-05-11 ENCOUNTER — Ambulatory Visit (HOSPITAL_COMMUNITY): Payer: 59 | Admitting: Licensed Clinical Social Worker

## 2019-05-14 ENCOUNTER — Ambulatory Visit (INDEPENDENT_AMBULATORY_CARE_PROVIDER_SITE_OTHER): Payer: 59 | Admitting: Licensed Clinical Social Worker

## 2019-05-14 ENCOUNTER — Other Ambulatory Visit (HOSPITAL_COMMUNITY): Payer: 59

## 2019-05-14 ENCOUNTER — Other Ambulatory Visit: Payer: Self-pay

## 2019-05-14 DIAGNOSIS — F988 Other specified behavioral and emotional disorders with onset usually occurring in childhood and adolescence: Secondary | ICD-10-CM

## 2019-05-14 DIAGNOSIS — F39 Unspecified mood [affective] disorder: Secondary | ICD-10-CM

## 2019-05-14 DIAGNOSIS — Z639 Problem related to primary support group, unspecified: Secondary | ICD-10-CM

## 2019-05-14 DIAGNOSIS — F1020 Alcohol dependence, uncomplicated: Secondary | ICD-10-CM | POA: Diagnosis not present

## 2019-05-14 MED ORDER — FOLIC ACID 1 MG PO TABS
1.00 | ORAL_TABLET | ORAL | Status: DC
Start: 2019-05-13 — End: 2019-05-14

## 2019-05-14 MED ORDER — GABAPENTIN 100 MG PO CAPS
100.00 | ORAL_CAPSULE | ORAL | Status: DC
Start: 2019-05-12 — End: 2019-05-14

## 2019-05-14 MED ORDER — GENERIC EXTERNAL MEDICATION
500.00 | Status: DC
Start: ? — End: 2019-05-14

## 2019-05-14 MED ORDER — DEXTROMETHORPHAN-GUAIFENESIN 10-100 MG/5ML PO SYRP
10.00 | ORAL_SOLUTION | ORAL | Status: DC
Start: ? — End: 2019-05-14

## 2019-05-14 MED ORDER — IBUPROFEN 400 MG PO TABS
400.00 | ORAL_TABLET | ORAL | Status: DC
Start: ? — End: 2019-05-14

## 2019-05-14 MED ORDER — NICOTINE 21 MG/24HR TD PT24
1.00 | MEDICATED_PATCH | TRANSDERMAL | Status: DC
Start: 2019-05-13 — End: 2019-05-14

## 2019-05-14 MED ORDER — DOCUSATE SODIUM 100 MG PO CAPS
100.00 | ORAL_CAPSULE | ORAL | Status: DC
Start: ? — End: 2019-05-14

## 2019-05-14 MED ORDER — BENZOCAINE-MENTHOL 6-10 MG MT LOZG
1.00 | LOZENGE | OROMUCOSAL | Status: DC
Start: ? — End: 2019-05-14

## 2019-05-14 MED ORDER — LORAZEPAM 1 MG PO TABS
2.00 | ORAL_TABLET | ORAL | Status: DC
Start: ? — End: 2019-05-14

## 2019-05-14 MED ORDER — BISACODYL 5 MG PO TBEC
10.00 | DELAYED_RELEASE_TABLET | ORAL | Status: DC
Start: ? — End: 2019-05-14

## 2019-05-14 MED ORDER — ACETAMINOPHEN 500 MG PO TABS
1000.00 | ORAL_TABLET | ORAL | Status: DC
Start: ? — End: 2019-05-14

## 2019-05-14 MED ORDER — POLYETHYLENE GLYCOL 3350 17 G PO PACK
17.00 | PACK | ORAL | Status: DC
Start: ? — End: 2019-05-14

## 2019-05-14 MED ORDER — LORAZEPAM 2 MG/ML IJ SOLN
2.00 | INTRAMUSCULAR | Status: DC
Start: ? — End: 2019-05-14

## 2019-05-14 MED ORDER — QUINTABS PO TABS
1.00 | ORAL_TABLET | ORAL | Status: DC
Start: 2019-05-13 — End: 2019-05-14

## 2019-05-14 MED ORDER — METHYLPHENIDATE HCL 5 MG PO TABS
10.00 | ORAL_TABLET | ORAL | Status: DC
Start: 2019-05-13 — End: 2019-05-14

## 2019-05-14 MED ORDER — DIPHENHYDRAMINE HCL 50 MG/ML IJ SOLN
50.00 | INTRAMUSCULAR | Status: DC
Start: ? — End: 2019-05-14

## 2019-05-14 MED ORDER — HYDROXYZINE HCL 25 MG PO TABS
25.00 | ORAL_TABLET | ORAL | Status: DC
Start: ? — End: 2019-05-14

## 2019-05-14 MED ORDER — MELATONIN 3 MG PO TABS
3.00 | ORAL_TABLET | ORAL | Status: DC
Start: ? — End: 2019-05-14

## 2019-05-14 MED ORDER — LOPERAMIDE HCL 2 MG PO CAPS
2.00 | ORAL_CAPSULE | ORAL | Status: DC
Start: ? — End: 2019-05-14

## 2019-05-14 MED ORDER — TRAZODONE HCL 50 MG PO TABS
50.00 | ORAL_TABLET | ORAL | Status: DC
Start: ? — End: 2019-05-14

## 2019-05-14 MED ORDER — CLONIDINE HCL 0.1 MG PO TABS
0.10 | ORAL_TABLET | ORAL | Status: DC
Start: ? — End: 2019-05-14

## 2019-05-14 MED ORDER — ESTRADIOL 1 MG PO TABS
1.00 | ORAL_TABLET | ORAL | Status: DC
Start: 2019-05-13 — End: 2019-05-14

## 2019-05-14 MED ORDER — GENERIC EXTERNAL MEDICATION
30.00 | Status: DC
Start: ? — End: 2019-05-14

## 2019-05-14 MED ORDER — CETIRIZINE HCL 10 MG PO TABS
10.00 | ORAL_TABLET | ORAL | Status: DC
Start: ? — End: 2019-05-14

## 2019-05-14 MED ORDER — HALOPERIDOL 5 MG PO TABS
5.00 | ORAL_TABLET | ORAL | Status: DC
Start: ? — End: 2019-05-14

## 2019-05-14 MED ORDER — DIPHENHYDRAMINE HCL 25 MG PO CAPS
50.00 | ORAL_CAPSULE | ORAL | Status: DC
Start: ? — End: 2019-05-14

## 2019-05-14 MED ORDER — NALTREXONE HCL 50 MG PO TABS
100.00 | ORAL_TABLET | ORAL | Status: DC
Start: 2019-05-13 — End: 2019-05-14

## 2019-05-14 MED ORDER — GENERIC EXTERNAL MEDICATION
100.00 | Status: DC
Start: 2019-05-13 — End: 2019-05-14

## 2019-05-14 MED ORDER — SALINE NASAL SPRAY 0.65 % NA SOLN
1.00 | NASAL | Status: DC
Start: ? — End: 2019-05-14

## 2019-05-14 MED ORDER — GENERIC EXTERNAL MEDICATION
5.00 | Status: DC
Start: ? — End: 2019-05-14

## 2019-05-14 MED ORDER — PROPRANOLOL HCL 10 MG PO TABS
20.00 | ORAL_TABLET | ORAL | Status: DC
Start: ? — End: 2019-05-14

## 2019-05-14 MED ORDER — NICOTINE POLACRILEX 2 MG MT GUM
2.00 | CHEWING_GUM | OROMUCOSAL | Status: DC
Start: ? — End: 2019-05-14

## 2019-05-14 MED ORDER — ONDANSETRON 8 MG PO TBDP
8.00 | ORAL_TABLET | ORAL | Status: DC
Start: ? — End: 2019-05-14

## 2019-05-14 MED ORDER — GENERIC EXTERNAL MEDICATION
Status: DC
Start: ? — End: 2019-05-14

## 2019-05-14 MED ORDER — HYDROXYZINE HCL 25 MG PO TABS
50.00 | ORAL_TABLET | ORAL | Status: DC
Start: ? — End: 2019-05-14

## 2019-05-14 NOTE — Progress Notes (Deleted)
Sunday afternoon - wed @ noon "I think detox is a joke and they did too much on the medical side" ativan taper.  'loopy' evening 4 pack little wine, drank 2. 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 addictive personality' Denies traumatic events Reports family feels manipulative when  05/2018 (children 'full of themself') family canceled birthday dinner last minute per father request; felt dismissed by family 'showed myself at thanksgiving' (after drinking more when family did not show up as agreed) Arguments with husband and guns/hunting more important when being with client  26 years married, 1 year separated; 'I love him but he is all about himself' Married twice (1st daughters father) 2/3 hx of addiction (daughter prgenant, hx of opioid addiction stopped 6 months ago due to pregnancy) Youngest (living with clt) dx bipolar, unmeddictated, cannabis use, 'a perioid of time (insight, phenix) Oldest 'golden child son'- makes good money 'full of himself' 'most empathetic as a child but has become very judgmental, not invoveled with family...turning toward husband" limited contact with son; youngest son living at home judgemental; daughter supportive Younger motherhood: 'alcool going on for a long time, used for coping/ numbing'  rollercoster relationship (husband) 'he's black and white and i'm gray., hes very judegmental, clt had to handle addiction, resentment and drinking when taking care of the children.  'excellent childhood' divorced paents in 3rd grade 'we had the best mother (6 kids) dad supported household while mom stayed home. '2 sisters are my  best friends' [2 black sheep 1 brother-alcoholism, 1 brother-graduated from [employer] got into heroin-collects disobility for 'nutcase']. 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

## 2019-05-16 NOTE — Progress Notes (Signed)
Comprehensive Clinical Assessment (CCA) Note  05/14/2019 Leah Jones [id number]  Visit Diagnosis:      ICD-10-CM   1. Alcohol dependence, daily use ([place])  F10.20   2. Mood disorder ([place])  F39   3. Attention deficit disorder, unspecified hyperactivity presence  F98.8   4. Family dysfunction  Z63.9       CCA Part One  Part One has been completed on paper by the patient.  (See scanned document in Chart Review)  CCA Part Two A  Intake/Chief Complaint:  CCA Intake With Chief Complaint CCA Part Two Date: 05/14/19 Chief Complaint/Presenting Problem: Alcohol Use Patients Currently Reported Symptoms/Problems: alcohol use, mood swings, anxiety, family discord Collateral Involvement: [place] med records Individual's Preferences: CDIOP instead of residental tx center Type of Services Patient Feels Are Needed: Substance abuse treatment Initial Clinical Notes/Concerns: See Below  Client is a 58 year old separated, Caucasian female presenting as a referral from primary care following detox from alcohol. Client presented on 05/07/19 and was referred to [facility]. Client was treated at [facility] 10/18-10/21/2020. Client states "I think detox is a joke and they did too much on the medical side and the Ativan make me feel loopy." Following feeling loopy client drank 2 of 4 pack of 'mini wine.'  Client is currently separated from her husband of 28 years as of 07/14/2018, alcohol has added to the strain in relationship. Client has 3 children, 30(f), 28 (M), 20 (m). Client currently living with 58M son. Daughter has a history of opioid use, now sober 6 months and pregnant. Son(20) living at home with client, history of polysubstance abuse and treatment, current daily cannabis use and possibly untreated bipolar disorder. Client currently has strained relationship with children who have called her an alcoholic. Client states in November 2019 family disputes began and she felt her husband felt guns/hunting was more  important that their marriage. At thanksgiving client 'show myself' when drunk. Client reports increasing alcohol use over the past year however regular drinking began in high school.  Client has 5 siblings, 2 sisters and 3 brothers. Parents separated when client was young, overall reports positive childhood however she did get in trouble often and began drinking at a young age. Father living, alcoholic, mother decreased 10/28/1998. One brother died of alcohol related complications, one brother positive for ETOH and heroin use in addition to mental health concerns, one brother drinks nightly, one sister alcoholic. Client's first husband was an alcoholic, and current husband is frequent drinker. Client's paternal uncle died from [place] and aunt is alcoholic. Client's paternal grandfather is in recovery from alcohol, client notes nerving seeing him drink.   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  Client diagnosed with ADD as an adult and taking Adderall for about 3 years. Client was being seen at [facility] for therapy with diagnosis of adjustment disorder. In addition to excessive drinking client endorses symptoms of anxiety, changes in appetite, racing thoughts, changes in sleep, irritability, marital stress, poor concentration, and hyperactivity. Client endorses anxiety "ebbs and flows, somewhat manageable but there are times I may need a Xanax but I have an addictive personality' Client reports health issues including low B12 and elevated liver enzymes.  Substance Abuse: Alcohol: first use 7th grade with friend's intermittently Client  endorses excessive drinking in college and ongoing into first marriage. Client was sober through all  pregnancies which is longest period of sobriety separate of less than 1.5- 2 weeks. Client reports anytime she has thought about stopping drinking before 'social pressure' kept her from following through. Regular use staring age [age] on weekends, drinking 'in excess' 3x weekly Age 81 graduated, met first husband, became pregnant 5 months later. Client drinking daily for 10+ years. Last drink 05/13/19- couple beers + Gatorade with 2 shots of vodka, prior to that 05/12/19 2 mini wine bottles. Marijuana: First use 06/2018 'one hit' from son. Reports use 15 times in lifetime. Amphetamines, 80m BID prescription for 3 years. Benzodiazepines. Client reports taking 4-5 times with in the past year from friend's prescription.    Mental Health Symptoms Depression:  Depression: Change in energy/activity, Irritability, Difficulty Concentrating  Mania:  Mania: N/A  Anxiety:   Anxiety: Worrying, Sleep, Restlessness, Difficulty concentrating  Psychosis:  Psychosis: N/A  Trauma:  Trauma: N/A  Obsessions:  Obsessions: N/A  Compulsions:  Compulsions: N/A  Inattention:  Inattention: Disorganized, Fails to pay attention/makes careless mistakes  Hyperactivity/Impulsivity:  Hyperactivity/Impulsivity: Blurts out answers, Difficulty waiting turn, Fidgets with hands/feet  Oppositional/Defiant Behaviors:  Oppositional/Defiant Behaviors: Temper  Borderline Personality:  Emotional Irregularity: Mood lability, Intense/inappropriate anger, Transient, stress-related paranois/disociation('denies mood swings'; 'showing out when drinking, whatever i might be upset with, i may let the tounge do the lashin')  Other Mood/Personality Symptoms:  Other Mood/Personality Symtpoms: worried family will be overwatching   Mental Status Exam Appearance and self-care  Stature:  Stature: Average  Weight:  Weight: Average weight  Clothing:  Clothing: Casual  Grooming:  Grooming: Normal  Cosmetic use:  Cosmetic Use: Age appropriate   Posture/gait:  Posture/Gait: Normal  Motor activity:  Motor Activity: Not Remarkable  Sensorium  Attention:  Attention: Distractible  Concentration:  Concentration: Anxiety interferes, Scattered  Orientation:  Orientation: X5  Recall/memory:  Recall/Memory: Normal  Affect and Mood  Affect:  Affect: Appropriate  Mood:  Mood: Euthymic  Relating  Eye contact:  Eye Contact: Normal  Facial expression:  Facial Expression: Responsive  Attitude toward examiner:  Attitude Toward Examiner: Cooperative  Thought and Language  Speech flow: Speech Flow: Normal  Thought content:  Thought Content: Appropriate to mood and circumstances  Preoccupation:  Preoccupations: (none)  Hallucinations:     Organization:     ETransport plannerof Knowledge:  Fund of Knowledge: Average  Intelligence:     Abstraction:  Abstraction: Abstract  Judgement:  Judgement: Fair('probably goes up or down when your drinking')  Reality Testing:  Reality Testing: Realistic  Insight:  Insight: Fair  Decision Making:  Decision Making: Normal  Social Functioning  Social Maturity:  Social Maturity: Responsible  Social Judgement:  Social Judgement: Normal  Stress  Stressors:  Stressors: Family conflict, Transitions, Work  Coping Ability:  Coping Ability: Deficient supports, OEnglish as a second language teacherDeficits:     Supports:      Family and Psychosocial History: Family history Marital status: Separated Separated, when?: 06/2018 '3 days before christmas' What types of issues is patient dealing with in the relationship?: drinking causing arguments, Additional relationship information: divorced first marriage; current strain due client feeling husband is not prioiritizing their marriage causing increased drinking by client resulting in additional arguments Are you sexually active?: No What is your sexual orientation?: heterosexual Has your sexual activity been affected by drugs, alcohol, medication, or emotional stress?:  yes; separation Does patient have children?: Yes How many  children?: 3 How is patient's relationship with their children?: 'okay' with daughter, so who lives in home calling client alcoholic, older son has limited contact and client feelings he is judgemental'  Childhood History:  Childhood History By whom was/is the patient raised?: Both parents Additional childhood history information: parents separated. mom stay at home mom, father in [place] Description of patient's relationship with caregiver when they were a child: positive, lack of supervision Patient's description of current relationship with people who raised him/her: positive How were you disciplined when you got in trouble as a child/adolescent?: appropriate Does patient have siblings?: Yes Number of Siblings: 5 Description of patient's current relationship with siblings: positive; contact with sisters regularly, intermittent contact with 2 living brothers Did patient suffer any verbal/emotional/physical/sexual abuse as a child?: No Did patient suffer from severe childhood neglect?: No(client denies. 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 How long has patient been employed?: out of work 3 years Patient's job has been impacted by current illness: Yes Describe how patient's job has been impacted: planning drinking around [place] clients What is the longest time patient has a held a job?: 10 years 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 Did You Receive Any Psychiatric Treatment/Services While in the [employer]?: No Are There Guns or Other Weapons in [place]?: Yes Are These Weapons Safely Secured?: Yes(client reports she is not given access) Who Could Verify You Are Able To Have These Secured:: 'i have no access to them in the safe.Marland Kitcheni think that's probalby best'  Education: Education Last Grade Completed: 13 Did You Graduate From [employer]?: Yes Did You Attend College?: Yes What Type of College Degree Do you Have?: BS Did [place]?: No What Was Your Major?: communication Did You Have An Individualized Education Program (IIEP): No Did You Have Any Difficulty At School?: No  Religion: Religion/Spirituality Are You A Religious Person?: Yes What is Your Religious Affiliation?: Christian How Might This Affect Treatment?: christian; Engineer, civil (consulting): Leisure / Recreation Leisure and Hobbies: prior to covid: see live music, walking, bible study  Exercise/Diet: Exercise/Diet Do You Exercise?: No Have You Gained or Lost A Significant Amount of Weight in the Past Six Months?: (less working out when [employer]) Do You Follow a Special Diet?: No Do You Have Any Trouble Sleeping?: Yes(wake up for couple hours in middle of the night; wake up tired) Explanation of Sleeping Difficulties: previously wake up and have a drinkin middle of night  CCA Part Two C  Alcohol/Drug Use: Alcohol / Drug Use Pain Medications: none Prescriptions: adderall Over the Counter: none History of alcohol / drug use?: Yes Longest period of sobriety (when/how long): 9 months durring pregnancy x 3, 1.5-2weeks otherwise Negative Consequences of Use: Personal relationships Withdrawal Symptoms: Tremors, Nausea / Vomiting, Sweats Substance #1 Name of Substance 1: alcohol 1 - Age of First Use: 15 1 - Amount (size/oz): current: 1 bottle of win + 2 beers + 2 mixed drinks 1 -  Frequency: nightly 1 - Duration: daily 10+  years, total 30 years 1 - Last Use / Amount: 05/13/19 2 shots of vodka, mixed drink Substance #2 Name of Substance 2: xanax 2 - Age of First Use: 43 2 - Amount (size/oz): .5 mg 2 - Frequency: monthly 2 - Duration: 1 year 2 - Last Use / Amount: January 2020/ .77m      CCA Part Three  ASAM's:  Six Dimensions of Multidimensional Assessment  Dimension 1:  Acute Intoxication and/or Withdrawal Potential:  Dimension 1:  Comments: ongoing daily use after detox  Dimension 2:  Biomedical Conditions and Complications:  Dimension 2:  Comments: elevated liver enzymes  Dimension 3:  Emotional, Behavioral, or Cognitive Conditions and Complications:  Dimension 3:  Comments: symptoms of anxiety, sadness, anger  Dimension 4:  Readiness to Change:  Dimension 4:  Comments: desire to change, unsure about being open to level of cares  Dimension 5:  Relapse, Continued use, or Continued Problem Potential:  Dimension 5:  Comments: alcohol use the day of completion of detox on top of ativan given at hospital  Dimension 6:  Recovery/Living Environment:  Dimension 6:  Recovery/Living Environment Comments: feels unsupported by family and pressured to drink by longtime friends; son cannabis use dailyin the home   Substance use Disorder (SUD) Substance Use Disorder (SUD)  Checklist Symptoms of Substance Use: Continued use despite having a persistent/recurrent physical/psychological problem caused/exacerbated by use, Continued use despite persistent or recurrent social, interpersonal problems, caused or exacerbated by use, Evidence of tolerance, Evidence of withdrawal (Comment), Large amounts of time spent to obtain, use or recover from the substance(s), Persistent desire or unsuccessful efforts to cut down or control use, Presence of craving or strong urge to use, Recurrent use that results in a fialure to fulfill major rule obligatinos (work, school, home), Repeated use in  physically hazardous situations, Social, occupational, recreational activities given up or reduced due to use, Substance(s) often taken in large amounts or over longer times than was intended  Social Function:  Social Functioning Social Maturity: Responsible Social Judgement: Normal  Stress:  Stress Stressors: Family conflict, Transitions, Work Coping Ability: Deficient supports, Overwhelmed Patient Takes Medications The Way The Doctor Instructed?: Yes Priority Risk: Moderate Risk  Risk Assessment- Self-Harm Potential: Risk Assessment For Self-Harm Potential Thoughts of Self-Harm: No current thoughts Method: No plan Availability of Means: No access/NA  Risk Assessment -Dangerous to Others Potential: Risk Assessment For Dangerous to Others Potential Method: No Plan  DSM5 Diagnoses: Patient Active Problem List   Diagnosis Date Noted  . Eye irritation 11/17/2018  . Diarrhea 11/17/2018  . Elevated liver enzymes 11/17/2018  . Alcohol dependence, daily use ([place] 11/17/2018  . Adjustment disorder with mixed anxiety and depressed mood 03/12/2015  . Panic attack as reaction to stress 05/20/2014  . ADD (attention deficit disorder) 01/17/2012  . B12 DEFICIENCY 04/15/2007    Patient Centered Plan: Patient is on the following Treatment Plan(s):  Impulse Control; Alcohol use disorder  Recommendations for Services/Supports/Treatments: Recommendations for Services/Supports/Treatments Recommendations For Services/Supports/Treatments: CD-IOP Intensive Chemical Dependency Program  Treatment Plan Summary: Client meets criteria for alcohol use disorder. Recommendations: CDIOP, client also offered referral to residential treatment due to lack of supportive recovery environment. Goals: Achieve and maintain sobriety 7/7 days per week for 52 weeks. Increase use of healthy coping skills to at lest 1 time per day at least 4 days per week.  Referrals to Alternative Service(s): Referred to  Alternative Service(s):   Place:   Date:   Time:    Referred to  Alternative Service(s):   Place:   Date:   Time:    Referred to Alternative Service(s):   Place:   Date:   Time:    Referred to Alternative Service(s):   Place:   Date:   Time:     Olegario Messier, LCSW, LCAS

## 2019-05-17 ENCOUNTER — Other Ambulatory Visit (HOSPITAL_COMMUNITY): Payer: 59

## 2019-05-17 ENCOUNTER — Other Ambulatory Visit: Payer: Self-pay

## 2019-05-17 ENCOUNTER — Other Ambulatory Visit (HOSPITAL_COMMUNITY): Payer: 59 | Attending: Medical | Admitting: Licensed Clinical Social Worker

## 2019-05-17 ENCOUNTER — Other Ambulatory Visit (HOSPITAL_COMMUNITY): Payer: Self-pay | Admitting: Medical

## 2019-05-17 ENCOUNTER — Encounter (HOSPITAL_COMMUNITY): Payer: Self-pay

## 2019-05-17 ENCOUNTER — Encounter (HOSPITAL_COMMUNITY): Payer: Self-pay | Admitting: Medical

## 2019-05-17 VITALS — BP 126/84 | HR 77 | Ht 66.0 in | Wt 130.0 lb

## 2019-05-17 DIAGNOSIS — Z793 Long term (current) use of hormonal contraceptives: Secondary | ICD-10-CM | POA: Insufficient documentation

## 2019-05-17 DIAGNOSIS — Z560 Unemployment, unspecified: Secondary | ICD-10-CM | POA: Diagnosis not present

## 2019-05-17 DIAGNOSIS — F17210 Nicotine dependence, cigarettes, uncomplicated: Secondary | ICD-10-CM | POA: Diagnosis not present

## 2019-05-17 DIAGNOSIS — Z811 Family history of alcohol abuse and dependence: Secondary | ICD-10-CM

## 2019-05-17 DIAGNOSIS — Z6372 Alcoholism and drug addiction in family: Secondary | ICD-10-CM

## 2019-05-17 DIAGNOSIS — Z79899 Other long term (current) drug therapy: Secondary | ICD-10-CM | POA: Diagnosis not present

## 2019-05-17 DIAGNOSIS — F19982 Other psychoactive substance use, unspecified with psychoactive substance-induced sleep disorder: Secondary | ICD-10-CM

## 2019-05-17 DIAGNOSIS — F1994 Other psychoactive substance use, unspecified with psychoactive substance-induced mood disorder: Secondary | ICD-10-CM

## 2019-05-17 DIAGNOSIS — G621 Alcoholic polyneuropathy: Secondary | ICD-10-CM

## 2019-05-17 DIAGNOSIS — F419 Anxiety disorder, unspecified: Secondary | ICD-10-CM | POA: Diagnosis not present

## 2019-05-17 DIAGNOSIS — F1020 Alcohol dependence, uncomplicated: Secondary | ICD-10-CM | POA: Diagnosis not present

## 2019-05-17 MED ORDER — BACLOFEN 10 MG PO TABS: 10.0000 mg | ORAL_TABLET | Freq: Three times a day (TID) | ORAL | 1 refills | Status: DC

## 2019-05-17 NOTE — Progress Notes (Addendum)
Psychiatric Initial Adult Assessment   Patient Identification: Leah Jones MRN:  [id number] Date of Evaluation:  05/17/2019 Referral Source:  Leah Jones MCMHC/Raj Hardie Lora MD [facility] PCP Ma Hillock DO Chief Complaint:   Chief Complaint    Establish Care; Alcohol Problem; Anxiety; Family Problem     Visit Diagnosis:    ICD-10-CM   1. Alcohol dependence, daily use ([employer])  F10.20   2. Dysfunctional family due to alcoholism  Z63.72   3. Substance induced mood disorder (HCC)  F19.94   4. 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 Family history of alcoholism  Z81.1    F,PGF;Son  6. Cigarette nicotine dependence without complication  [id number]    History of Present Illness: 58 y/o WF with family history of paternal alcoholism who sought help for concerns over her alcohol use 05/07/2019: Summary: Leah Jones is a 58 y.o. female who presents with alcohol use disorder for an assessment and engagement in [place]. Client reports on average drinking more than a bottle of wine daily in addition to several mixed drinks. Client has not gone more than 3 days without drinking and denies withdrawal symptoms. Client reports family has commented on her drinking and requested she attend residential treatment. Client reports increasing alcohol use since separation from husband but was increasing prior to that which added to reasons for separation. Client shared stressors and goals for treatment. Client is receptive to psycho-educational information on the effects of alcohol use and reasons for detox vs CDIOP. Client is agreeable to completing detox prior to assessment and engagement in [place] but reports she is unable to go today as she has employment obligations upcoming. Client denies SI/HI/psychosis. Reports last drink was 1 beer at 3am.   Patient underwent Detox from 10/18-10/21/2020 at [facility] discharge she says she felt such dysphoria from Ativan she  thought drinking alcohol would relieve the feeling.She returned to [place] 05/14/19 to complete Comprehensive Clinical Assessment and today is attending her 1st Group. She took her last drinks yesterday (See SA Chart).She prefers IOP to IP trteeatment.Family is urging IP.Today she says she is reconsidering because "there are too many triggers with my family". She says outcome depend on Insurance and facility.   Additonal Information; DSM V SUD Criteria Alcohol 10/11+ severe dependence  ASAM's:  Six Dimensions of Multidimensional Assessment Dimension 1:  Acute Intoxication and/or Withdrawal Potential:  Dimension 1:  Comments: ongoing daily use after detox  Dimension 2:  Biomedical Conditions and Complications:  Dimension 2:  Comments: elevated liver enzymes  Dimension 3:  Emotional, Behavioral, or Cognitive Conditions and Complications:  Dimension 3:  Comments: symptoms of anxiety, sadness, anger  Dimension 4:  Readiness to Change:  Dimension 4:  Comments: desire to change, unsure about being open to level of cares  Dimension 5:  Relapse, Continued use, or Continued Problem Potential:  Dimension 5:  Comments: alcohol use the day of completion of detox on top of ativan given at hospital  Dimension 6:  Recovery/Living Environment:  Dimension 6:  Recovery/Living Environment Comments: feels unsupported by family and pressured to drink by longtime friends; son cannabis use dailyin the home   AUDIT 24 Questions 9&10 +  PHQ 9 15 Somewhat difficult GAD 7 9 Somewhat difficult  Past Psychiatric History:  OP [facility]Counselor [facility] per HPI  Previous Psychotropic Medications: Yes   Substance Abuse History in the last 12 months:   Substance Abuse History in the last 12  months: Substance Age of 1st Use Last Use Amount Specific Type  Nicotine 58 yo Today    Alcohol 58 yo 05/16/19 1 Bottle wine +   Cannabis      Opiates      Cocaine      Methamphetamines Rx  Adderall  ?ADHD vs alcohol/mood related attention deficit    LSD      Ecstasy      Benzodiazepines  No Rx 1/20 .5mg  ?  Caffeine      Inhalants      Others:                         Substance use Disorder (SUD) Substance Use Disorder (SUD)  Checklist Symptoms of Substance Use: Continued use despite having a persistent/recurrent physical/psychological problem caused/exacerbated by use, Continued use despite persistent or recurrent social, interpersonal problems, caused or exacerbated by use, Evidence of tolerance, Evidence of withdrawal (Comment), Large amounts of time spent to obtain, use or recover from the substance(s), Persistent desire or unsuccessful efforts to cut down or control use, Presence of craving or strong urge to use, Recurrent use that results in a fialure to fulfill major rule obligatinos (work, school, home), Repeated use in physically hazardous situations, Social, occupational, recreational activities given up or reduced due to use, Substance(s) often taken in large amounts or over longer times than was intended   Past Medical History:  Past Medical History:  Diagnosis Date  . ADD (attention deficit disorder)    ? alcohol related  . Alcoholism ([place])    per record  . ANAL FISSURE, HX OF 04/15/2007   Annotation: occasional rectal bleeding Qualifier: Diagnosis of  By: Larose Kells MD, [place] deficiency   . Carpal tunnel syndrome   . Carpal tunnel syndrome 12/01/2009   Qualifier: Diagnosis of  By: Larose Kells MD, [place] of skin of foot 05/20/2014   Purplish discoloration top of L foot   . Eustachian tube dysfunction 01/25/2014  . Mallet deformity of fifth finger, left, acquired 06/19/2015  . Suicidal ideation     Past Surgical History:  Procedure Laterality Date  . COLPOSCOPY  2020   pt reported benign.   Marland Kitchen FOOT SURGERY Left    Morton's neuroma, Dr Paulla Dolly  . RHINOPLASTY    . TONSILLECTOMY     Family Psychiatric History:  Alcoholism PGF ,Father;Brothers    Sister diagnosed ADHD                                 Bipolar-  Brother and Son                                            Family History:  Family History  Problem Relation Age of Onset  . Diabetes Other   . Hypertension Other   . Colon cancer Mother   . Breast cancer Mother   . Celiac disease Mother   . Pancreatic cancer Paternal Grandfather   . Anxiety disorder Sister   . ADD / ADHD Sister   . Bipolar disorder Brother   . Pancreatic cancer Brother   . Bipolar disorder Son   . Alcoholism Father   . Lung cancer Paternal Grandmother    Social History:   Social History  Socioeconomic History  . Marital status: Married    Spouse name: Not on file  . Number of children: 3  . Years of education: [employer]  . Highest education level:   Occupational History  . 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 Financial resource strain: Not on file  . Food insecurity    Worry: Not on file    Inability: Not on file  . Transportation needs    Medical: Not on file    Non-medical: Not on file  Tobacco Use  . Smoking status: Current Some Day Smoker    Types: Cigarettes  . Smokeless tobacco: Never Used  Substance and Sexual Activity  . Alcohol use: Yes    Comment: Drinking 6 days a week. Drinks a bottle of wine, beer, or liquor   . Drug use: No  . Sexual activity: Not on file  Lifestyle  . Physical activity    Days per week: Not on file    Minutes per session: Not on file  . Stress: Stressors:  Stressors: Family conflict, Transitions, Work  Coping Ability:  Coping Ability: Deficient supports, Overwhelmed     Relationships  . Social Herbalist on phone: Not on file    Gets together: Not on file    Attends religious service: Not on file     Active member of club or organization: Not on file    Attends meetings of clubs or organizations: Not on file    Relationship status: Not on file  Other Topics Concern  . Not on file  Social History Narrative   G5 P3, 3 miscarriages   Additional Social History:  Allergies:   Allergies  Allergen Reactions  . Seroquel [Quetiapine Fumarate] Other (See Comments)    Increase sedation on low dose and leg weakness   Medications b complex vitamins capsule Take by mouth.  baclofen 10 MG tablet Commonly known as: LIORESAL Take 1 tablet (10 mg total) by mouth 3 (three) times daily.  clindamycin 300 MG capsule Commonly known as: CLEOCIN Take 300 mg by mouth 3 (three) times daily.  estradiol 1 MG tablet Commonly known as: ESTRACE   gabapentin 100 MG capsule Commonly known as: NEURONTIN 1 tab in the morning, 1 tab afternoon, 2-3 tabs QHS,  medroxyPROGESTERone 2.5 MG tablet Commonly known as: PROVERA Take 2.5 mg by mouth daily.  NAC 500 MG Caps Generic drug: Acetylcysteine Take by mouth.  naltrexone 50 MG tablet Commonly known as: DEPADE Take 2 tablets (100 mg total) by mouth daily.  propranolol 10 MG tablet Commonly known as: INDERAL Take 1-2 tabs po BID prn anxiety  valACYclovir 500 MG tablet Commonly known as: VALTREX Take 500 mg by mouth 2 (two) times daily.  VITAMIN B 12 PO Take by mouth    Metabolic Disorder Labs: No results found for: HGBA1C, MPG No results found for: PROLACTIN Lab Results  Component Value Date   CHOL 127 10/07/2018   TRIG 94 10/07/2018   HDL 65 10/07/2018   CHOLHDL 2 05/19/2014   VLDL 14.0 05/19/2014   LDLCALC 44 10/07/2018   LDLCALC 73 05/19/2014   Lab Results  Component Value Date   TSH 3.08 09/22/2018     Drug Allergies:  Allergies  Allergen Reactions  .  Seroquel [Quetiapine Fumarate] Other (See Comments)    Increase sedation on low dose and leg weakness  :  Psychiatric Specialty Exam: ROS CONSTITUTIONAL: NO Fever, Fatigue, Generalized  Weakness and Poor Appetite  EYES: Wears Glasses, NO Change in Vision, Blurred Vision and Other Eye Problems   EARS/NOSE/THROAT: NO Hearing Loss, Ear Drainage, Ringing in Ears, Ear Pain, Sinus Problems, Nose Bleeding and Painful Swallowing  HEART: NO Irregular Heartbeat, Palpitations and Chest Pain  LUNG:NO Wheezing, Shortness of Breath  ,Cough    STOMACH/BOWEL: NO Nausea, Vomiting, Diarrhea, Constipation, Change in Color Stool  and Abdominal Pain  + Heartburn, Reflux  GENITOURINARY: NO Pain with Urination, Frequency and Urgency  SKIN: NO Rash, Pruritus and Bruise Easily  MUSCLE/BONES: NO Back Pain and Joint Pain  NERVOUS SYSTEM:NO Seizure, Headaches, Dizziness, Vertigo, Weakness and Numbness  HORMONES/REPRODUCTIVE: NO Diabetes, Thyroid Problem ; Menarche +ERT  BLOOD/LYMPH SYSTEM: None  IMMUNE: None Psychiatric: Agitation: Yes Hallucination: No Depressed Mood: Yes Insomnia: Yes Hypersomnia: Negative Altered Concentration: Yes Feels Worthless: Yes Grandiose Ideas: No Belief In Special Powers: No New/Increased Substance Abuse: Yes Compulsions: Yes  Last menstrual period 04/18/2019.There is no height or weight on file to calculate BMI.  General Appearance: Well Groomed  Eye Contact:  Good  Speech:  Clear and Coherent  Volume:  Normal  Mood:  Anxious and Dysphoric  Affect:  Congruent  Thought Process:  Coherent and Descriptions of Associations: Intact  Orientation:  Full (Time, Place, and Person)  Thought Content:  WDL, Logical, Illogical, Delusions and Obsessions  Suicidal Thoughts:  No  Homicidal Thoughts:  No  Memory:  Blackouts  Judgement:  Impaired  Insight:  Lacking  Psychomotor Activity:  Restlessness  Concentration:  Concentration: intact for visit and Attention Span: intact for visit  Recall:  Negative  Fund of Knowledge:WDL  Language: WDL  Akathisia:  NA  Handed:  Right  AIMS (if indicated):  NA  Assets:  Desire for Improvement Financial  Resources/Insurance Housing Resilience Talents/Skills Transportation Vocational/Educational  ADL's:  Intact  Cognition: Impaired,  Moderate alcohol use related-acute  Sleep:  Poor    Screenings: AIMS     Admission (Discharged) from 03/11/2015 in [facility] [facility]  AIMS Total Score  0    AUDIT     Admission (Discharged) from 03/11/2015 in [facility] [facility]  Alcohol Use Disorder Identification Test Final Score (AUDIT)  4    GAD-7     Office Visit from 04/22/2019 in [place] Office Visit from 11/17/2018 in [place]  Total GAD-7 Score  7  8    PHQ2-9     Office Visit from 04/22/2019 in [place] Office Visit from 11/17/2018 in [place]  PHQ-2 Total Score  1  1  PHQ-9 Total Score  9  5      Assessment Alcohol Dependence severe   and Plan: Treatment Plan/Recommendations:  Plan of Care: [facility] CD IOP see Counselor's indiviualized plan  Laboratory:  UDS per protocol  Psychotherapy: IOP Individual;group;family  Medications: See List MAT Baclofen  Routine PRN Medications:  Negative  Consultations: NA  Safety Concerns:  Alcohol use  Other:  NA    Darlyne Russian, PA-C 05/17/2019 3:30 pm

## 2019-05-18 NOTE — Progress Notes (Signed)
    Daily Group Progress Note  Program: CD-IOP   Group Time: 1pm-2:30pm  Participation Level: Active  Behavioral Response: Appropriate and Sharing  Type of Therapy: Process Group  Topic: Clinician checked in with group members, assessing for SI/HI/psychosis and overall level of functioning. Clinician and group members processed events of the weekend, including highlights and moments of struggle. Clinician and group members processed related thoughts and feelings and coping skills used to address. Clinician and group members read and processed Daily Reflection and relation to current stage of recovery.   Group Time: 2:30pm-4pm  Participation Level: Active  Behavioral Response: Appropriate  Type of Therapy: Psycho-education Group  Topic: Clinician provided guided relaxation skill in session. Clinician presented psycho-educational information on 'Addictive Personalities' using supporting material from The Substance Abuse and Recovery Workbook. Clinician and group members discussed personality traits relating to self-esteem, relationship dynamics, life skills, and excitement. Clinician and group members discussed changes in risk taking in recovery vs active addiction. Clinician inquired a self-care activity planned before next group.   Summary: Client shared reason for engaging in substance abuse treatment and processed ambivalence of change with the group. Client is receptive to feedback and support from group members. Client did identify struggle with identifying an accomplishment due to current family conflict. Client shared how family discord is not supportive of her recovery, which is why she is considering residential treatment as an alternative to [place]. Client is receptive to information for PA-C, see note for additional details. Client sobriety date of 05/17/19, reports drinking multiple times since completing detox last week.    Family Program: Family present? No   Name of  family member(s): N/A  UDS collected: Yes Results: pending  AA/NA attended?: No; client first group. Client is open to celebrate recovery   Sponsor?: No   Oneita Kras A Tocarra Gassen, LCSW, LCAS

## 2019-05-19 NOTE — Addendum Note (Signed)
Addended by: Dara Hoyer on: 05/19/2019 05:44 PM   Modules accepted: Orders

## 2019-05-20 ENCOUNTER — Telehealth (HOSPITAL_COMMUNITY): Payer: Self-pay | Admitting: Licensed Clinical Social Worker

## 2019-05-21 LAB — LIPID PANEL
Cholesterol: 123 (ref 0–200)
HDL: 64 (ref 35–70)
LDL Cholesterol: 41
Triglycerides: 93 (ref 40–160)

## 2019-05-21 LAB — HEPATIC FUNCTION PANEL
ALT: 18 (ref 7–35)
AST: 23 (ref 13–35)

## 2019-05-21 LAB — VITAMIN B12: Vitamin B-12: 729

## 2019-05-21 LAB — CBC: RBC: 3.78 — AB (ref 3.87–5.11)

## 2019-05-21 LAB — BASIC METABOLIC PANEL
BUN: 8 (ref 4–21)
Creatinine: 0.8 (ref 0.5–1.1)

## 2019-05-21 LAB — TSH: TSH: 2.96 (ref 0.41–5.90)

## 2019-05-21 LAB — HEMOGLOBIN A1C: Hemoglobin A1C: 4.5

## 2019-05-25 LAB — CBC AND DIFFERENTIAL
HCT: 37 (ref 36–46)
Hemoglobin: 12.2 (ref 12.0–16.0)
Platelets: 337 (ref 150–399)

## 2019-05-25 LAB — HM HEPATITIS C SCREENING LAB: HM Hepatitis Screen: NEGATIVE

## 2019-07-15 ENCOUNTER — Encounter: Payer: Self-pay | Admitting: Family Medicine

## 2019-08-23 ENCOUNTER — Other Ambulatory Visit: Payer: Self-pay

## 2019-08-23 ENCOUNTER — Ambulatory Visit: Payer: 59 | Attending: Internal Medicine

## 2019-08-23 DIAGNOSIS — Z20822 Contact with and (suspected) exposure to covid-19: Secondary | ICD-10-CM

## 2019-08-23 DIAGNOSIS — U071 COVID-19: Secondary | ICD-10-CM

## 2019-08-23 HISTORY — DX: COVID-19: U07.1

## 2019-08-24 LAB — NOVEL CORONAVIRUS, NAA: SARS-CoV-2, NAA: DETECTED — AB

## 2019-08-25 NOTE — Progress Notes (Signed)
Your test for COVID-19 was positive ("detected"), meaning that you were infected with the novel coronavirus and could give the germ to others.    Please continue isolation at home, for at least 10 days since the start of your fever/cough/breathlessness and until you have had 24 hours without fever (without taking a fever reducer) and with any cough/breathlessness improving. Use over-the-counter medications for symptoms.  If you have had no symptoms, but were exposed to someone who was positive for COVID-19, you will need to quarantine and self-isolate for 14 days from the date of exposure.    Please continue good preventive care measures, including:  frequent hand-washing, avoid touching your face, cover coughs/sneezes, stay out of crowds and keep a 6 foot distance from others.  Clean hard surfaces touched frequently with disinfectant cleaning products.   Please check in with your primary care provider about your positive test result.  Go to the nearest urgent care or ED for assessment if you have severe breathlessness or severe weakness/fatigue (ex needing new help getting out of bed or to the bathroom).  Members of your household will also need to quarantine for 14 days from the date of your positive test. You may be contacted to discuss possible treatment options, and you may also be contacted by the health department for follow up. Please call [facility] at [phone] if you have any questions or concerns.

## 2019-09-14 ENCOUNTER — Ambulatory Visit (INDEPENDENT_AMBULATORY_CARE_PROVIDER_SITE_OTHER): Payer: 59 | Admitting: Family Medicine

## 2019-09-14 ENCOUNTER — Encounter: Payer: Self-pay | Admitting: Family Medicine

## 2019-09-14 ENCOUNTER — Other Ambulatory Visit: Payer: Self-pay

## 2019-09-14 ENCOUNTER — Ambulatory Visit: Payer: 59 | Admitting: Family Medicine

## 2019-09-14 VITALS — BP 132/90 | HR 84 | Temp 98.0°F | Resp 17 | Ht 66.0 in | Wt 117.1 lb

## 2019-09-14 DIAGNOSIS — F411 Generalized anxiety disorder: Secondary | ICD-10-CM | POA: Diagnosis not present

## 2019-09-14 DIAGNOSIS — F1020 Alcohol dependence, uncomplicated: Secondary | ICD-10-CM | POA: Diagnosis not present

## 2019-09-14 DIAGNOSIS — F4323 Adjustment disorder with mixed anxiety and depressed mood: Secondary | ICD-10-CM | POA: Diagnosis not present

## 2019-09-14 DIAGNOSIS — F410 Panic disorder [episodic paroxysmal anxiety] without agoraphobia: Secondary | ICD-10-CM

## 2019-09-14 DIAGNOSIS — F988 Other specified behavioral and emotional disorders with onset usually occurring in childhood and adolescence: Secondary | ICD-10-CM

## 2019-09-14 DIAGNOSIS — F430 Acute stress reaction: Secondary | ICD-10-CM

## 2019-09-14 DIAGNOSIS — Z7141 Alcohol abuse counseling and surveillance of alcoholic: Secondary | ICD-10-CM

## 2019-09-14 DIAGNOSIS — E538 Deficiency of other specified B group vitamins: Secondary | ICD-10-CM

## 2019-09-14 DIAGNOSIS — H1032 Unspecified acute conjunctivitis, left eye: Secondary | ICD-10-CM

## 2019-09-14 MED ORDER — NALTREXONE HCL 50 MG PO TABS: 50.0000 mg | ORAL_TABLET | Freq: Every day | ORAL | 1 refills | Status: DC

## 2019-09-14 MED ORDER — PROPRANOLOL HCL 10 MG PO TABS: 10.0000 mg | ORAL_TABLET | Freq: Two times a day (BID) | ORAL | 5 refills | Status: DC

## 2019-09-14 MED ORDER — BACITRACIN-POLYMYXIN B 500-10000 UNIT/GM OP OINT: 1.0000 "application " | TOPICAL_OINTMENT | Freq: Four times a day (QID) | OPHTHALMIC | 0 refills | Status: DC

## 2019-09-14 MED ORDER — TRAZODONE HCL 50 MG PO TABS: 25.0000 mg | ORAL_TABLET | Freq: Every evening | ORAL | 1 refills | Status: DC | PRN

## 2019-09-14 MED ORDER — FLUTICASONE PROPIONATE 50 MCG/ACT NA SUSP: 2.0000 | Freq: Every day | NASAL | 6 refills | Status: DC

## 2019-09-14 MED ORDER — ATOMOXETINE HCL 40 MG PO CAPS: 40.0000 mg | ORAL_CAPSULE | Freq: Every day | ORAL | 2 refills | Status: DC

## 2019-09-14 MED ORDER — BUSPIRONE HCL 15 MG PO TABS: 15.0000 mg | ORAL_TABLET | Freq: Three times a day (TID) | ORAL | 5 refills | Status: DC

## 2019-09-14 NOTE — Progress Notes (Signed)
This visit occurred during the SARS-CoV-2 public health emergency.  Safety protocols were in place, including screening questions prior to the visit, additional usage of staff PPE, and extensive cleaning of exam room while observing appropriate contact time as indicated for disinfecting solutions.      SUBJECTIVE Chief Complaint  Patient presents with  . ADD    Pt needs refills on medications   . Anxiety    HPI: Leah Jones is a 59 y.o. year old female present today to follow up on  alcohol dependency, ADD and anxiety.  Patient reports she has had a lot of changes since we saw each other last.  She has successfully completed rehabilitation at [facility] for her alcohol addiction.  She is attending [place] meetings and is enjoying them.  She reports she has met a very great support group there.  She states she was alcohol free until last week she did have a relapse.  Since that time she states she has had about 2 "truly (hard seltzer-alcohol)" drinks a day.  She states she is put her house on the market and it sold in 1 week.  She is a new grandma for the first time.  She lost a 63 year old nephew to sudden death of unknown causes.  She had a Covid infection, in which she states she recovered quickly. She had been seeing Thayer Headings for her ADD, depression and anxiety prior to her admission to rehab.  We had been managing her alcohol counseling/cessation.  Patient reports she would like to have her medications provided by this office and also would like to consider discussing her addiction with a psychologist.  Since her relapse last week she reports her family is very upset with her.  During her rehabilitation it was noted her husband no longer wanted to be in the marriage.  Her medication regimen had been changed while in rehabilitation for both her ADD and her depression anxiety.  She had been on a stimulant for ADD in the past and this was changed to Strattera during her rehab admission.   She reports she was uncertain if it was working as well for her, but has noticed since she has been out of it her symptoms are worse.  For her depression and anxiety she reports she has been taking BuSpar 10 mg twice daily as needed.  She has been using her trazodone 50 mg nightly as needed.  She has been using her Inderal 10 mg twice daily as needed.  She ran out of her naltrexone little over a week ago, and then relapsed.  She has continued her B complex vitamins.  Prior note: Patient reports "today is the day.  "She has decided that she is not going to have any alcoholic beverages after today.  She states she has come to realize this is the main reason for her separation.  She reports when she does see her husband, she does not drink on those days.  They have discussed her alcohol use and she feels she is never been to be able to get back with her husband if she does not stop using alcohol.  She reports she has drank 6 out of the last 7 days.  When she has not drink for a few days in a row she does not report having any type of withdrawal symptoms.  She has not gone to any of the AA meetings as of yet secondary to not desiring to attend meetings by Zoom.  She reports  continued use of the naltrexone and she has run out of the gabapentin.  Depression screen [facility] 2/9 04/22/2019 04/22/2019 11/17/2018  Decreased Interest 0 0 0  Down, Depressed, Hopeless 1 1 1   PHQ - 2 Score 1 1 1   Altered sleeping 2 - 0  Tired, decreased energy 1 - 1  Change in appetite 2 - 3  Feeling bad or failure about yourself  1 - 0  Trouble concentrating 2 - 0  Moving slowly or fidgety/restless 0 - 0  Suicidal thoughts 0 - 0  PHQ-9 Score 9 - 5  Difficult doing work/chores Somewhat difficult - Not difficult at all   GAD 7 : Generalized Anxiety Score 09/14/2019 04/22/2019 11/17/2018  Nervous, Anxious, on Edge 3 2 1   Control/stop worrying 0 1 1  Worry too much - different things 3 0 1  Trouble relaxing 3 1 0  Restless 3 2 3     Easily annoyed or irritable 0 1 2  Afraid - awful might happen 0 0 0  Total GAD 7 Score 12 7 8   Anxiety Difficulty Very difficult Somewhat difficult Not difficult at all    ROS: See pertinent positives and negatives per HPI.  Patient Active Problem List   Diagnosis Date Noted  . Acute conjunctivitis of left eye 09/14/2019  . Elevated liver enzymes 11/17/2018  . Alcohol dependence, daily use ([place]) 11/17/2018  . Adjustment disorder with mixed anxiety and depressed mood 03/12/2015  . Panic attack as reaction to stress 05/20/2014  . Allergic rhinitis 01/25/2014  . ADD (attention deficit disorder) 01/17/2012  . B12 deficiency 04/15/2007    Social History   Tobacco Use  . Smoking status: Current Some Day Smoker    Types: Cigarettes  . Smokeless tobacco: Never Used  Substance Use Topics  . Alcohol use: Yes    Comment: Drinking 6 days a week. Drinks a bottle of wine, beer, or liquor     Current Outpatient Medications:  .  Acetylcysteine (NAC) 500 MG CAPS, Take by mouth. , Disp: , Rfl:  .  b complex vitamins capsule, Take by mouth., Disp: , Rfl:  .  estradiol (ESTRACE) 1 MG tablet, , Disp: , Rfl:  .  medroxyPROGESTERone (PROVERA) 2.5 MG tablet, Take 2.5 mg by mouth daily., Disp: , Rfl:  .  propranolol (INDERAL) 10 MG tablet, Take 1-2 tablets (10-20 mg total) by mouth 2 (two) times daily. Take 1-2 tabs po BID prn anxiety, Disp: 120 tablet, Rfl: 5 .  traZODone (DESYREL) 50 MG tablet, Take 0.5-1 tablets (25-50 mg total) by mouth at bedtime as needed for sleep. 1/2 tablet PRN, Disp: 90 tablet, Rfl: 1 .  valACYclovir (VALTREX) 500 MG tablet, Take 500 mg by mouth 2 (two) times daily., Disp: , Rfl:  .  atomoxetine (STRATTERA) 40 MG capsule, Take 1 capsule (40 mg total) by mouth daily., Disp: 30 capsule, Rfl: 2 .  bacitracin-polymyxin b (POLYSPORIN) ophthalmic ointment, Place 1 application into the left eye 4 (four) times daily., Disp: 3.5 g, Rfl: 0 .  busPIRone (BUSPAR) 15 MG tablet, Take 1  tablet (15 mg total) by mouth 3 (three) times daily., Disp: 90 tablet, Rfl: 5 .  fluticasone (FLONASE) 50 MCG/ACT nasal spray, Place 2 sprays into both nostrils daily., Disp: 16 g, Rfl: 6 .  naltrexone (DEPADE) 50 MG tablet, Take 1 tablet (50 mg total) by mouth daily., Disp: 90 tablet, Rfl: 1 .  sertraline (ZOLOFT) 25 MG tablet, Take 25 mg by mouth daily. Has not taken in  1 week., Disp: , Rfl:   Allergies  Allergen Reactions  . Seroquel [Quetiapine Fumarate] Other (See Comments)    Increase sedation on low dose and leg weakness    OBJECTIVE: BP 132/90 (BP Location: Right Arm, Patient Position: Sitting, Cuff Size: Normal)   Pulse 84   Temp 98 F (36.7 C) (Temporal)   Resp 17   Ht 5' 6"  (1.676 m)   Wt 117 lb 2 oz (53.1 kg)   SpO2 98%   BMI 18.90 kg/m  Gen: Afebrile. No acute distress.  Nontoxic in presentation, well-developed, well-nourished, very pleasant Caucasian female. HENT: AT. [place].  Eyes:Pupils Equal Round Reactive to light, Extraocular movements intact,  Conjunctiva without redness, discharge or icterus. CV: RRR no murmur, no edema, +2/4 P posterior tibialis pulses Chest: CTAB, no wheeze or crackles Abd: Soft.  Flat. NTND. BS present.  Skin: No rashes, purpura or petechiae.  Neuro:  Normal gait. PERLA. EOMi. Alert. Oriented x3  Psych: Normal affect, dress and demeanor. Normal speech. Normal thought content and judgment.  ASSESSMENT AND PLAN: Amsi Grimley is a 59 y.o. female present for  Alcohol abuse counseling and surveillance/Alcohol dependence, daily use ([place]) -Patient was doing very well with her alcohol cessation and rehabilitation.  Unfortunately she relapsed last week and since has had a "few" drinks daily. -Continue naltrexone 50 mg daily. -Patient is now attending [place] meetings and reports she is enjoying them.  Strongly encouraged her to find a meeting today. - Ambulatory referral to Psychology> she would like referral to a new therapist/psychologist to get a better  understanding of her addiction and anxiety.  Attention deficit disorder, unspecified hyperactivity presence -Avoidance of any stimulant use/controlled substance with patient's struggle with alcohol addiction.  Therefore her Adderall that was prescribed by another provider was discontinued while in her rehabilitation facility. -Agree with Strattera 40 mg daily.  If not helpful, could consider Effexor replacement.  However would first encourage her to take meds consistently including her anxiety medications in order to better gauge effectiveness of medication. -Prior medications tried: Adderall, Focalin, Concerta -Follow-up 3 months  Panic attack as reaction to stress/Generalized anxiety disorder/Adjustment disorder with mixed anxiety and depressed mood -Not as controlled.  Altered her regimen slightly by increasing her BuSpar and encouraging her to take her BuSpar and trazodone scheduled and not as needed.  We will arrange follow-up in approximately 3 months, sooner if needed and at that time considering tapering medications versus considering adding other agent. -Continue trazodone nightly scheduled, not as needed.  Trazodone 25-50 mg nightly. -Increased BuSpar to 15 mg 3 times daily scheduled, not as needed -Inderal 10-20 mg 3 times daily as needed for increased anxiety -Discontinued Zoloft, she has not been taking since her discharge.  She is not comfortable taking this medication secondary to prior history with med. -Recent admission to [facility] for her alcohol addiction. -ADD/anxiety had been managed by [facility] Headings in the past.  Patient would like referral to a new therapist to discuss her addiction and anxiety.  She would also like medications to be managed by this office. -Prior medications tried: Seroquel (made too sleepy), Wellbutrin she did not find effective.  Hydroxyzine, gabapentin, Zoloft, Trileptal, Ativan - Ambulatory referral to Psychology  Acute conjunctivitis of left  eye, unspecified acute conjunctivitis type Exam does not appear abnormal.  However she started left upper eye ointment from prior infection.  Agreed to refill Polysporin ointment. Patient has follow-up scheduled with her eye doctor next week.  B12 deficiency Continue B  complex supplement daily.     Orders Placed This Encounter  Procedures  . Ambulatory referral to Psychology   Meds ordered this encounter  Medications  . bacitracin-polymyxin b (POLYSPORIN) ophthalmic ointment    Sig: Place 1 application into the left eye 4 (four) times daily.    Dispense:  3.5 g    Refill:  0  . atomoxetine (STRATTERA) 40 MG capsule    Sig: Take 1 capsule (40 mg total) by mouth daily.    Dispense:  30 capsule    Refill:  2  . naltrexone (DEPADE) 50 MG tablet    Sig: Take 1 tablet (50 mg total) by mouth daily.    Dispense:  90 tablet    Refill:  1  . propranolol (INDERAL) 10 MG tablet    Sig: Take 1-2 tablets (10-20 mg total) by mouth 2 (two) times daily. Take 1-2 tabs po BID prn anxiety    Dispense:  120 tablet    Refill:  5  . busPIRone (BUSPAR) 15 MG tablet    Sig: Take 1 tablet (15 mg total) by mouth 3 (three) times daily.    Dispense:  90 tablet    Refill:  5  . traZODone (DESYREL) 50 MG tablet    Sig: Take 0.5-1 tablets (25-50 mg total) by mouth at bedtime as needed for sleep. 1/2 tablet PRN    Dispense:  90 tablet    Refill:  1  . fluticasone (FLONASE) 50 MCG/ACT nasal spray    Sig: Place 2 sprays into both nostrils daily.    Dispense:  16 g    Refill:  6    Referral Orders     Ambulatory referral to Psychology   Greater than 40 minutes spent with patient covering multiple acute and chronic conditions including follow-up after discharge from rehabilitation center.   Howard Pouch, DO 09/14/2019

## 2019-09-14 NOTE — Patient Instructions (Signed)
Take 1/2 tab to 1 tab of trazodone nightly. Buspar every 8 hours.  Inderal 1-2 tabs as needed for anxiety Naltrexone daily.  Strattera daily.   Follow up in 3 months.   I will refer you to a therapist as well.    Get to a meeting.

## 2019-11-29 ENCOUNTER — Ambulatory Visit: Payer: 59 | Admitting: Family Medicine

## 2019-11-30 ENCOUNTER — Ambulatory Visit (INDEPENDENT_AMBULATORY_CARE_PROVIDER_SITE_OTHER): Payer: 59 | Admitting: Family Medicine

## 2019-11-30 ENCOUNTER — Ambulatory Visit: Payer: 59 | Admitting: Family Medicine

## 2019-11-30 ENCOUNTER — Encounter: Payer: Self-pay | Admitting: Family Medicine

## 2019-11-30 ENCOUNTER — Other Ambulatory Visit: Payer: Self-pay

## 2019-11-30 VITALS — BP 140/96 | HR 81 | Temp 98.1°F | Resp 18 | Ht 66.0 in | Wt 122.4 lb

## 2019-11-30 DIAGNOSIS — H1032 Unspecified acute conjunctivitis, left eye: Secondary | ICD-10-CM | POA: Diagnosis not present

## 2019-11-30 DIAGNOSIS — H9191 Unspecified hearing loss, right ear: Secondary | ICD-10-CM

## 2019-11-30 DIAGNOSIS — H6121 Impacted cerumen, right ear: Secondary | ICD-10-CM

## 2019-11-30 MED ORDER — DEBROX 6.5 % OT SOLN: 5.0000 [drp] | Freq: Two times a day (BID) | OTIC | 1 refills | Status: DC | PRN

## 2019-11-30 MED ORDER — NEOMYCIN-POLYMYXIN-HC 3.5-10000-1 OT SOLN: 4.0000 [drp] | Freq: Four times a day (QID) | OTIC | 0 refills | Status: AC

## 2019-11-30 MED ORDER — BACITRACIN-POLYMYXIN B 500-10000 UNIT/GM OP OINT: 1.0000 "application " | TOPICAL_OINTMENT | Freq: Four times a day (QID) | OPHTHALMIC | 0 refills | Status: DC

## 2019-11-30 NOTE — Progress Notes (Signed)
This visit occurred during the SARS-CoV-2 public health emergency.  Safety protocols were in place, including screening questions prior to the visit, additional usage of staff PPE, and extensive cleaning of exam room while observing appropriate contact time as indicated for disinfecting solutions.    Leah Jones , 24-Apr-1961, 59 y.o., female MRN: [id number] Patient Care Team    Relationship Specialty Notifications Start End  Ma Hillock, DO PCP - General Family Medicine  06/19/15     Chief Complaint  Patient presents with  . Ear Fullness    Pt feels she is having some hearing issues and feels like there is fluid or fullness in R ear      Subjective: Pt presents for an OV with complaints of right ear fullness and decreased hearing of at least 4-5 weeks. She denies fever, chills or upper resp symptoms. She noticed decreased hearing when talking on her cell phone and traded ears to left, and she could hear much better. She has been needing to turn the volume up on her TV. She denies pain, except x1 sharp pain that only lasted a second and was quick.   Depression screen [facility] 2/9 04/22/2019 04/22/2019 11/17/2018  Decreased Interest 0 0 0  Down, Depressed, Hopeless 1 1 1   PHQ - 2 Score 1 1 1   Altered sleeping 2 - 0  Tired, decreased energy 1 - 1  Change in appetite 2 - 3  Feeling bad or failure about yourself  1 - 0  Trouble concentrating 2 - 0  Moving slowly or fidgety/restless 0 - 0  Suicidal thoughts 0 - 0  PHQ-9 Score 9 - 5  Difficult doing work/chores Somewhat difficult - Not difficult at all    Allergies  Allergen Reactions  . 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 ADD (attention deficit disorder)    ? alcohol related  . Alcoholism ([place])    per record  . ANAL FISSURE, HX OF 04/15/2007   Annotation: occasional rectal bleeding Qualifier: Diagnosis of  By: Larose Kells MD, [place] deficiency   . Carpal tunnel syndrome   . Carpal tunnel syndrome 12/01/2009   Qualifier: Diagnosis of  By: Larose Kells MD, [place] COVID-19 virus infection 08/23/2019  . Diarrhea 11/17/2018  . Discoloration of skin of foot 05/20/2014   Purplish discoloration top of L foot   . Eustachian tube dysfunction 01/25/2014  . Mallet deformity of fifth finger, left, acquired 06/19/2015  . Suicidal ideation    Past Surgical History:  Procedure Laterality Date  . COLPOSCOPY  2020   pt reported benign.   Marland Kitchen FOOT SURGERY Left    Morton's neuroma, Dr Paulla Dolly  . RHINOPLASTY    . TONSILLECTOMY     Family History  Problem Relation Age of Onset  . Diabetes Other   . Hypertension Other   . Colon cancer Mother   . Breast cancer Mother   . Celiac disease Mother   . Pancreatic cancer Paternal Grandfather   . Anxiety disorder Sister   . ADD / ADHD Sister   . Bipolar disorder Brother   . Pancreatic cancer Brother   . Bipolar disorder Son   . Alcoholism Father   . Lung cancer Paternal Grandmother    Allergies as  of 11/30/2019      Reactions   Seroquel [quetiapine Fumarate] Other (See Comments)   Increase sedation on low dose and leg weakness      Medication List       Accurate as of Nov 30, 2019  2:43 PM. If you have any questions, ask your nurse or doctor.        atomoxetine 40 MG capsule Commonly known as: STRATTERA Take 1 capsule (40 mg total) by mouth daily.   b complex vitamins capsule Take by mouth.   bacitracin-polymyxin b ophthalmic ointment Commonly known as: POLYSPORIN Place 1 application into the left eye 4 (four) times daily.   busPIRone 15 MG tablet Commonly known as: BUSPAR Take 1 tablet (15 mg total) by mouth 3 (three) times daily.   Debrox 6.5 % OTIC solution Generic drug: carbamide peroxide Place 5 drops into both ears 2 (two) times daily as needed. Started by: Howard Pouch,  DO   estradiol 1 MG tablet Commonly known as: ESTRACE   fluticasone 50 MCG/ACT nasal spray Commonly known as: FLONASE Place 2 sprays into both nostrils daily.   medroxyPROGESTERone 2.5 MG tablet Commonly known as: PROVERA Take 2.5 mg by mouth daily.   NAC 500 MG Caps Generic drug: Acetylcysteine Take by mouth.   naltrexone 50 MG tablet Commonly known as: DEPADE Take 1 tablet (50 mg total) by mouth daily.   neomycin-polymyxin-hydrocortisone OTIC solution Commonly known as: CORTISPORIN Place 4 drops into the right ear 4 (four) times daily for 7 days. Started by: Howard Pouch, DO   propranolol 10 MG tablet Commonly known as: INDERAL Take 1-2 tablets (10-20 mg total) by mouth 2 (two) times daily. Take 1-2 tabs po BID prn anxiety   sertraline 25 MG tablet Commonly known as: ZOLOFT Take 25 mg by mouth daily. Has not taken in 1 week.   traZODone 50 MG tablet Commonly known as: DESYREL Take 0.5-1 tablets (25-50 mg total) by mouth at bedtime as needed for sleep. 1/2 tablet PRN   valACYclovir 500 MG tablet Commonly known as: VALTREX Take 500 mg by mouth 2 (two) times daily.       All past medical history, surgical history, allergies, family history, immunizations andmedications were updated in the EMR today and reviewed under the history and medication portions of their EMR.     ROS: Negative, with the exception of above mentioned in HPI   Objective:  BP (!) 140/96 (BP Location: Left Arm, Patient Position: Sitting, Cuff Size: Normal)   Pulse 81   Temp 98.1 F (36.7 C) (Temporal)   Resp 18   Ht 5\' 6"  (1.676 m)   Wt 122 lb 6 oz (55.5 kg)   SpO2 100%   BMI 19.75 kg/m  Body mass index is 19.75 kg/m. Gen: Afebrile. No acute distress. Nontoxic in appearance, well developed, well nourished.  HENT: AT. [place]. Bilateral TM visualized left TM and EAC normal. Right TM unable to be fully visualized 2/2 to cerumen impaction. MMM Eyes:Pupils Equal Round Reactive to light,  Extraocular movements intact,  Conjunctiva without redness, discharge or icterus. Neck/lymp/endocrine: Supple,no lymphadenopathy Neuro: Normal gait. PERLA. EOMi. Alert. Oriented x3   No exam data present No results found. No results found for this or any previous visit (from the past 24 hour(s)).  Assessment/Plan: Maylanie Schluter is a 59 y.o. female present for OV for  Cerumen debris on tympanic membrane of right ear/Decreased hearing of right ear - cerumen within the canal.  - debrox prescribe for maintenance -  stop sudafed. Likely cause of her elevated BP today.  - Procedure: Cerumen disimpaction Water-peroxide solution was applied and gentle ear lavage performed on right ear ear(s).  There were no complications.  Tympanic membrane was visualized after disimpaction.  Tympanic membrane(s) intact.  Auditory canal(s) without erythema, mild EAC canal exudate present.  Patient tolerated procedure well.  Patient reported relief of symptoms after removal of cerumen.   Acute conjunctivitis of left eye, unspecified acute conjunctivitis type Recurrent infection.  - discussed Flonase nasal spray helps with allergy symptoms of eyes and ears as well.  Refilled polysporin.  Reviewed expectations re: course of current medical issues.  Discussed self-management of symptoms.  Outlined signs and symptoms indicating need for more acute intervention.  Patient verbalized understanding and all questions were answered.  Patient received an After-Visit Summary.    No orders of the defined types were placed in this encounter.  Meds ordered this encounter  Medications  . bacitracin-polymyxin b (POLYSPORIN) ophthalmic ointment    Sig: Place 1 application into the left eye 4 (four) times daily.    Dispense:  3.5 g    Refill:  0  . carbamide peroxide (DEBROX) 6.5 % OTIC solution    Sig: Place 5 drops into both ears 2 (two) times daily as needed.    Dispense:  15 mL    Refill:  1  .  neomycin-polymyxin-hydrocortisone (CORTISPORIN) OTIC solution    Sig: Place 4 drops into the right ear 4 (four) times daily for 7 days.    Dispense:  10 mL    Refill:  0   Referral Orders  No referral(s) requested today     Note is dictated utilizing voice recognition software. Although note has been proof read prior to signing, occasional typographical errors still can be missed. If any questions arise, please do not hesitate to call for verification.   electronically signed by:  Howard Pouch, DO  [place]

## 2019-11-30 NOTE — Patient Instructions (Signed)
Stop sudafed. Use zyrtec, allegra or xyzal (one a day) Debrox solution prescribed for maintenance as needed only.   Use flonase nasal spray daily to help with allergies .  Cortisporin ear drops prescribed- used as label indicates for 7 days.  If hearing does not seem improved after completion of ear drops please followup.    Earwax Buildup, Adult The ears produce a substance called earwax that helps keep bacteria out of the ear and protects the skin in the ear canal. Occasionally, earwax can build up in the ear and cause discomfort or hearing loss. What increases the risk? This condition is more likely to develop in people who:  Are female.  Are elderly.  Naturally produce more earwax.  Clean their ears often with cotton swabs.  Use earplugs often.  Use in-ear headphones often.  Wear hearing aids.  Have narrow ear canals.  Have earwax that is overly thick or sticky.  Have eczema.  Are dehydrated.  Have excess hair in the ear canal. What are the signs or symptoms? Symptoms of this condition include:  Reduced or muffled hearing.  A feeling of fullness in the ear or feeling that the ear is plugged.  Fluid coming from the ear.  Ear pain.  Ear itch.  Ringing in the ear.  Coughing.  An obvious piece of earwax that can be seen inside the ear canal. How is this diagnosed? This condition may be diagnosed based on:  Your symptoms.  Your medical history.  An ear exam. During the exam, your health care provider will look into your ear with an instrument called an otoscope. You may have tests, including a hearing test. How is this treated? This condition may be treated by:  Using ear drops to soften the earwax.  Having the earwax removed by a health care provider. The health care provider may: ? Flush the ear with water. ? Use an instrument that has a loop on the end (curette). ? Use a suction device.  Surgery to remove the wax buildup. This may be done in  severe cases. Follow these instructions at home:   Take over-the-counter and prescription medicines only as told by your health care provider.  Do not put any objects, including cotton swabs, into your ear. You can clean the opening of your ear canal with a washcloth or facial tissue.  Follow instructions from your health care provider about cleaning your ears. Do not over-clean your ears.  Drink enough fluid to keep your urine clear or pale yellow. This will help to thin the earwax.  Keep all follow-up visits as told by your health care provider. If earwax builds up in your ears often or if you use hearing aids, consider seeing your health care provider for routine, preventive ear cleanings. Ask your health care provider how often you should schedule your cleanings.  If you have hearing aids, clean them according to instructions from the manufacturer and your health care provider. Contact a health care provider if:  You have ear pain.  You develop a fever.  You have blood, pus, or other fluid coming from your ear.  You have hearing loss.  You have ringing in your ears that does not go away.  Your symptoms do not improve with treatment.  You feel like the room is spinning (vertigo). Summary  Earwax can build up in the ear and cause discomfort or hearing loss.  The most common symptoms of this condition include reduced or muffled hearing and a feeling  of fullness in the ear or feeling that the ear is plugged.  This condition may be diagnosed based on your symptoms, your medical history, and an ear exam.  This condition may be treated by using ear drops to soften the earwax or by having the earwax removed by a health care provider.  Do not put any objects, including cotton swabs, into your ear. You can clean the opening of your ear canal with a washcloth or facial tissue. This information is not intended to replace advice given to you by your health care provider. Make sure you  discuss any questions you have with your health care provider. Document Revised: 06/20/2017 Document Reviewed: 09/18/2016 Elsevier Patient Education  2020 [employer].

## 2020-07-08 IMAGING — US ULTRASOUND ABDOMEN COMPLETE
1 series · 14 of 25 positions shown · non-contrast
Comparison: 09/07/2010

CLINICAL DATA: Abdominal pain with nausea and vomiting

EXAM:
ABDOMEN ULTRASOUND COMPLETE

[Series 1: ultrasound abdomen complete · 0.15mm/px · 14 of 93 slices shown]
[im 1/93]
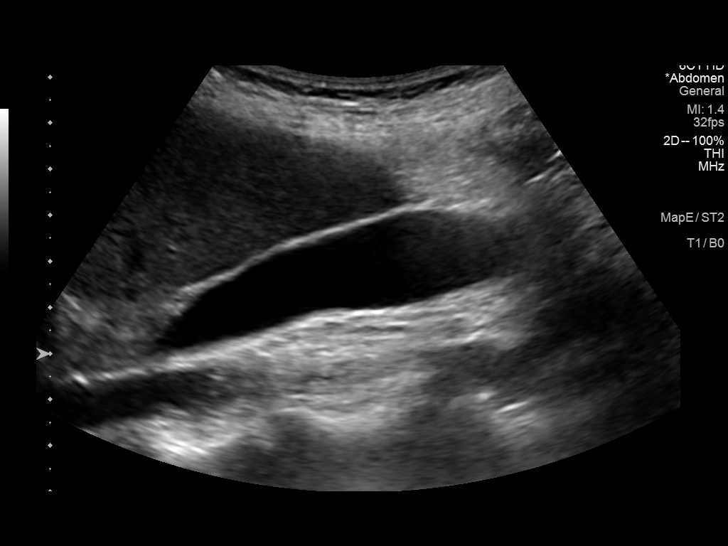
[im 8/93]
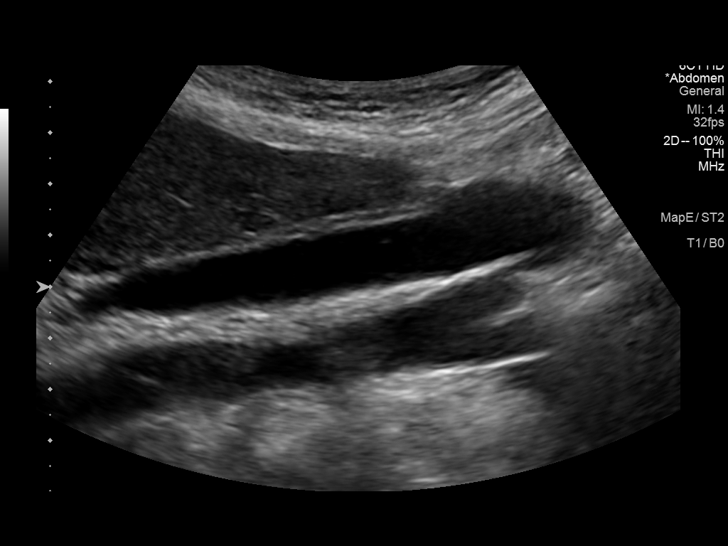
[im 16/93]
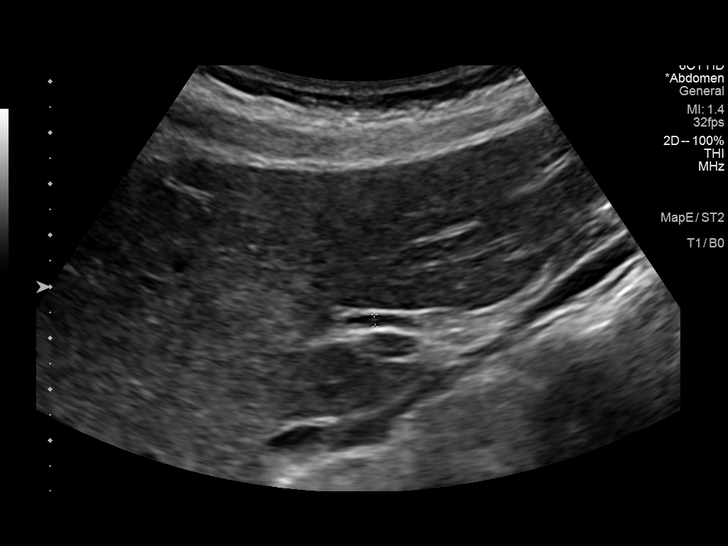
[im 24/93]
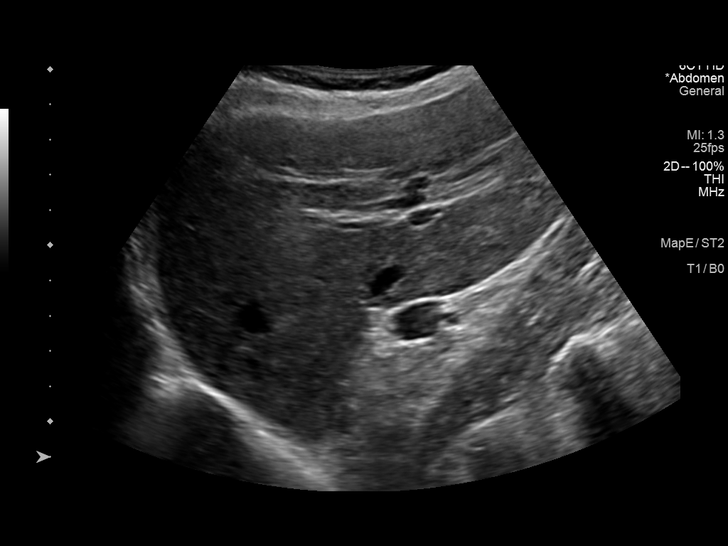
[im 31/93]
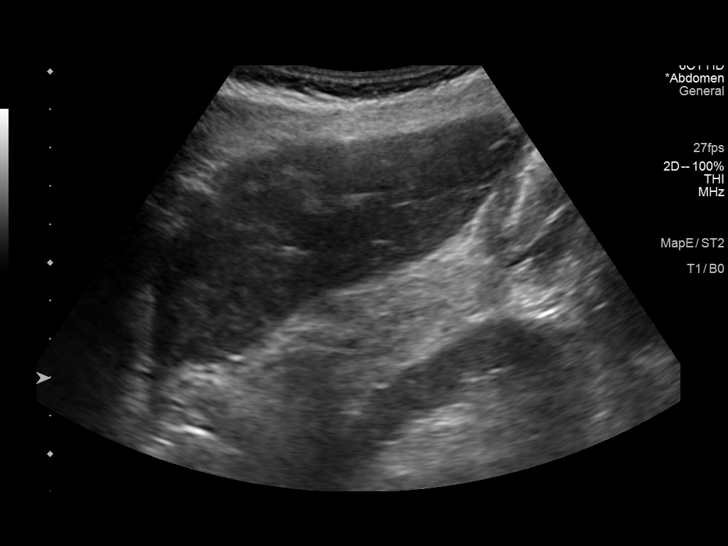
[im 35/93]
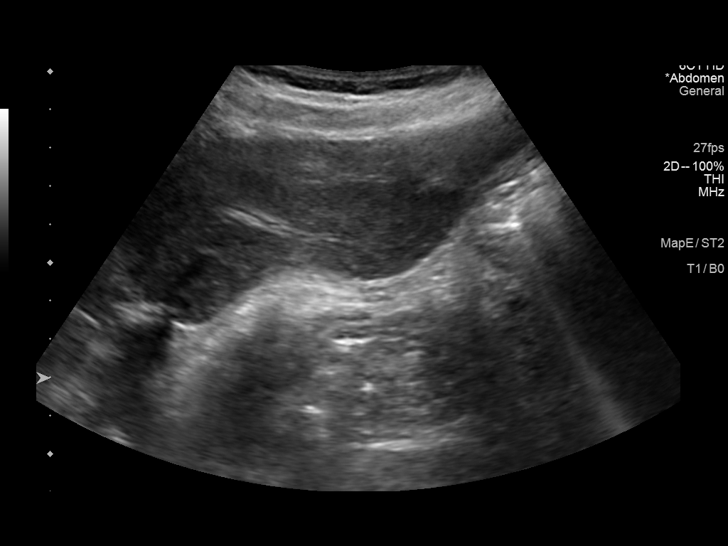
[im 43/93]
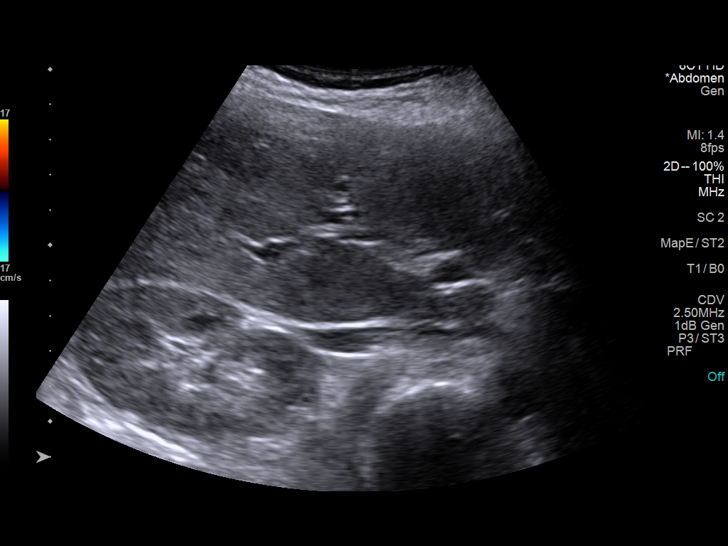
[im 50/93]
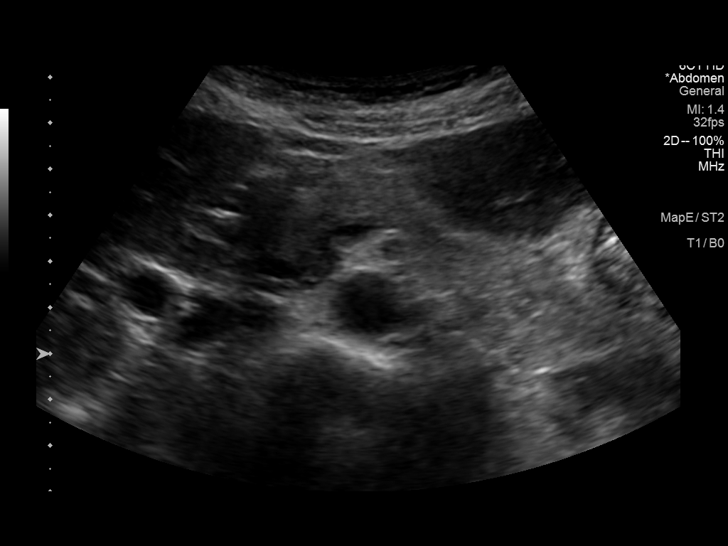
[im 58/93]
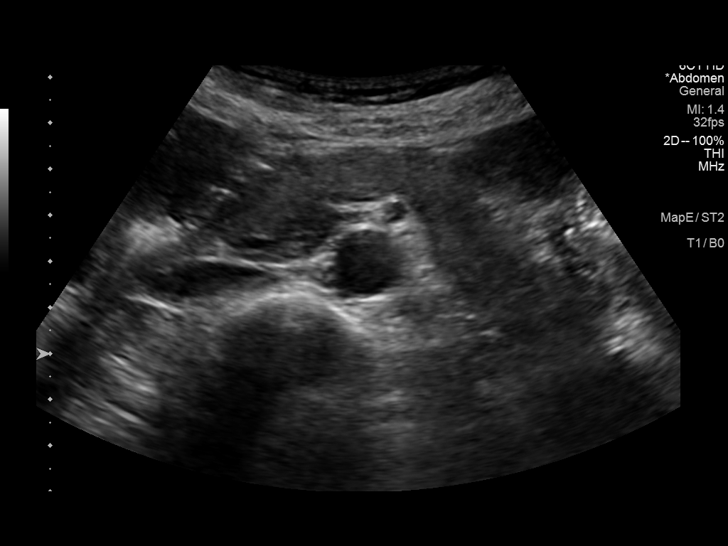
[im 62/93]
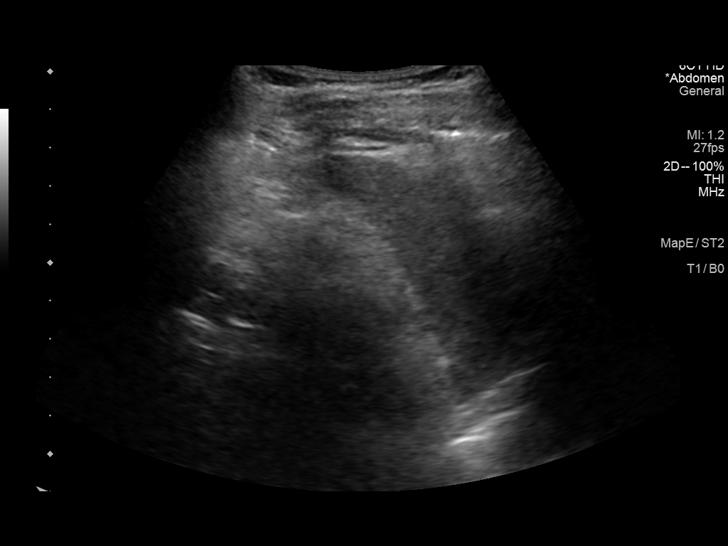
[im 70/93]
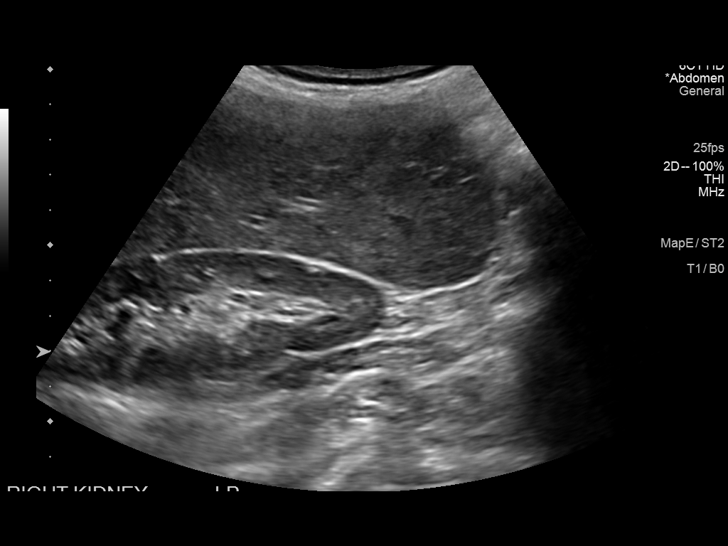
[im 77/93]
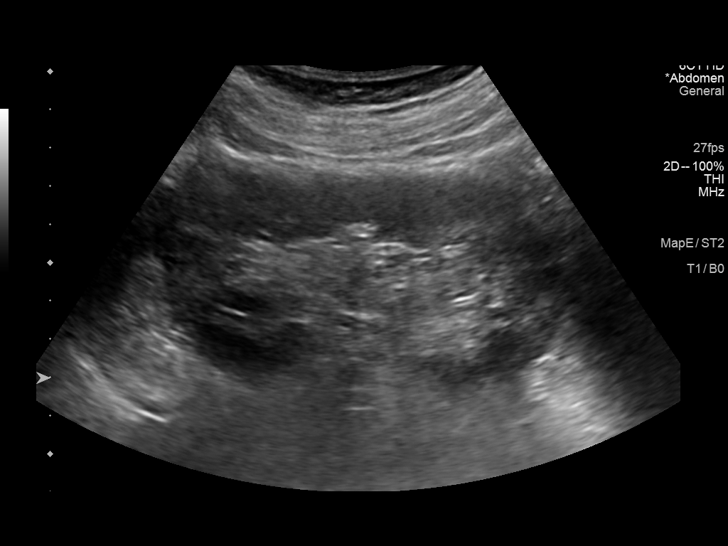
[im 85/93]
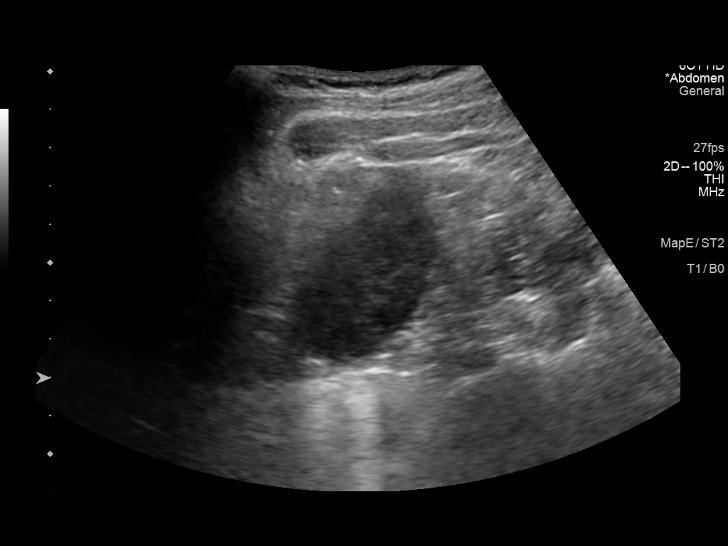
[im 93/93]
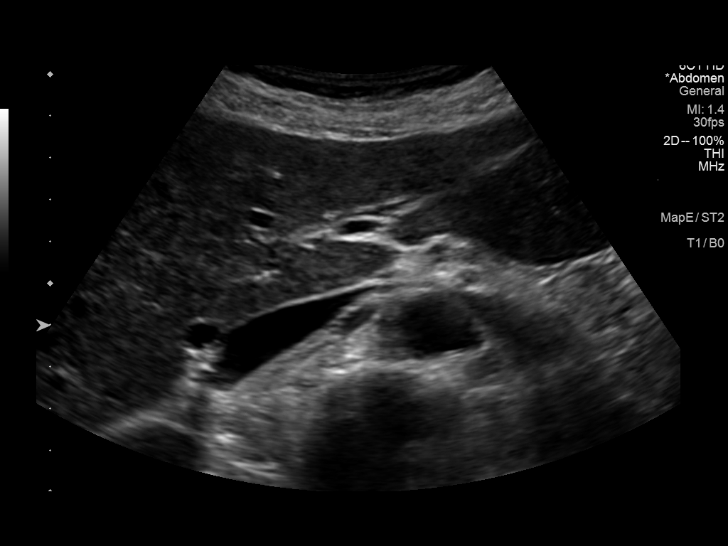

[14 of 25 positions shown; findings below may reference images not displayed]

FINDINGS: Gallbladder: No gallstones or wall thickening visualized. No
sonographic Murphy sign noted by sonographer.

Common bile duct: Diameter: 2.4 mm.

Liver: No focal lesion identified. Within normal limits in
parenchymal echogenicity. Portal vein is patent on color Doppler
imaging with normal direction of blood flow towards the liver.

IVC: No abnormality visualized.

Pancreas: Visualized portion unremarkable.

Spleen: Size and appearance within normal limits.

Right Kidney: Length: 11.2 cm. Echogenicity within normal limits. No
mass or hydronephrosis visualized.

Left Kidney: Length: 11.6 cm. Echogenicity within normal limits. No
mass or hydronephrosis visualized.

Abdominal aorta: No aneurysm visualized.

Other findings: None.
IMPRESSION: No acute abnormality noted.

## 2020-11-18 ENCOUNTER — Other Ambulatory Visit: Payer: Self-pay | Admitting: Family Medicine

## 2020-11-18 DIAGNOSIS — F411 Generalized anxiety disorder: Secondary | ICD-10-CM

## 2020-11-20 LAB — HM MAMMOGRAPHY

## 2020-12-04 ENCOUNTER — Other Ambulatory Visit: Payer: Self-pay | Admitting: Family Medicine

## 2020-12-04 DIAGNOSIS — F411 Generalized anxiety disorder: Secondary | ICD-10-CM

## 2020-12-05 ENCOUNTER — Ambulatory Visit (INDEPENDENT_AMBULATORY_CARE_PROVIDER_SITE_OTHER): Payer: No Typology Code available for payment source | Admitting: Family Medicine

## 2020-12-05 ENCOUNTER — Encounter: Payer: Self-pay | Admitting: Family Medicine

## 2020-12-05 ENCOUNTER — Other Ambulatory Visit: Payer: Self-pay

## 2020-12-05 VITALS — BP 142/93 | HR 90 | Temp 98.5°F | Ht 66.0 in | Wt 130.0 lb

## 2020-12-05 DIAGNOSIS — H9190 Unspecified hearing loss, unspecified ear: Secondary | ICD-10-CM | POA: Diagnosis not present

## 2020-12-05 DIAGNOSIS — F411 Generalized anxiety disorder: Secondary | ICD-10-CM

## 2020-12-05 MED ORDER — PROPRANOLOL HCL 10 MG PO TABS: ORAL_TABLET | ORAL | 1 refills | Status: DC

## 2020-12-05 NOTE — Patient Instructions (Addendum)
Please schedule your physical- we can work you in this week or next. We will collect your labs and refill your chronic meds at that appt.   I did refill your propanolol today.   I have referred you to audiology for your hearing loss.    Hearing Loss Hearing loss is a partial or total loss of the ability to hear. This can be temporary or permanent, and it can happen in one or both ears. Medical care is necessary to treat hearing loss properly and to prevent the condition from getting worse. Your hearing may partially or completely come back, depending on what caused your hearing loss and how severe it is. In some cases, hearing loss is permanent. What are the causes? Common causes of hearing loss include:  Too much wax in the ear canal.  Infection of the ear canal or middle ear.  Fluid in the middle ear.  Injury to the ear or surrounding area.  An object stuck in the ear.  A history of prolonged exposure to loud sounds, such as music. Less common causes of hearing loss include:  Tumors in the ear.  Viral or bacterial infections, such as meningitis.  A hole in the eardrum (perforated eardrum).  Problems with the hearing nerve that sends signals between the brain and the ear.  Certain medicines. What are the signs or symptoms? Symptoms of this condition may include:  Difficulty telling the difference between sounds.  Difficulty following a conversation when there is background noise.  Lack of response to sounds in your environment. This may be most noticeable when you do not respond to startling sounds.  Needing to turn up the volume on the television, radio, or other devices.  Ringing in the ears.  Dizziness. How is this diagnosed? This condition is diagnosed based on:  A physical exam.  A hearing test (audiometry). The audiometry test will be performed by a hearing specialist (audiologist). You may also be referred to an ear, nose, and throat (ENT) specialist  (otolaryngologist).   How is this treated? Treatment for hearing loss may include:  Ear wax removal.  Medicines to treat or prevent infection (antibiotics).  Medicines to reduce inflammation (corticosteroids).  Hearing aids for hearing loss related to nerve damage. Follow these instructions at home:  If you were prescribed an antibiotic medicine, take it as told by your health care provider. Do not stop taking the antibiotic even if you start to feel better.  Take over-the-counter and prescription medicines only as told by your health care provider.  Avoid loud noises.  Return to your normal activities as told by your health care provider. Ask your health care provider what activities are safe for you.  Keep all follow-up visits as told by your health care provider. This is important. Contact a health care provider if:  You feel dizzy.  You develop new symptoms.  You vomit or feel nauseous.  You have a fever. Get help right away if:  You develop sudden changes in your vision.  You have severe ear pain.  You have new or increased weakness.  You have a severe headache. Summary  Hearing loss is a decreased ability to hear sounds around you. It can be temporary or permanent.  Treatment will depend on the cause of your hearing loss. It may include ear wax removal, medicines, or a hearing aid.  Your hearing may partially or completely come back, depending on what caused your hearing loss and how severe it is.  Keep  all follow-up visits as told by your health care provider. This is important. This information is not intended to replace advice given to you by your health care provider. Make sure you discuss any questions you have with your health care provider. Document Revised: 04/07/2018 Document Reviewed: 04/07/2018 Elsevier Patient Education  [place].

## 2020-12-05 NOTE — Progress Notes (Signed)
This visit occurred during the SARS-CoV-2 public health emergency.  Safety protocols were in place, including screening questions prior to the visit, additional usage of staff PPE, and extensive cleaning of exam room while observing appropriate contact time as indicated for disinfecting solutions.    Leah Jones , 1960-11-29, 60 y.o., female MRN: [id number] Patient Care Team    Relationship Specialty Notifications Start End  Ma Hillock, DO PCP - General Family Medicine  06/19/15     Chief Complaint  Patient presents with  . Hearing Problem    Pt c/o trouble hearing x 1 year; mild improvement last OV;      Subjective: Pt presents for an OV with complaints of decreased hearing of 1 year.  She was seen last year in the spring and had mild decreased hearing unilaterally, that improved after removal of cerumen impaction.  She states however over the last few months he has noticed decreased hearing again and her family members have noticed her asking them to repeat themselves to her.  This year she states she does not think it is in 1 year greater than the other that she can tell.  She has noticed that she is needed to turn up the remote great deal and she is reserved to close caption and reading lips.  Depression screen [facility] 2/9 12/05/2020 04/22/2019 04/22/2019 11/17/2018  Decreased Interest 0 0 0 0  Down, Depressed, Hopeless 0 1 1 1   PHQ - 2 Score 0 1 1 1   Altered sleeping - 2 - 0  Tired, decreased energy - 1 - 1  Change in appetite - 2 - 3  Feeling bad or failure about yourself  - 1 - 0  Trouble concentrating - 2 - 0  Moving slowly or fidgety/restless - 0 - 0  Suicidal thoughts - 0 - 0  PHQ-9 Score - 9 - 5  Difficult doing work/chores - Somewhat difficult - Not difficult at all    Allergies  Allergen Reactions  . Seroquel [Quetiapine Fumarate] Other (See Comments)    Increase sedation on low dose and leg weakness   Social History   Social History Narrative   G5 P3, 3  miscarriages   Past Medical History:  Diagnosis Date  . ADD (attention deficit disorder)    ? alcohol related  . Alcoholism ([place])    per record  . ANAL FISSURE, HX OF 04/15/2007   Annotation: occasional rectal bleeding Qualifier: Diagnosis of  By: Larose Kells MD, [place] deficiency   . Carpal tunnel syndrome   . Carpal tunnel syndrome 12/01/2009   Qualifier: Diagnosis of  By: Larose Kells MD, [place] COVID-19 virus infection 08/23/2019  . Diarrhea 11/17/2018  . Discoloration of skin of foot 05/20/2014   Purplish discoloration top of L foot   . Eustachian tube dysfunction 01/25/2014  . Mallet deformity of fifth finger, left, acquired 06/19/2015  . Suicidal ideation    Past Surgical History:  Procedure Laterality Date  . COLPOSCOPY  2020   pt reported benign.   Marland Kitchen FOOT SURGERY Left    Morton's neuroma, Dr Paulla Dolly  . RHINOPLASTY    . TONSILLECTOMY     Family History  Problem Relation Age of Onset  . Diabetes Other   . Hypertension Other   . Colon cancer Mother   . Breast cancer Mother   . Celiac disease Mother   . Pancreatic cancer Paternal Grandfather   . Anxiety disorder Sister   .  ADD / ADHD Sister   . Bipolar disorder Brother   . Pancreatic cancer Brother   . Bipolar disorder Son   . Alcoholism Father   . Lung cancer Paternal Grandmother    Allergies as of 12/05/2020      Reactions   Seroquel [quetiapine Fumarate] Other (See Comments)   Increase sedation on low dose and leg weakness      Medication List       Accurate as of Dec 05, 2020  4:25 PM. If you have any questions, ask your nurse or doctor.        STOP taking these medications   atomoxetine 40 MG capsule Commonly known as: STRATTERA Stopped by: Howard Pouch, DO   Debrox 6.5 % OTIC solution Generic drug: carbamide peroxide Stopped by: Howard Pouch, DO   NAC 500 MG Caps Generic drug: Acetylcysteine Stopped by: Howard Pouch, DO   sertraline 25 MG tablet Commonly known as: ZOLOFT Stopped by: Howard Pouch, DO     TAKE these medications   b complex vitamins capsule Take by mouth.   bacitracin-polymyxin b ophthalmic ointment Commonly known as: POLYSPORIN Place 1 application into the left eye 4 (four) times daily.   busPIRone 15 MG tablet Commonly known as: BUSPAR Take 1 tablet (15 mg total) by mouth 3 (three) times daily.   estradiol 1 MG tablet Commonly known as: ESTRACE   fluticasone 50 MCG/ACT nasal spray Commonly known as: FLONASE Place 2 sprays into both nostrils daily.   medroxyPROGESTERone 2.5 MG tablet Commonly known as: PROVERA Take 2.5 mg by mouth daily.   naltrexone 50 MG tablet Commonly known as: DEPADE Take 1 tablet (50 mg total) by mouth daily.   propranolol 10 MG tablet Commonly known as: INDERAL TAKE 1 TO 2 TABLETS BY MOUTH TWICE DAILY AS NEEDED FOR ANXIETY   traZODone 50 MG tablet Commonly known as: DESYREL Take 0.5-1 tablets (25-50 mg total) by mouth at bedtime as needed for sleep. 1/2 tablet PRN   valACYclovir 500 MG tablet Commonly known as: VALTREX Take 500 mg by mouth 2 (two) times daily.       All past medical history, surgical history, allergies, family history, immunizations andmedications were updated in the EMR today and reviewed under the history and medication portions of their EMR.     ROS: Negative, with the exception of above mentioned in HPI   Objective:  BP (!) 142/93   Pulse 90   Temp 98.5 F (36.9 C) (Oral)   Ht 5\' 6"  (1.676 m)   Wt 130 lb (59 kg)   SpO2 96%   BMI 20.98 kg/m  Body mass index is 20.98 kg/m. Gen: Afebrile. No acute distress. Nontoxic in appearance, well developed, well nourished.  HENT: AT. [place]. Bilateral TM visualized without erythema or bulging.  External auditory canals within normal limits and no cerumen present.. MMM, no oral lesions.  Eyes:Pupils Equal Round Reactive to light, Extraocular movements intact,  Conjunctiva without redness, discharge or icterus. Neck/lymp/endocrine: Supple, no  lymphadenopathy Neuro: Normal gait. PERLA. EOMi. Alert. Oriented x3  Psych: Normal affect, dress and demeanor. Normal speech. Normal thought content and judgment.  Hearing Screening   125Hz  250Hz  500Hz  1000Hz  2000Hz  3000Hz  4000Hz  6000Hz  8000Hz   Right ear:   40 40 Fail  40    Left ear:   40 40 Fail  Fail        Hearing Screening   125Hz  250Hz  500Hz  1000Hz  2000Hz  3000Hz  4000Hz  6000Hz  8000Hz   Right ear:  40 40 Fail  40    Left ear:   40 40 Fail  Fail     No results found. No results found for this or any previous visit (from the past 24 hour(s)).  Assessment/Plan: Shamiah Kahler is a 60 y.o. female present for OV for  Generalized anxiety disorder Filled propranolol for her, she had to go without today and she does have an elevated pressure.  She has her CPE scheduled next week in which we will cover all of her chronic issues and recheck her blood pressure back on the propranolol. - propranolol (INDERAL) 10 MG tablet; TAKE 1 TO 2 TABLETS BY MOUTH TWICE DAILY AS NEEDED FOR ANXIETY  Dispense: 180 tablet; Refill: 1  Decreased hearing, unspecified laterality Rather significant decreased hearing bilaterally.  Discussed referral to audiology for formal hearing testing and consideration of hearing aids and she is agreeable to this approach today. - Ambulatory referral to Audiology   Reviewed expectations re: course of current medical issues.  Discussed self-management of symptoms.  Outlined signs and symptoms indicating need for more acute intervention.  Patient verbalized understanding and all questions were answered.  Patient received an After-Visit Summary.    Orders Placed This Encounter  Procedures  . Ambulatory referral to Audiology   Meds ordered this encounter  Medications  . propranolol (INDERAL) 10 MG tablet    Sig: TAKE 1 TO 2 TABLETS BY MOUTH TWICE DAILY AS NEEDED FOR ANXIETY    Dispense:  180 tablet    Refill:  1    Referral Orders     Ambulatory referral to  Audiology   Note is dictated utilizing voice recognition software. Although note has been proof read prior to signing, occasional typographical errors still can be missed. If any questions arise, please do not hesitate to call for verification.   electronically signed by:  Howard Pouch, DO  [place]

## 2020-12-12 ENCOUNTER — Encounter: Payer: Self-pay | Admitting: Family Medicine

## 2020-12-12 ENCOUNTER — Other Ambulatory Visit: Payer: Self-pay

## 2020-12-12 ENCOUNTER — Ambulatory Visit (INDEPENDENT_AMBULATORY_CARE_PROVIDER_SITE_OTHER): Payer: No Typology Code available for payment source | Admitting: Family Medicine

## 2020-12-12 VITALS — BP 110/76 | HR 84 | Temp 98.2°F | Ht 66.0 in | Wt 131.0 lb

## 2020-12-12 DIAGNOSIS — F410 Panic disorder [episodic paroxysmal anxiety] without agoraphobia: Secondary | ICD-10-CM

## 2020-12-12 DIAGNOSIS — Z131 Encounter for screening for diabetes mellitus: Secondary | ICD-10-CM | POA: Diagnosis not present

## 2020-12-12 DIAGNOSIS — E538 Deficiency of other specified B group vitamins: Secondary | ICD-10-CM

## 2020-12-12 DIAGNOSIS — F430 Acute stress reaction: Secondary | ICD-10-CM | POA: Diagnosis not present

## 2020-12-12 DIAGNOSIS — F4323 Adjustment disorder with mixed anxiety and depressed mood: Secondary | ICD-10-CM

## 2020-12-12 DIAGNOSIS — Z130 Encounter for screening for diseases of the blood and blood-forming organs and certain disorders involving the immune mechanism: Secondary | ICD-10-CM | POA: Diagnosis not present

## 2020-12-12 DIAGNOSIS — Z13220 Encounter for screening for lipoid disorders: Secondary | ICD-10-CM | POA: Diagnosis not present

## 2020-12-12 DIAGNOSIS — G479 Sleep disorder, unspecified: Secondary | ICD-10-CM

## 2020-12-12 DIAGNOSIS — R748 Abnormal levels of other serum enzymes: Secondary | ICD-10-CM | POA: Diagnosis not present

## 2020-12-12 DIAGNOSIS — Z1211 Encounter for screening for malignant neoplasm of colon: Secondary | ICD-10-CM

## 2020-12-12 DIAGNOSIS — Z0000 Encounter for general adult medical examination without abnormal findings: Secondary | ICD-10-CM

## 2020-12-12 MED ORDER — MIRTAZAPINE 15 MG PO TBDP: 15.0000 mg | ORAL_TABLET | Freq: Every day | ORAL | 5 refills | Status: DC

## 2020-12-12 MED ORDER — FLUTICASONE PROPIONATE 50 MCG/ACT NA SUSP: 2.0000 | Freq: Every day | NASAL | 6 refills | Status: DC

## 2020-12-12 NOTE — Progress Notes (Signed)
This visit occurred during the SARS-CoV-2 public health emergency.  Safety protocols were in place, including screening questions prior to the visit, additional usage of staff PPE, and extensive cleaning of exam room while observing appropriate contact time as indicated for disinfecting solutions.    Patient ID: Nayanna Seaborn, female  DOB: 1961-02-12, 60 y.o.   MRN: [id number] Patient Care Team    Relationship Specialty Notifications Start End  Ma Hillock, DO PCP - General Family Medicine  06/19/15     Chief Complaint  Patient presents with  . Annual Exam    Pt is not fasting     Subjective:  Devonda Pequignot is a 60 y.o.  Female  present for CPE. All past medical history, surgical history, allergies, family history, immunizations, medications and social history were updated in the electronic medical record today. All recent labs, ED visits and hospitalizations within the last year were reviewed.  Health maintenance:  Colonoscopy: completed 09/2010, by Deatra Ina- 10 yr Mammogram: completed:11/20/2020, [facility]  Cervical cancer screening: last pap: 09/2018, GYN Immunizations: tdap 2017, Influenza (encouraged yearly),zostavax series completed Infectious disease screening: HIV and  Hep C completed DEXA: consider screen at 62 Assistive device: none Oxygen YIF:OYDX Patient has a Dental home. Hospitalizations/ED visits: reviewed  Depression screen [facility] 2/9 12/12/2020 12/05/2020 04/22/2019 04/22/2019 11/17/2018  Decreased Interest 0 0 0 0 0  Down, Depressed, Hopeless 0 0 1 1 1   PHQ - 2 Score 0 0 1 1 1   Altered sleeping - - 2 - 0  Tired, decreased energy - - 1 - 1  Change in appetite - - 2 - 3  Feeling bad or failure about yourself  - - 1 - 0  Trouble concentrating - - 2 - 0  Moving slowly or fidgety/restless - - 0 - 0  Suicidal thoughts - - 0 - 0  PHQ-9 Score - - 9 - 5  Difficult doing work/chores - - Somewhat difficult - Not difficult at all   GAD 7 : Generalized Anxiety Score  09/14/2019 04/22/2019 11/17/2018  Nervous, Anxious, on Edge 3 2 1   Control/stop worrying 0 1 1  Worry too much - different things 3 0 1  Trouble relaxing 3 1 0  Restless 3 2 3   Easily annoyed or irritable 0 1 2  Afraid - awful might happen 0 0 0  Total GAD 7 Score 12 7 8   Anxiety Difficulty Very difficult Somewhat difficult Not difficult at all     Immunization History  Administered Date(s) Administered  . Influenza,inj,Quad PF,6+ Mos 05/27/2017, 05/12/2018  . Moderna Sars-Covid-2 Vaccination 03/09/2020, 04/06/2020  . Td 03/30/2016  . Zoster Recombinat (Shingrix) 03/05/2018, 07/03/2018   Past Medical History:  Diagnosis Date  . ADD (attention deficit disorder)    ? alcohol related  . Alcohol dependence, daily use ([place]) 11/17/2018  . Alcoholism ([place])    per record  . ANAL FISSURE, HX OF 04/15/2007   Annotation: occasional rectal bleeding Qualifier: Diagnosis of  By: Larose Kells MD, [place] deficiency   . Carpal tunnel syndrome   . Carpal tunnel syndrome 12/01/2009   Qualifier: Diagnosis of  By: Larose Kells MD, [place] COVID-19 virus infection 08/23/2019  . Diarrhea 11/17/2018  . Discoloration of skin of foot 05/20/2014   Purplish discoloration top of L foot   . Eustachian tube dysfunction 01/25/2014  . Mallet deformity of fifth finger, left, acquired 06/19/2015  . Suicidal ideation    Allergies  Allergen Reactions  .  Seroquel [Quetiapine Fumarate] Other (See Comments)    Increase sedation on low dose and leg weakness   Past Surgical History:  Procedure Laterality Date  . COLPOSCOPY  2020   pt reported benign.   Marland Kitchen FOOT SURGERY Left    Morton's neuroma, Dr Paulla Dolly  . RHINOPLASTY    . TONSILLECTOMY     Family History  Problem Relation Age of Onset  . Diabetes Other   . Hypertension Other   . Colon cancer Mother   . Breast cancer Mother   . Celiac disease Mother   . Pancreatic cancer Paternal Grandfather   . Anxiety disorder Sister   . ADD / ADHD Sister   . Bipolar  disorder Brother   . Pancreatic cancer Brother   . Bipolar disorder Son   . Alcoholism Father   . Lung cancer Paternal Grandmother    Social History   Social History Narrative   G5 P3, 3 miscarriages    Allergies as of 12/12/2020      Reactions   Seroquel [quetiapine Fumarate] Other (See Comments)   Increase sedation on low dose and leg weakness      Medication List       Accurate as of Dec 12, 2020  4:20 PM. If you have any questions, ask your nurse or doctor.        STOP taking these medications   bacitracin-polymyxin b ophthalmic ointment Commonly known as: POLYSPORIN Stopped by: Howard Pouch, DO   busPIRone 15 MG tablet Commonly known as: BUSPAR Stopped by: Howard Pouch, DO   naltrexone 50 MG tablet Commonly known as: DEPADE Stopped by: Howard Pouch, DO   traZODone 50 MG tablet Commonly known as: DESYREL Stopped by: Howard Pouch, DO     TAKE these medications   b complex vitamins capsule Take by mouth.   estradiol 1 MG tablet Commonly known as: ESTRACE   fluticasone 50 MCG/ACT nasal spray Commonly known as: FLONASE Place 2 sprays into both nostrils daily.   medroxyPROGESTERone 2.5 MG tablet Commonly known as: PROVERA Take 2.5 mg by mouth daily.   mirtazapine 15 MG disintegrating tablet Commonly known as: REMERON SOL-TAB Take 1 tablet (15 mg total) by mouth at bedtime. Started by: Howard Pouch, DO   propranolol 10 MG tablet Commonly known as: INDERAL TAKE 1 TO 2 TABLETS BY MOUTH TWICE DAILY AS NEEDED FOR ANXIETY   valACYclovir 500 MG tablet Commonly known as: VALTREX Take 500 mg by mouth 2 (two) times daily.       All past medical history, surgical history, allergies, family history, immunizations andmedications were updated in the EMR today and reviewed under the history and medication portions of their EMR.     Recent Results (from the past 2160 hour(s))  HM MAMMOGRAPHY     Status: None   Collection Time: 11/20/20 12:00 AM  Result  Value Ref Range   HM Mammogram 0-4 Bi-Rad 0-4 Bi-Rad, Self Reported Normal      ROS: 14 pt review of systems performed and negative (unless mentioned in an HPI)  Objective: BP 110/76   Pulse 84   Temp 98.2 F (36.8 C) (Oral)   Ht 5\' 6"  (1.676 m)   Wt 131 lb (59.4 kg)   SpO2 98%   BMI 21.14 kg/m  Gen: Afebrile. No acute distress. Nontoxic in appearance, well-developed, well-nourished, very pleasant female HENT: AT. [place]. Bilateral TM visualized and normal in appearance, normal external auditory canal. MMM, no oral lesions, adequate dentition. Bilateral nares within normal  limits. Throat without erythema, ulcerations or exudates.  No cough on exam, no hoarseness on exam. Eyes:Pupils Equal Round Reactive to light, Extraocular movements intact,  Conjunctiva without redness, discharge or icterus. Neck/lymp/endocrine: Supple, no lymphadenopathy, no thyromegaly CV: RRR no murmur, no edema, +2/4 P posterior tibialis pulses.  Chest: CTAB, no wheeze, rhonchi or crackles.  Normal respiratory effort.  Good air movement. Abd: Soft.  Flat. NTND. BS present.  No masses palpated. No hepatosplenomegaly. No rebound tenderness or guarding. Skin: No rashes, purpura or petechiae. Warm and well-perfused. Skin intact. Neuro/Msk: normal gait. PERLA. EOMi. Alert. Oriented x3.  Cranial nerves II through XII intact. Muscle strength 5/5 upper/lower extremity. DTRs equal bilaterally. Psych: Normal affect, dress and demeanor. Normal speech. Normal thought content and judgment.  No exam data present  Assessment/plan: Mariann Palo is a 60 y.o. female present for CPE/cmc Panic attack as reaction to stress/Adjustment disorder with mixed anxiety and depressed mood Continue propranolol 10 mg twice daily. - TSH B12 deficiency Neuropathy sensation bilateral lower feet.  - Vitamin B12 Elevated liver enzymes - Comprehensive metabolic panel Screening for deficiency anemia - CBC with Differential/Platelet Diabetes  mellitus screening - Hemoglobin A1c Lipid screening - Lipid panel Colon cancer screening - Ambulatory referral to Gastroenterology Sleep disturbance Discussed different options of treatment with her today.  Trazodone has not been effective for her.  Higher doses made her nauseous and lower doses were not effective. DC trazodone Start Remeron 15 mg nightly Follow-up in 5.5 months on chronic conditions Encounter for preventative health exam: Colonoscopy: completed 09/2010, by Deatra Ina- 10 yr Mammogram: completed:11/20/2020, [facility]  Cervical cancer screening: last pap: 09/2018, GYN Immunizations: tdap 2017, Influenza (encouraged yearly),zostavax series completed Infectious disease screening: HIV and  Hep C completed DEXA: consider screen at 62 Patient was encouraged to exercise greater than 150 minutes a week. Patient was encouraged to choose a diet filled with fresh fruits and vegetables, and lean meats. AVS provided to patient today for education/recommendation on gender specific health and safety maintenance. Return in about 5 months (around 05/28/2021) for [place] (30 min).   Orders Placed This Encounter  Procedures  . CBC with Differential/Platelet  . Vitamin B12  . Comprehensive metabolic panel  . Hemoglobin A1c  . Lipid panel  . TSH  . Ambulatory referral to Gastroenterology   Meds ordered this encounter  Medications  . fluticasone (FLONASE) 50 MCG/ACT nasal spray    Sig: Place 2 sprays into both nostrils daily.    Dispense:  16 g    Refill:  6  . mirtazapine (REMERON SOL-TAB) 15 MG disintegrating tablet    Sig: Take 1 tablet (15 mg total) by mouth at bedtime.    Dispense:  30 tablet    Refill:  5    Referral Orders     Ambulatory referral to Gastroenterology   Electronically signed by: Howard Pouch, DO [place]

## 2020-12-12 NOTE — Patient Instructions (Addendum)
Start Remeron Nightly. Follow in 5.5 mos  Health Maintenance, Female Adopting a healthy lifestyle and getting preventive care are important in promoting health and wellness. Ask your health care provider about:  The right schedule for you to have regular tests and exams.  Things you can do on your own to prevent diseases and keep yourself healthy. What should I know about diet, weight, and exercise? Eat a healthy diet  Eat a diet that includes plenty of vegetables, fruits, low-fat dairy products, and lean protein.  Do not eat a lot of foods that are high in solid fats, added sugars, or sodium.   Maintain a healthy weight Body mass index (BMI) is used to identify weight problems. It estimates body fat based on height and weight. Your health care provider can help determine your BMI and help you achieve or maintain a healthy weight. Get regular exercise Get regular exercise. This is one of the most important things you can do for your health. Most adults should:  Exercise for at least 150 minutes each week. The exercise should increase your heart rate and make you sweat (moderate-intensity exercise).  Do strengthening exercises at least twice a week. This is in addition to the moderate-intensity exercise.  Spend less time sitting. Even light physical activity can be beneficial. Watch cholesterol and blood lipids Have your blood tested for lipids and cholesterol at 58 years of age, then have this test every 5 years. Have your cholesterol levels checked more often if:  Your lipid or cholesterol levels are high.  You are older than 58 years of age.  You are at high risk for heart disease. What should I know about cancer screening? Depending on your health history and family history, you may need to have cancer screening at various ages. This may include screening for:  Breast cancer.  Cervical cancer.  Colorectal cancer.  Skin cancer.  Lung cancer. What should I know about  heart disease, diabetes, and high blood pressure? Blood pressure and heart disease  High blood pressure causes heart disease and increases the risk of stroke. This is more likely to develop in people who have high blood pressure readings, are of African descent, or are overweight.  Have your blood pressure checked: ? Every 3-5 years if you are 73-58 years of age. ? Every year if you are 58 years old or older. Diabetes Have regular diabetes screenings. This checks your fasting blood sugar level. Have the screening done:  Once every three years after age 10 if you are at a normal weight and have a low risk for diabetes.  More often and at a younger age if you are overweight or have a high risk for diabetes. What should I know about preventing infection? Hepatitis B If you have a higher risk for hepatitis B, you should be screened for this virus. Talk with your health care provider to find out if you are at risk for hepatitis B infection. Hepatitis C Testing is recommended for:  Everyone born from [birth year] through 1963.  Anyone with known risk factors for hepatitis C. Sexually transmitted infections (STIs)  Get screened for STIs, including gonorrhea and chlamydia, if: ? You are sexually active and are younger than 58 years of age. ? You are older than 58 years of age and your health care provider tells you that you are at risk for this type of infection. ? Your sexual activity has changed since you were last screened, and you are at increased risk for  chlamydia or gonorrhea. Ask your health care provider if you are at risk.  Ask your health care provider about whether you are at high risk for HIV. Your health care provider may recommend a prescription medicine to help prevent HIV infection. If you choose to take medicine to prevent HIV, you should first get tested for HIV. You should then be tested every 3 months for as long as you are taking the medicine. Pregnancy  If you are about to  stop having your period (premenopausal) and you may become pregnant, seek counseling before you get pregnant.  Take 400 to 800 micrograms (mcg) of folic acid every day if you become pregnant.  Ask for birth control (contraception) if you want to prevent pregnancy. Osteoporosis and menopause Osteoporosis is a disease in which the bones lose minerals and strength with aging. This can result in bone fractures. If you are 58 years old or older, or if you are at risk for osteoporosis and fractures, ask your health care provider if you should:  Be screened for bone loss.  Take a calcium or vitamin D supplement to lower your risk of fractures.  Be given hormone replacement therapy (HRT) to treat symptoms of menopause. Follow these instructions at home: Lifestyle  Do not use any products that contain nicotine or tobacco, such as cigarettes, e-cigarettes, and chewing tobacco. If you need help quitting, ask your health care provider.  Do not use street drugs.  Do not share needles.  Ask your health care provider for help if you need support or information about quitting drugs. Alcohol use  Do not drink alcohol if: ? Your health care provider tells you not to drink. ? You are pregnant, may be pregnant, or are planning to become pregnant.  If you drink alcohol: ? Limit how much you use to 0-1 drink a day. ? Limit intake if you are breastfeeding.  Be aware of how much alcohol is in your drink. In the [place] one drink equals one 12 oz bottle of beer (355 mL), one 5 oz glass of wine (148 mL), or one 1 oz glass of hard liquor (44 mL). General instructions  Schedule regular health, dental, and eye exams.  Stay current with your vaccines.  Tell your health care provider if: ? You often feel depressed. ? You have ever been abused or do not feel safe at home. Summary  Adopting a healthy lifestyle and getting preventive care are important in promoting health and wellness.  Follow your  health care provider's instructions about healthy diet, exercising, and getting tested or screened for diseases.  Follow your health care provider's instructions on monitoring your cholesterol and blood pressure. This information is not intended to replace advice given to you by your health care provider. Make sure you discuss any questions you have with your health care provider. Document Revised: 07/01/2018 Document Reviewed: 07/01/2018 Elsevier Patient Education  2021 [employer].

## 2020-12-13 LAB — HEMOGLOBIN A1C
Hgb A1c MFr Bld: 4.5 % of total Hgb (ref <5.7)
Mean Plasma Glucose: 82 mg/dL
eAG (mmol/L): 4.6 mmol/L

## 2020-12-13 LAB — COMPREHENSIVE METABOLIC PANEL
AG Ratio: 1.5 (calc) (ref 1.0–2.5)
ALT: 23 U/L (ref 6–29)
AST: 32 U/L (ref 10–35)
Albumin: 4.3 g/dL (ref 3.6–5.1)
Alkaline phosphatase (APISO): 44 U/L (ref 37–153)
BUN: 7 mg/dL (ref 7–25)
CO2: 25 mmol/L (ref 20–32)
Calcium: 9.7 mg/dL (ref 8.6–10.4)
Chloride: 103 mmol/L (ref 98–110)
Creat: 0.62 mg/dL (ref 0.50–1.05)
Globulin: 2.9 g/dL (calc) (ref 1.9–3.7)
Glucose, Bld: 85 mg/dL (ref 65–99)
Potassium: 4.1 mmol/L (ref 3.5–5.3)
Sodium: 139 mmol/L (ref 135–146)
Total Bilirubin: 0.6 mg/dL (ref 0.2–1.2)
Total Protein: 7.2 g/dL (ref 6.1–8.1)

## 2020-12-13 LAB — CBC WITH DIFFERENTIAL/PLATELET
Absolute Monocytes: 655 cells/uL (ref 200–950)
Basophils Absolute: 62 cells/uL (ref 0–200)
Basophils Relative: 1.2 %
Eosinophils Absolute: 192 cells/uL (ref 15–500)
Eosinophils Relative: 3.7 %
HCT: 40 % (ref 35.0–45.0)
Hemoglobin: 13.7 g/dL (ref 11.7–15.5)
Lymphs Abs: 1841 cells/uL (ref 850–3900)
MCH: 33.8 pg — ABNORMAL HIGH (ref 27.0–33.0)
MCHC: 34.3 g/dL (ref 32.0–36.0)
MCV: 98.8 fL (ref 80.0–100.0)
MPV: 10.1 fL (ref 7.5–12.5)
Monocytes Relative: 12.6 %
Neutro Abs: 2449 cells/uL (ref 1500–7800)
Neutrophils Relative %: 47.1 %
Platelets: 301 10*3/uL (ref 140–400)
RBC: 4.05 10*6/uL (ref 3.80–5.10)
RDW: 11.7 % (ref 11.0–15.0)
Total Lymphocyte: 35.4 %
WBC: 5.2 10*3/uL (ref 3.8–10.8)

## 2020-12-13 LAB — LIPID PANEL
Cholesterol: 167 mg/dL (ref <200)
HDL: 72 mg/dL (ref >=50)
LDL Cholesterol (Calc): 78 mg/dL (calc)
Non-HDL Cholesterol (Calc): 95 mg/dL (calc) (ref <130)
Total CHOL/HDL Ratio: 2.3 (calc) (ref <5.0)
Triglycerides: 87 mg/dL (ref <150)

## 2020-12-13 LAB — VITAMIN B12: Vitamin B-12: 300 pg/mL (ref 200–1100)

## 2020-12-13 LAB — TSH: TSH: 1.28 mIU/L (ref 0.40–4.50)

## 2022-01-09 LAB — HM MAMMOGRAPHY

## 2022-03-05 ENCOUNTER — Encounter: Payer: Self-pay | Admitting: Family Medicine

## 2022-03-05 ENCOUNTER — Ambulatory Visit (INDEPENDENT_AMBULATORY_CARE_PROVIDER_SITE_OTHER): Payer: PRIVATE HEALTH INSURANCE | Admitting: Family Medicine

## 2022-03-05 VITALS — BP 156/78 | HR 93 | Temp 98.2°F | Ht 66.0 in | Wt 139.0 lb

## 2022-03-05 DIAGNOSIS — R251 Tremor, unspecified: Secondary | ICD-10-CM | POA: Diagnosis not present

## 2022-03-05 DIAGNOSIS — G629 Polyneuropathy, unspecified: Secondary | ICD-10-CM | POA: Diagnosis not present

## 2022-03-05 DIAGNOSIS — F10288 Alcohol dependence with other alcohol-induced disorder: Secondary | ICD-10-CM | POA: Diagnosis not present

## 2022-03-05 DIAGNOSIS — R030 Elevated blood-pressure reading, without diagnosis of hypertension: Secondary | ICD-10-CM | POA: Diagnosis not present

## 2022-03-05 LAB — LIPID PANEL
Cholesterol: 189 mg/dL (ref 0–200)
HDL: 85.3 mg/dL (ref >39.00)
LDL Cholesterol: 91 mg/dL (ref 0–99)
NonHDL: 103.69
Total CHOL/HDL Ratio: 2
Triglycerides: 64 mg/dL (ref 0.0–149.0)
VLDL: 12.8 mg/dL (ref 0.0–40.0)

## 2022-03-05 LAB — COMPREHENSIVE METABOLIC PANEL
ALT: 21 U/L (ref 0–35)
AST: 30 U/L (ref 0–37)
Albumin: 4.4 g/dL (ref 3.5–5.2)
Alkaline Phosphatase: 55 U/L (ref 39–117)
BUN: 10 mg/dL (ref 6–23)
CO2: 25 mEq/L (ref 19–32)
Calcium: 9.6 mg/dL (ref 8.4–10.5)
Chloride: 102 mEq/L (ref 96–112)
Creatinine, Ser: 0.66 mg/dL (ref 0.40–1.20)
GFR: 96.48 mL/min (ref >60.00)
Glucose, Bld: 77 mg/dL (ref 70–99)
Potassium: 4.7 mEq/L (ref 3.5–5.1)
Sodium: 139 mEq/L (ref 135–145)
Total Bilirubin: 0.9 mg/dL (ref 0.2–1.2)
Total Protein: 7.2 g/dL (ref 6.0–8.3)

## 2022-03-05 LAB — TSH: TSH: 1.58 u[IU]/mL (ref 0.35–5.50)

## 2022-03-05 LAB — T4, FREE: Free T4: 0.72 ng/dL (ref 0.60–1.60)

## 2022-03-05 LAB — CBC WITH DIFFERENTIAL/PLATELET
Basophils Absolute: 0.1 10*3/uL (ref 0.0–0.1)
Basophils Relative: 1.5 % (ref 0.0–3.0)
Eosinophils Absolute: 0.2 10*3/uL (ref 0.0–0.7)
Eosinophils Relative: 2.6 % (ref 0.0–5.0)
HCT: 41.8 % (ref 36.0–46.0)
Hemoglobin: 13.8 g/dL (ref 12.0–15.0)
Lymphocytes Relative: 24.7 % (ref 12.0–46.0)
Lymphs Abs: 1.4 10*3/uL (ref 0.7–4.0)
MCHC: 33 g/dL (ref 30.0–36.0)
MCV: 100.7 fl — ABNORMAL HIGH (ref 78.0–100.0)
Monocytes Absolute: 0.8 10*3/uL (ref 0.1–1.0)
Monocytes Relative: 14.3 % — ABNORMAL HIGH (ref 3.0–12.0)
Neutro Abs: 3.3 10*3/uL (ref 1.4–7.7)
Neutrophils Relative %: 56.9 % (ref 43.0–77.0)
Platelets: 259 10*3/uL (ref 150.0–400.0)
RBC: 4.15 Mil/uL (ref 3.87–5.11)
RDW: 13.5 % (ref 11.5–15.5)
WBC: 5.9 10*3/uL (ref 4.0–10.5)

## 2022-03-05 LAB — B12 AND FOLATE PANEL
Folate: 15.7 ng/mL (ref >5.9)
Vitamin B-12: 628 pg/mL (ref 211–911)

## 2022-03-05 LAB — T3, FREE: T3, Free: 2.9 pg/mL (ref 2.3–4.2)

## 2022-03-05 LAB — HEMOGLOBIN A1C: Hgb A1c MFr Bld: 4.9 % (ref 4.6–6.5)

## 2022-03-05 MED ORDER — PROPRANOLOL HCL ER 80 MG PO CP24: 80.0000 mg | ORAL_CAPSULE | Freq: Every day | ORAL | 1 refills | Status: DC

## 2022-03-05 NOTE — Patient Instructions (Signed)
Tremor A tremor is trembling or shaking that you cannot control. Most tremors affect the hands or arms. Tremors can also affect the head, vocal cords, face, and other parts of the body. There are many types of tremors. Common types include: Essential tremor. These usually occur in people older than 40. This type of tremor may run in families and can happen in otherwise healthy people. Resting tremor. These occur when the muscles are at rest, such as when your hands are resting in your lap. People with Parkinson's disease often have resting tremors. Postural tremor. These occur when you try to hold a pose, such as keeping your hands outstretched. Kinetic tremor. These occur during purposeful movement, such as trying to touch a finger to your nose. Task-specific tremor. These may occur when you do certain tasks such as writing, speaking, or standing. Psychogenic tremor. These are greatly reduced or go away when you are distracted. These tremors happen due to underlying stress or psychiatric disease. They can happen in people of all ages. Some types of tremors have no known cause. Tremors can also be a symptom of nervous system problems (neurological disorders) that may occur with aging. Some tremors go away with treatment, while others do not. Follow these instructions at home: Lifestyle     If you drink alcohol: Limit how much you have to: 0-1 drink a day for women who are not pregnant. 0-2 drinks a day for men. Know how much alcohol is in a drink. In the [place] one drink equals one 12 oz bottle of beer (355 mL), one 5 oz glass of wine (148 mL), or one 1 oz glass of hard liquor (44 mL). Do not use any products that contain nicotine or tobacco. These products include cigarettes, chewing tobacco, and vaping devices, such as e-cigarettes. If you need help quitting, ask your health care provider. Avoid extreme heat and extreme cold. Limit your caffeine intake, as told by your health care  provider. Try to get 8 hours of sleep each night. Find ways to manage your stress, such as meditation or yoga. General instructions Take over-the-counter and prescription medicines only as told by your health care provider. Keep all follow-up visits. This is important. Contact a health care provider if: You develop a tremor after starting a new medicine. You have a tremor along with other symptoms such as: Numbness. Tingling. Pain. Weakness. Your tremor gets worse. Your tremor interferes with your day-to-day life. Summary A tremor is trembling or shaking that you cannot control. Most tremors affect the hands or arms. Some types of tremors have no known cause. Others may be a symptom of nervous system problems (neurological disorders). Make sure you discuss any tremors you have with your health care provider. This information is not intended to replace advice given to you by your health care provider. Make sure you discuss any questions you have with your health care provider. Document Revised: 04/27/2021 Document Reviewed: 04/27/2021 Elsevier Patient Education  2023 [employer] Inc[employer]  

## 2022-03-05 NOTE — Progress Notes (Signed)
Leah Jones , 08-13-1960, 61 y.o., female MRN: [id number] Patient Care Team    Relationship Specialty Notifications Start End  Ma Hillock, DO PCP - General Family Medicine  06/19/15   Obgyn, Erling Conte    03/05/22     Chief Complaint  Patient presents with   Tremors    Pt c/o intermittent tremors in hands and voice before meals x 2 mos;      Subjective: Pt presents for an OV with complaints of hand tremors, shaky voice and head movement involuntarily.  Patient states she has noticed it has become more noticeable over the last 2 months.  She feels maybe it improves after she eats.  She has a past medical history of anxiety, panic attack, B12 deficiency and alcohol dependence.  She is having numbness and tingling in her fingers and toes.  She has a known B12 deficiency.  She states that she gets back in the habit of taking her B vitamins it can improve the feeling in her extremities.  She had been able to cut back on her alcohol use in the past, but admits she is drinking alcohol daily again.     03/05/2022    9:03 AM 12/12/2020    3:08 PM 12/05/2020    1:13 PM 04/22/2019    8:34 AM 04/22/2019    8:32 AM  Depression screen PHQ 2/9  Decreased Interest 0 0 0 0 0  Down, Depressed, Hopeless 0 0 0 1 1  PHQ - 2 Score 0 0 0 1 1  Altered sleeping    2   Tired, decreased energy    1   Change in appetite    2   Feeling bad or failure about yourself     1   Trouble concentrating    2   Moving slowly or fidgety/restless    0   Suicidal thoughts    0   PHQ-9 Score    9   Difficult doing work/chores    Somewhat difficult     Allergies  Allergen Reactions   Seroquel [Quetiapine Fumarate] Other (See Comments)    Increase sedation on low dose and leg weakness   Social History   Social History Narrative   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 alcohol related   Alcohol dependence, daily use ([place]) 11/17/2018    Alcoholism ([place])    per record   [place], HX OF 04/15/2007   Annotation: occasional rectal bleeding Qualifier: Diagnosis of  By: Larose Kells MD, [place] deficiency    Carpal tunnel syndrome    Carpal tunnel syndrome 12/01/2009   Qualifier: Diagnosis of  By: Larose Kells MD, [place] E.    COVID-19 virus infection 08/23/2019   Diarrhea 11/17/2018   Discoloration of skin of foot 05/20/2014   Purplish discoloration top of L foot    Eustachian tube dysfunction 01/25/2014   Mallet deformity of fifth finger, left, acquired 06/19/2015   Suicidal ideation    Past Surgical History:  Procedure Laterality Date   COLPOSCOPY  2020   pt reported benign.    FOOT SURGERY Left    Morton's neuroma, Dr Paulla Dolly   RHINOPLASTY     TONSILLECTOMY     Family History  Problem Relation Age of Onset   Diabetes Other    Hypertension Other    Colon cancer Mother    Breast  cancer Mother    Celiac disease Mother    Pancreatic cancer Paternal Grandfather    Anxiety disorder Sister    ADD / ADHD Sister    Bipolar disorder Brother    Pancreatic cancer Brother    Bipolar disorder Son    Alcoholism Father    Lung cancer Paternal Grandmother    Allergies as of 03/05/2022       Reactions   Seroquel [quetiapine Fumarate] Other (See Comments)   Increase sedation on low dose and leg weakness        Medication List        Accurate as of March 05, 2022  9:52 AM. If you have any questions, ask your nurse or doctor.          STOP taking these medications    mirtazapine 15 MG disintegrating tablet Commonly known as: REMERON SOL-TAB Stopped by: Howard Pouch, DO   propranolol 10 MG tablet Commonly known as: INDERAL Replaced by: propranolol ER 80 MG 24 hr capsule Stopped by: Howard Pouch, DO       TAKE these medications    b complex vitamins capsule Take by mouth.   estradiol 1 MG tablet Commonly known as: ESTRACE   fluticasone 50 MCG/ACT nasal spray Commonly known as: FLONASE Place 2 sprays into  both nostrils daily.   medroxyPROGESTERone 2.5 MG tablet Commonly known as: PROVERA Take 2.5 mg by mouth daily.   propranolol ER 80 MG 24 hr capsule Commonly known as: Inderal LA Take 1 capsule (80 mg total) by mouth daily. Replaces: propranolol 10 MG tablet Started by: Howard Pouch, DO   valACYclovir 500 MG tablet Commonly known as: VALTREX Take 500 mg by mouth 2 (two) times daily.        All past medical history, surgical history, allergies, family history, immunizations andmedications were updated in the EMR today and reviewed under the history and medication portions of their EMR.     ROS Negative, with the exception of above mentioned in HPI   Objective:  BP (!) 156/78   Pulse 93   Temp 98.2 F (36.8 C) (Oral)   Ht 5' 6"  (1.676 m)   Wt 139 lb (63 kg)   LMP 04/18/2019 (Approximate)   SpO2 100%   BMI 22.44 kg/m  Body mass index is 22.44 kg/m. Physical Exam Vitals and nursing note reviewed.  Constitutional:      General: She is not in acute distress.    Appearance: Normal appearance. She is normal weight. She is not ill-appearing or toxic-appearing.  HENT:     Head: Normocephalic and atraumatic.  Eyes:     General:        Right eye: No discharge.        Left eye: No discharge.     Extraocular Movements: Extraocular movements intact.     Conjunctiva/sclera: Conjunctivae normal.     Pupils: Pupils are equal, round, and reactive to light.  Cardiovascular:     Rate and Rhythm: Normal rate and regular rhythm.     Heart sounds: No murmur heard. Pulmonary:     Effort: Pulmonary effort is normal.     Breath sounds: Normal breath sounds. No wheezing or rhonchi.  Musculoskeletal:     Cervical back: Neck supple.     Right lower leg: No edema.     Left lower leg: No edema.  Lymphadenopathy:     Cervical: No cervical adenopathy.  Skin:    Findings: No rash.  Neurological:  Mental Status: She is alert and oriented to person, place, and time. Mental status is  at baseline.     Motor: Tremor present.     Comments: Bilateral hand tremor with activity. Mild facial tremor surrounding mouth.  Psychiatric:        Mood and Affect: Mood normal.        Behavior: Behavior normal.        Thought Content: Thought content normal.        Judgment: Judgment normal.      No results found. No results found. No results found for this or any previous visit (from the past 24 hour(s)).  Assessment/Plan: Leah Jones is a 61 y.o. female present for OV for  Tremor Patient with worsening tremor of bilateral hands and facial tremor on exam today.  She reports she also feels her body shaking at times. We discussed different types of tremors today. She is very concerned she is developing Parkinson's disease. We discussed long-term alcohol dependence can also cause similar symptoms. - Ambulatory referral to Neurology - TSH - T4, free - T3, free - CBC w/Diff - Comp Met (CMET) - Hemoglobin A1c - Lipid panel - B12 and Folate Panel We will refer her to Dr. Carles Collet for movement disorder and start propranolol both to see if it is helpful for her tremor, provide her some anxiety coverage and blood pressure coverage.  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 Discussed self-management of symptoms. Outlined signs and symptoms indicating need for more acute intervention. Patient verbalized understanding and all questions were answered. Patient received an After-Visit Summary.    Orders  Placed This Encounter  Procedures   TSH   T4, free   T3, free   CBC w/Diff   Comp Met (CMET)   Hemoglobin A1c   Lipid panel   B12 and Folate Panel   Ambulatory referral to Neurology   Meds ordered this encounter  Medications   propranolol ER (INDERAL LA) 80 MG 24 hr capsule    Sig: Take 1 capsule (80 mg total) by mouth daily.    Dispense:  90 capsule    Refill:  1   Referral Orders         Ambulatory referral to Neurology       Note is dictated utilizing voice recognition software. Although note has been proof read prior to signing, occasional typographical errors still can be missed. If any questions arise, please do not hesitate to call for verification.   electronically signed by:  Howard Pouch, DO  [place]

## 2022-03-06 ENCOUNTER — Encounter: Payer: Self-pay | Admitting: Neurology

## 2022-03-19 ENCOUNTER — Other Ambulatory Visit: Payer: Self-pay | Admitting: Obstetrics and Gynecology

## 2022-03-20 ENCOUNTER — Other Ambulatory Visit: Payer: Self-pay | Admitting: Obstetrics and Gynecology

## 2022-03-20 DIAGNOSIS — E2839 Other primary ovarian failure: Secondary | ICD-10-CM

## 2022-03-21 ENCOUNTER — Ambulatory Visit (INDEPENDENT_AMBULATORY_CARE_PROVIDER_SITE_OTHER): Payer: PRIVATE HEALTH INSURANCE | Admitting: Family Medicine

## 2022-03-21 ENCOUNTER — Encounter: Payer: Self-pay | Admitting: Family Medicine

## 2022-03-21 VITALS — BP 143/89 | HR 74 | Temp 98.0°F | Ht 66.0 in | Wt 140.0 lb

## 2022-03-21 DIAGNOSIS — R251 Tremor, unspecified: Secondary | ICD-10-CM | POA: Insufficient documentation

## 2022-03-21 DIAGNOSIS — I10 Essential (primary) hypertension: Secondary | ICD-10-CM | POA: Diagnosis not present

## 2022-03-21 MED ORDER — LISINOPRIL 10 MG PO TABS: 10.0000 mg | ORAL_TABLET | Freq: Every day | ORAL | 5 refills | Status: DC

## 2022-03-21 NOTE — Progress Notes (Signed)
Leah Jones , 05/11/1961, 61 y.o., female MRN: [id number] Patient Care Team    Relationship Specialty Notifications Start End  Ma Hillock, DO PCP - General Family Medicine  06/19/15   Obgyn, Erling Conte    03/05/22     Chief Complaint  Patient presents with   Hypertension    Pt is not fasting;      Subjective: Leah Jones is a 61 y.o. female returns today for elevated blood pressures.  She states she was seen at her gynecologist a few days ago and had an elevated blood pressure at their office in the 295J systolic and she thinks 88C diastolic.  She is taking the Inderal 80 mg for her tremors and has noticed an improvement.  Some of the tremor is still present.  Her voice is still very shaky.  She does have a neurology establishment appointment for November.  Prior note Pt presents for an OV with complaints of hand tremors, shaky voice and head movement involuntarily.  Patient states she has noticed it has become more noticeable over the last 2 months.  She feels maybe it improves after she eats.  She has a past medical history of anxiety, panic attack, B12 deficiency and alcohol dependence.  She is having numbness and tingling in her fingers and toes.  She has a known B12 deficiency.  She states that she gets back in the habit of taking her B vitamins it can improve the feeling in her extremities.  She had been able to cut back on her alcohol use in the past, but admits she is drinking alcohol daily again.     03/05/2022    9:03 AM 12/12/2020    3:08 PM 12/05/2020    1:13 PM 04/22/2019    8:34 AM 04/22/2019    8:32 AM  Depression screen PHQ 2/9  Decreased Interest 0 0 0 0 0  Down, Depressed, Hopeless 0 0 0 1 1  PHQ - 2 Score 0 0 0 1 1  Altered sleeping    2   Tired, decreased energy    1   Change in appetite    2   Feeling bad or failure about yourself     1   Trouble concentrating    2   Moving slowly or fidgety/restless    0   Suicidal thoughts    0   PHQ-9 Score    9    Difficult doing work/chores    Somewhat difficult     Allergies  Allergen Reactions   Seroquel [Quetiapine Fumarate] Other (See Comments)    Increase sedation on low dose and leg weakness   Social History   Social History Narrative   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 alcohol related   Alcohol dependence, daily use ([place]) 11/17/2018   Alcoholism ([place])    per record   ANAL FISSURE, HX OF 04/15/2007   Annotation: occasional rectal bleeding Qualifier: Diagnosis of  By: Larose Kells MD, [place] deficiency    Carpal tunnel syndrome    Carpal tunnel syndrome 12/01/2009   Qualifier: Diagnosis of  By: Larose Kells MD, [place] E.    COVID-19 virus infection 08/23/2019   Diarrhea 11/17/2018   Discoloration of skin of foot 05/20/2014   Purplish discoloration top of L foot    Eustachian tube dysfunction 01/25/2014   Mallet deformity of fifth finger,  left, acquired 06/19/2015   Suicidal ideation    Past Surgical History:  Procedure Laterality Date   COLPOSCOPY  2020   pt reported benign.    FOOT SURGERY Left    Morton's neuroma, Dr Paulla Dolly   RHINOPLASTY     TONSILLECTOMY     Family History  Problem Relation Age of Onset   Diabetes Other    Hypertension Other    Colon cancer Mother    Breast cancer Mother    Celiac disease Mother    Pancreatic cancer Paternal Grandfather    Anxiety disorder Sister    ADD / ADHD Sister    Bipolar disorder Brother    Pancreatic cancer Brother    Bipolar disorder Son    Alcoholism Father    Lung cancer Paternal Grandmother    Allergies as of 03/21/2022       Reactions   Seroquel [quetiapine Fumarate] Other (See Comments)   Increase sedation on low dose and leg weakness        Medication List        Accurate as of March 21, 2022 10:53 AM. If you have any questions, ask your nurse or doctor.          b complex vitamins capsule Take by mouth.   estradiol 1 MG tablet Commonly  known as: ESTRACE   fluticasone 50 MCG/ACT nasal spray Commonly known as: FLONASE Place 2 sprays into both nostrils daily.   medroxyPROGESTERone 2.5 MG tablet Commonly known as: PROVERA Take 2.5 mg by mouth daily.   propranolol ER 80 MG 24 hr capsule Commonly known as: Inderal LA Take 1 capsule (80 mg total) by mouth daily.   valACYclovir 500 MG tablet Commonly known as: VALTREX Take 500 mg by mouth 2 (two) times daily.        All past medical history, surgical history, allergies, family history, immunizations andmedications were updated in the EMR today and reviewed under the history and medication portions of their EMR.     ROS Negative, with the exception of above mentioned in HPI   Objective:  BP (!) 143/89   Pulse 74   Temp 98 F (36.7 C) (Oral)   Ht '5\' 6"'$  (1.676 m)   Wt 140 lb (63.5 kg)   LMP 04/18/2019 (Approximate)   SpO2 100%   BMI 22.60 kg/m  Body mass index is 22.6 kg/m. Physical Exam Vitals and nursing note reviewed.  Constitutional:      General: She is not in acute distress.    Appearance: Normal appearance. She is normal weight. She is not ill-appearing or toxic-appearing.  HENT:     Head: Normocephalic and atraumatic.  Eyes:     General:        Right eye: No discharge.        Left eye: No discharge.     Extraocular Movements: Extraocular movements intact.     Conjunctiva/sclera: Conjunctivae normal.     Pupils: Pupils are equal, round, and reactive to light.  Cardiovascular:     Rate and Rhythm: Normal rate and regular rhythm.     Heart sounds: No murmur heard. Pulmonary:     Effort: Pulmonary effort is normal.     Breath sounds: Normal breath sounds.  Musculoskeletal:     Cervical back: Neck supple.     Right lower leg: No edema.     Left lower leg: No edema.  Lymphadenopathy:     Cervical: No cervical adenopathy.  Skin:    Findings:  No rash.  Neurological:     Mental Status: She is alert and oriented to person, place, and time.  Mental status is at baseline.     Motor: Tremor present.     Comments: Bilateral hand tremor with activity-some improvement today. Mild facial tremor surrounding mouth.  Psychiatric:        Mood and Affect: Mood normal.        Behavior: Behavior normal.        Thought Content: Thought content normal.        Judgment: Judgment normal.     No results found. No results found. No results found for this or any previous visit (from the past 24 hour(s)).  Assessment/Plan: Leah Jones is a 61 y.o. female present for OV for  Tremor Tremor has improved with propranolol 80 mg daily. She has establishment in November with movement disorder specialist. Continue propranolol 80 daily  hypertension Continue propranolol 80 ER daily Start lisinopril 10 mg daily Follow-up in 2 weeks already scheduled   Reviewed expectations re: course of current medical issues. Discussed self-management of symptoms. Outlined signs and symptoms indicating need for more acute intervention. Patient verbalized understanding and all questions were answered. Patient received an After-Visit Summary.    No orders of the defined types were placed in this encounter.  No orders of the defined types were placed in this encounter.  Referral Orders  No referral(s) requested today      Note is dictated utilizing voice recognition software. Although note has been proof read prior to signing, occasional typographical errors still can be missed. If any questions arise, please do not hesitate to call for verification.   electronically signed by:  Howard Pouch, DO  [place]

## 2022-03-21 NOTE — Patient Instructions (Signed)
Start lisinopril 10 mg a day.  Continue propanolol. Keep follow up in mid-september.

## 2022-04-02 ENCOUNTER — Ambulatory Visit: Payer: PRIVATE HEALTH INSURANCE | Admitting: Family Medicine

## 2022-04-08 ENCOUNTER — Encounter: Payer: Self-pay | Admitting: Family Medicine

## 2022-04-08 ENCOUNTER — Ambulatory Visit (INDEPENDENT_AMBULATORY_CARE_PROVIDER_SITE_OTHER): Payer: PRIVATE HEALTH INSURANCE | Admitting: Family Medicine

## 2022-04-08 VITALS — BP 125/80 | HR 77 | Temp 97.7°F | Ht 66.0 in | Wt 142.0 lb

## 2022-04-08 DIAGNOSIS — R251 Tremor, unspecified: Secondary | ICD-10-CM | POA: Diagnosis not present

## 2022-04-08 DIAGNOSIS — I10 Essential (primary) hypertension: Secondary | ICD-10-CM | POA: Diagnosis not present

## 2022-04-08 NOTE — Progress Notes (Signed)
Leah Jones , November 07, 1960, 61 y.o., female MRN: [id number] Patient Care Team    Relationship Specialty Notifications Start End  Ma Hillock, DO PCP - General Family Medicine  06/19/15   Obgyn, Erling Conte    03/05/22     Chief Complaint  Patient presents with   Hypertension    Cmc; pt is not fasting     Subjective: Leah Jones is a 61 y.o. female   Hypertension: Pt reports compliance with lisinopril 10 mg qd- started last visit.  Patient denies chest pain, shortness of breath or lower extremity edema.  Prior note: returns today for elevated blood pressures.  She states she was seen at her gynecologist a few days ago and had an elevated blood pressure at their office in the 643P systolic and she thinks 29J diastolic.  She is taking the Inderal 80 mg for her tremors and has noticed an improvement.  Some of the tremor is still present.  Her voice is still very shaky.  She does have a neurology establishment appointment for November.  Prior note Pt presents for an OV with complaints of hand tremors, shaky voice and head movement involuntarily.  Patient states she has noticed it has become more noticeable over the last 2 months.  She feels maybe it improves after she eats.  She has a past medical history of anxiety, panic attack, B12 deficiency and alcohol dependence.  She is having numbness and tingling in her fingers and toes.  She has a known B12 deficiency.  She states that she gets back in the habit of taking her B vitamins it can improve the feeling in her extremities.  She had been able to cut back on her alcohol use in the past, but admits she is drinking alcohol daily again.     03/05/2022    9:03 AM 12/12/2020    3:08 PM 12/05/2020    1:13 PM 04/22/2019    8:34 AM 04/22/2019    8:32 AM  Depression screen PHQ 2/9  Decreased Interest 0 0 0 0 0  Down, Depressed, Hopeless 0 0 0 1 1  PHQ - 2 Score 0 0 0 1 1  Altered sleeping    2   Tired, decreased energy    1   Change in  appetite    2   Feeling bad or failure about yourself     1   Trouble concentrating    2   Moving slowly or fidgety/restless    0   Suicidal thoughts    0   PHQ-9 Score    9   Difficult doing work/chores    Somewhat difficult     Allergies  Allergen Reactions   Seroquel [Quetiapine Fumarate] Other (See Comments)    Increase sedation on low dose and leg weakness   Social History   Social History Narrative   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 alcohol related   Alcohol dependence, daily use ([place]) 11/17/2018   Alcoholism ([place])    per record   ANAL FISSURE, HX OF 04/15/2007   Annotation: occasional rectal bleeding Qualifier: Diagnosis of  By: Larose Kells MD, [place] deficiency    Carpal tunnel syndrome    Carpal tunnel syndrome 12/01/2009   Qualifier: Diagnosis of  By: Larose Kells MD, [place] E.    COVID-19 virus infection 08/23/2019   Diarrhea 11/17/2018  Discoloration of skin of foot 05/20/2014   Purplish discoloration top of L foot    Eustachian tube dysfunction 01/25/2014   Mallet deformity of fifth finger, left, acquired 06/19/2015   Suicidal ideation    Past Surgical History:  Procedure Laterality Date   COLPOSCOPY  2020   pt reported benign.    FOOT SURGERY Left    Morton's neuroma, Dr Paulla Dolly   RHINOPLASTY     TONSILLECTOMY     Family History  Problem Relation Age of Onset   Diabetes Other    Hypertension Other    Colon cancer Mother    Breast cancer Mother    Celiac disease Mother    Pancreatic cancer Paternal Grandfather    Anxiety disorder Sister    ADD / ADHD Sister    Bipolar disorder Brother    Pancreatic cancer Brother    Bipolar disorder Son    Alcoholism Father    Lung cancer Paternal Grandmother    Allergies as of 04/08/2022       Reactions   Seroquel [quetiapine Fumarate] Other (See Comments)   Increase sedation on low dose and leg weakness        Medication List        Accurate  as of April 08, 2022  2:03 PM. If you have any questions, ask your nurse or doctor.          b complex vitamins capsule Take by mouth.   estradiol 1 MG tablet Commonly known as: ESTRACE   fluticasone 50 MCG/ACT nasal spray Commonly known as: FLONASE Place 2 sprays into both nostrils daily.   lisinopril 10 MG tablet Commonly known as: ZESTRIL Take 1 tablet (10 mg total) by mouth daily.   medroxyPROGESTERone 2.5 MG tablet Commonly known as: PROVERA Take 2.5 mg by mouth daily.   propranolol ER 80 MG 24 hr capsule Commonly known as: Inderal LA Take 1 capsule (80 mg total) by mouth daily.   valACYclovir 500 MG tablet Commonly known as: VALTREX Take 500 mg by mouth 2 (two) times daily.        All past medical history, surgical history, allergies, family history, immunizations andmedications were updated in the EMR today and reviewed under the history and medication portions of their EMR.     ROS Negative, with the exception of above mentioned in HPI   Objective:  BP 125/80   Pulse 77   Temp 97.7 F (36.5 C) (Oral)   Ht '5\' 6"'$  (1.676 m)   Wt 142 lb (64.4 kg)   LMP 04/18/2019 (Approximate)   SpO2 97%   BMI 22.92 kg/m  Body mass index is 22.92 kg/m. Physical Exam Vitals and nursing note reviewed.  Constitutional:      General: She is not in acute distress.    Appearance: Normal appearance. She is normal weight. She is not ill-appearing or toxic-appearing.  HENT:     Head: Normocephalic and atraumatic.  Eyes:     General:        Right eye: No discharge.        Left eye: No discharge.     Extraocular Movements: Extraocular movements intact.     Conjunctiva/sclera: Conjunctivae normal.     Pupils: Pupils are equal, round, and reactive to light.  Cardiovascular:     Rate and Rhythm: Normal rate and regular rhythm.     Heart sounds: No murmur heard. Pulmonary:     Effort: Pulmonary effort is normal.     Breath sounds:  Normal breath sounds.   Musculoskeletal:     Cervical back: Neck supple.     Right lower leg: No edema.     Left lower leg: No edema.  Lymphadenopathy:     Cervical: No cervical adenopathy.  Skin:    Findings: No rash.  Neurological:     Mental Status: She is alert and oriented to person, place, and time. Mental status is at baseline.     Motor: Tremor present.  Psychiatric:        Mood and Affect: Mood normal.        Behavior: Behavior normal.        Thought Content: Thought content normal.        Judgment: Judgment normal.     No results found. No results found. No results found for this or any previous visit (from the past 24 hour(s)).  Assessment/Plan: Chris Narasimhan is a 61 y.o. female present for OV for  Tremor Tremor has improved with propranolol 80 mg daily. She has establishment in November with movement disorder specialist. Continue propranolol 80 daily  Hypertension stable Continue propranolol 80 ER daily Continue  lisinopril 10 mg daily    Reviewed expectations re: course of current medical issues. Discussed self-management of symptoms. Outlined signs and symptoms indicating need for more acute intervention. Patient verbalized understanding and all questions were answered. Patient received an After-Visit Summary.    No orders of the defined types were placed in this encounter.  No orders of the defined types were placed in this encounter.  Referral Orders  No referral(s) requested today      Note is dictated utilizing voice recognition software. Although note has been proof read prior to signing, occasional typographical errors still can be missed. If any questions arise, please do not hesitate to call for verification.   electronically signed by:  Howard Pouch, DO  [place]

## 2022-04-08 NOTE — Patient Instructions (Addendum)
Return in about 24 weeks (around 09/23/2022) for Routine chronic condition follow-up.        Great to see you today.  I have refilled the medication(s) we provide.   If labs were collected, we will inform you of lab results once received either by echart message or telephone call.   - echart message- for normal results that have been seen by the patient already.   - telephone call: abnormal results or if patient has not viewed results in their echart.

## 2022-04-17 ENCOUNTER — Ambulatory Visit: Payer: PRIVATE HEALTH INSURANCE | Admitting: Neurology

## 2022-05-24 NOTE — Progress Notes (Deleted)
Assessment/Plan:   1.  Essential Tremor.  -This is evidenced by the symmetrical nature and longstanding hx of gradually getting worse.  We discussed nature and pathophysiology.  We discussed that this can continue to gradually get worse with time.  We discussed that some medications can worsen this, as can caffeine use.  We discussed medication therapy as well as surgical therapy.  Ultimately, the patient decided to ***.    -Long discussion with the patient regarding the complex effect that alcohol can have on tremor.  Chronic alcohol use can produce a tremor but discontinuation of the alcohol can also cause a tremulous state for quite some time.  We talked about the importance of weaning alcohol under medical supervision, which has been discussed by primary care as well.  We talked about the fact that rapid discontinuation can cause withdrawal seizure, which is why medical supervision is of the upmost importance.      Subjective:   Leah Jones was seen in consultation in the movement disorder clinic at the request of Kuneff, [place], DO.  The evaluation is for tremor.  PCP notes indicate tremor started 2 months ago in hand and mouth.  Pt with hx of alcohol use/overuse.  Pcp notes indicate that pt given Inderal LA 80 mg with some success.  Tremor started approximately *** ago and involves the ***.  Tremor is most noticeable when ***.   There is *** family hx of tremor.    Affected by caffeine:  {yes no:314532} Affected by alcohol:  {yes no:314532} Affected by stress:  {yes no:314532} Affected by fatigue:  {yes no:314532} Spills soup if on spoon:  {yes no:314532} Spills glass of liquid if full:  {yes no:314532} Affects ADL's (tying shoes, brushing teeth, etc):  {yes no:314532}  Current/Previously tried tremor medications: ***  Current medications that may exacerbate tremor:  ***  Outside reports reviewed: {Outside review:15817}.  Allergies  Allergen Reactions   Seroquel [Quetiapine  Fumarate] Other (See Comments)    Increase sedation on low dose and leg weakness    No outpatient medications have been marked as taking for the 05/27/22 encounter (Appointment) with Nivedita Mirabella, Eustace Quail, DO.      Objective:   VITALS:  There were no vitals filed for this visit. Gen:  Appears stated age and in NAD. HEENT:  Normocephalic, atraumatic. The mucous membranes are moist. The superficial temporal arteries are without ropiness or tenderness. Cardiovascular: Regular rate and rhythm. Lungs: Clear to auscultation bilaterally. Neck: There are no carotid bruits noted bilaterally.  NEUROLOGICAL:  Orientation:  The patient is alert and oriented x 3.   Cranial nerves: There is good facial symmetry. Extraocular muscles are intact and visual fields are full to confrontational testing. Speech is fluent and clear. Soft palate rises symmetrically and there is no tongue deviation. Hearing is intact to conversational tone. Tone: Tone is good throughout. Sensation: Sensation is intact to light touch touch throughout (facial, trunk, extremities). Vibration is intact at the bilateral big toe. There is no extinction with double simultaneous stimulation. There is no sensory dermatomal level identified. Coordination:  The patient has no dysdiadichokinesia or dysmetria. Motor: Strength is 5/5 in the bilateral upper and lower extremities.  Shoulder shrug is equal bilaterally.  There is no pronator drift.  There are no fasciculations noted. DTR's: Deep tendon reflexes are 2/4 at the bilateral biceps, triceps, brachioradialis, patella and achilles.  Plantar responses are downgoing bilaterally. Gait and Station: The patient is able to ambulate without difficulty. The patient is  able to heel toe walk without any difficulty. The patient is able to ambulate in a tandem fashion. The patient is able to stand in the Romberg position.   MOVEMENT EXAM: Tremor:  There is *** tremor in the UE, noted most significantly  with action.  The patient is *** able to draw Archimedes spirals without significant difficulty.  There is *** tremor at rest.  The patient is *** able to pour water from one glass to another without spilling it.  I have reviewed and interpreted the following labs independently   Chemistry      Component Value Date/Time   NA 139 03/05/2022 0929   NA 139 10/07/2018 0000   K 4.7 03/05/2022 0929   CL 102 03/05/2022 0929   CO2 25 03/05/2022 0929   BUN 10 03/05/2022 0929   BUN 8 05/21/2019 0000   CREATININE 0.66 03/05/2022 0929   CREATININE 0.62 12/12/2020 1545   GLU 99 10/07/2018 0000      Component Value Date/Time   CALCIUM 9.6 03/05/2022 0929   ALKPHOS 55 03/05/2022 0929   AST 30 03/05/2022 0929   ALT 21 03/05/2022 0929   BILITOT 0.9 03/05/2022 0929      Lab Results  Component Value Date   WBC 5.9 03/05/2022   HGB 13.8 03/05/2022   HCT 41.8 03/05/2022   MCV 100.7 (H) 03/05/2022   PLT 259.0 03/05/2022   Lab Results  Component Value Date   TSH 1.58 03/05/2022      Total time spent on today's visit was ***60 minutes, including both face-to-face time and nonface-to-face time.  Time included that spent on review of records (prior notes available to me/labs/imaging if pertinent), discussing treatment and goals, answering patient's questions and coordinating care.  CC:  Kuneff, Renee A, DO

## 2022-05-27 ENCOUNTER — Ambulatory Visit: Payer: PRIVATE HEALTH INSURANCE | Admitting: Neurology

## 2022-07-10 ENCOUNTER — Ambulatory Visit: Payer: PRIVATE HEALTH INSURANCE | Admitting: Neurology

## 2022-08-19 NOTE — Progress Notes (Deleted)
Assessment/Plan:   Tremor  -Long discussion with the patient regarding the complex effect that alcohol can have on tremor.  Chronic alcohol use can produce a tremor but discontinuation of the alcohol can also cause a tremulous state for quite some time.  We talked about the importance of weaning alcohol under medical supervision.  We talked about the fact that rapid discontinuation can cause withdrawal seizure, which is why medical supervision is of the upmost importance.      Subjective:   Leah Jones was seen in consultation in the movement disorder clinic at the request of Kuneff, Obetz, DO.  The evaluation is for tremor.  Referral was made several months ago, but there have been multiple cancellations, so today was the first time I have seen this patient. Pt notes indicate tremor started 5 months ago in hand and mouth.  Pt with hx of alcohol use/overuse.  Pcp notes indicate that pt given Inderal LA 80 mg with some success.    Tremor is most noticeable when ***.   There is *** family hx of tremor.    Affected by caffeine:  {yes no:314532} Affected by alcohol:  {yes no:314532} Affected by stress:  {yes no:314532} Affected by fatigue:  {yes no:314532} Spills soup if on spoon:  {yes no:314532} Spills glass of liquid if full:  {yes no:314532} Affects ADL's (tying shoes, brushing teeth, etc):  {yes no:314532}  Current/Previously tried tremor medications: ***  Current medications that may exacerbate tremor:  ***  Outside reports reviewed: {Outside review:15817}.  Allergies  Allergen Reactions   Seroquel [Quetiapine Fumarate] Other (See Comments)    Increase sedation on low dose and leg weakness    No outpatient medications have been marked as taking for the 08/20/22 encounter (Appointment) with Matheau Orona, Eustace Quail, DO.      Objective:   VITALS:  There were no vitals filed for this visit. Gen:  Appears stated age and in NAD. HEENT:  Normocephalic, atraumatic. The mucous membranes  are moist. The superficial temporal arteries are without ropiness or tenderness. Cardiovascular: Regular rate and rhythm. Lungs: Clear to auscultation bilaterally. Neck: There are no carotid bruits noted bilaterally.  NEUROLOGICAL:  Orientation:  The patient is alert and oriented x 3.   Cranial nerves: There is good facial symmetry. Extraocular muscles are intact and visual fields are full to confrontational testing. Speech is fluent and clear. Soft palate rises symmetrically and there is no tongue deviation. Hearing is intact to conversational tone. Tone: Tone is good throughout. Sensation: Sensation is intact to light touch touch throughout (facial, trunk, extremities). Vibration is intact at the bilateral big toe. There is no extinction with double simultaneous stimulation. There is no sensory dermatomal level identified. Coordination:  The patient has no dysdiadichokinesia or dysmetria. Motor: Strength is 5/5 in the bilateral upper and lower extremities.  Shoulder shrug is equal bilaterally.  There is no pronator drift.  There are no fasciculations noted. DTR's: Deep tendon reflexes are 2/4 at the bilateral biceps, triceps, brachioradialis, patella and achilles.  Plantar responses are downgoing bilaterally. Gait and Station: The patient is able to ambulate without difficulty. The patient is able to heel toe walk without any difficulty. The patient is able to ambulate in a tandem fashion. The patient is able to stand in the Romberg position.   MOVEMENT EXAM: Tremor:  There is *** tremor in the UE, noted most significantly with action.  The patient is *** able to draw Archimedes spirals without significant difficulty.  There is ***  tremor at rest.  The patient is *** able to pour water from one glass to another without spilling it.  I have reviewed and interpreted the following labs independently   Chemistry      Component Value Date/Time   NA 139 03/05/2022 0929   NA 139 10/07/2018 0000    K 4.7 03/05/2022 0929   CL 102 03/05/2022 0929   CO2 25 03/05/2022 0929   BUN 10 03/05/2022 0929   BUN 8 05/21/2019 0000   CREATININE 0.66 03/05/2022 0929   CREATININE 0.62 12/12/2020 1545   GLU 99 10/07/2018 0000      Component Value Date/Time   CALCIUM 9.6 03/05/2022 0929   ALKPHOS 55 03/05/2022 0929   AST 30 03/05/2022 0929   ALT 21 03/05/2022 0929   BILITOT 0.9 03/05/2022 0929      Lab Results  Component Value Date   WBC 5.9 03/05/2022   HGB 13.8 03/05/2022   HCT 41.8 03/05/2022   MCV 100.7 (H) 03/05/2022   PLT 259.0 03/05/2022   Lab Results  Component Value Date   TSH 1.58 03/05/2022      Total time spent on today's visit was ***60 minutes, including both face-to-face time and nonface-to-face time.  Time included that spent on review of records (prior notes available to me/labs/imaging if pertinent), discussing treatment and goals, answering patient's questions and coordinating care.  CC:  Kuneff, Renee A, DO

## 2022-08-20 ENCOUNTER — Ambulatory Visit: Payer: PRIVATE HEALTH INSURANCE | Admitting: Neurology

## 2022-08-23 ENCOUNTER — Encounter (HOSPITAL_COMMUNITY): Payer: Self-pay

## 2022-08-23 ENCOUNTER — Emergency Department (HOSPITAL_COMMUNITY)
Admission: EM | Admit: 2022-08-23 | Discharge: 2022-08-24 | Disposition: A | Discharge status: Home / Self Care | Payer: PRIVATE HEALTH INSURANCE | Attending: Emergency Medicine | Admitting: Emergency Medicine

## 2022-08-23 ENCOUNTER — Other Ambulatory Visit: Payer: Self-pay

## 2022-08-23 DIAGNOSIS — T7840XA Allergy, unspecified, initial encounter: Secondary | ICD-10-CM | POA: Diagnosis present

## 2022-08-23 DIAGNOSIS — T782XXA Anaphylactic shock, unspecified, initial encounter: Secondary | ICD-10-CM | POA: Diagnosis not present

## 2022-08-23 LAB — I-STAT CHEM 8, ED
BUN: 6 mg/dL (ref 6–20)
Calcium, Ion: 1.07 mmol/L — ABNORMAL LOW (ref 1.15–1.40)
Chloride: 100 mmol/L (ref 98–111)
Creatinine, Ser: 0.8 mg/dL (ref 0.44–1.00)
Glucose, Bld: 131 mg/dL — ABNORMAL HIGH (ref 70–99)
HCT: 48 % — ABNORMAL HIGH (ref 36.0–46.0)
Hemoglobin: 16.3 g/dL — ABNORMAL HIGH (ref 12.0–15.0)
Potassium: 4.4 mmol/L (ref 3.5–5.1)
Sodium: 135 mmol/L (ref 135–145)
TCO2: 21 mmol/L — ABNORMAL LOW (ref 22–32)

## 2022-08-23 LAB — CBC WITH DIFFERENTIAL/PLATELET
Abs Immature Granulocytes: 0.07 10*3/uL (ref 0.00–0.07)
Basophils Absolute: 0.1 10*3/uL (ref 0.0–0.1)
Basophils Relative: 0 %
Eosinophils Absolute: 0.1 10*3/uL (ref 0.0–0.5)
Eosinophils Relative: 0 %
HCT: 46.4 % — ABNORMAL HIGH (ref 36.0–46.0)
Hemoglobin: 15.7 g/dL — ABNORMAL HIGH (ref 12.0–15.0)
Immature Granulocytes: 1 %
Lymphocytes Relative: 13 %
Lymphs Abs: 1.8 10*3/uL (ref 0.7–4.0)
MCH: 33.3 pg (ref 26.0–34.0)
MCHC: 33.8 g/dL (ref 30.0–36.0)
MCV: 98.5 fL (ref 80.0–100.0)
Monocytes Absolute: 0.5 10*3/uL (ref 0.1–1.0)
Monocytes Relative: 4 %
Neutro Abs: 10.9 10*3/uL — ABNORMAL HIGH (ref 1.7–7.7)
Neutrophils Relative %: 82 %
Platelets: 366 10*3/uL (ref 150–400)
RBC: 4.71 MIL/uL (ref 3.87–5.11)
RDW: 12.1 % (ref 11.5–15.5)
WBC: 13.3 10*3/uL — ABNORMAL HIGH (ref 4.0–10.5)
nRBC: 0 % (ref 0.0–0.2)

## 2022-08-23 MED ORDER — PREDNISONE 10 MG PO TABS: 50.0000 mg | ORAL_TABLET | Freq: Every day | ORAL | 0 refills | Status: DC

## 2022-08-23 MED ORDER — ONDANSETRON HCL 4 MG/2ML IJ SOLN
4.0000 mg | Freq: Once | INTRAMUSCULAR | Status: AC
  Administered 2022-08-23: 4 mg via INTRAVENOUS
  Filled 2022-08-23: qty 2

## 2022-08-23 MED ORDER — EPINEPHRINE 0.1 MG/10ML (10 MCG/ML) SYRINGE FOR IV PUSH (FOR BLOOD PRESSURE SUPPORT): 6.0000 ug | PREFILLED_SYRINGE | Freq: Once | INTRAVENOUS | Status: DC | PRN

## 2022-08-23 MED ORDER — EPINEPHRINE 0.3 MG/0.3ML IJ SOAJ
0.3000 mg | Freq: Once | INTRAMUSCULAR | Status: AC
  Administered 2022-08-23: 0.3 mg via INTRAMUSCULAR
  Filled 2022-08-23: qty 0.3

## 2022-08-23 MED ORDER — EPINEPHRINE HCL 5 MG/250ML IV SOLN IN NS
0.5000 ug/min | INTRAVENOUS | Status: DC
  Administered 2022-08-23: 0.5 ug/min via INTRAVENOUS
  Filled 2022-08-23: qty 250

## 2022-08-23 MED ORDER — IPRATROPIUM-ALBUTEROL 0.5-2.5 (3) MG/3ML IN SOLN
3.0000 mL | Freq: Once | RESPIRATORY_TRACT | Status: AC
  Administered 2022-08-23: 3 mL via RESPIRATORY_TRACT
  Filled 2022-08-23: qty 3

## 2022-08-23 MED ORDER — SODIUM CHLORIDE 0.9 % IV SOLN: INTRAVENOUS | Status: DC

## 2022-08-23 MED ORDER — SODIUM CHLORIDE 0.9 % IV BOLUS
1000.0000 mL | Freq: Once | INTRAVENOUS | Status: AC
  Administered 2022-08-23: 1000 mL via INTRAVENOUS

## 2022-08-23 MED ORDER — EPINEPHRINE 0.3 MG/0.3ML IJ SOAJ: 0.3000 mg | INTRAMUSCULAR | 1 refills | Status: AC | PRN

## 2022-08-23 MED ORDER — DIPHENHYDRAMINE HCL 50 MG/ML IJ SOLN: 25.0000 mg | Freq: Once | INTRAMUSCULAR | Status: DC

## 2022-08-23 MED ORDER — METHYLPREDNISOLONE SODIUM SUCC 125 MG IJ SOLR
INTRAMUSCULAR | Status: AC
  Administered 2022-08-23: 125 mg via INTRAVENOUS
  Filled 2022-08-23: qty 2

## 2022-08-23 MED ORDER — EPINEPHRINE 0.3 MG/0.3ML IJ SOAJ: 0.3000 mg | INTRAMUSCULAR | 0 refills | Status: DC | PRN

## 2022-08-23 MED ORDER — METHYLPREDNISOLONE SODIUM SUCC 125 MG IJ SOLR: 125.0000 mg | Freq: Once | INTRAMUSCULAR | Status: AC

## 2022-08-23 MED ORDER — FAMOTIDINE IN NACL 20-0.9 MG/50ML-% IV SOLN
20.0000 mg | Freq: Once | INTRAVENOUS | Status: AC
  Administered 2022-08-23: 20 mg via INTRAVENOUS
  Filled 2022-08-23: qty 50

## 2022-08-23 MED ORDER — DIPHENHYDRAMINE HCL 25 MG PO CAPS: 25.0000 mg | ORAL_CAPSULE | Freq: Four times a day (QID) | ORAL | 0 refills | Status: DC | PRN

## 2022-08-23 NOTE — Discharge Instructions (Signed)
We saw you in the ER after you had the allergic reaction.  The reaction is severe, however, it appears to be in control and there is no increased swelling or any difficulty in breathing noted. We are not sure what caused the reaction, and it is important for you to follow up with an allergist. Please take the medications prescribed. PLEASE RETURN TO THE ER IMMEDIATELY IN CASE YOU START HAVING WORSENING SWELLING, DIFFICULTY IN BREATHING ETC.

## 2022-08-23 NOTE — ED Provider Notes (Signed)
Care assumed from Dr. Kathrynn Humble, patient with anaphylaxis, now being observed off all medications. Plan is for discharge if doing well at [place].  Patient reevaluated, she is awake and alert with normal vital signs.  She is complaining of some cramping and nausea and is requesting a dose of diphenhydramine.  In addition to medications prescribed by Dr. Kathrynn Humble, I am adding ondansetron oral dissolving tablet, pantoprazole, and daily cetirizine.  I have ordered a dose of diphenhydramine and pantoprazole prior to discharge.   Delora Fuel, MD 74/25/95 0040

## 2022-08-23 NOTE — ED Triage Notes (Signed)
Pt arriving via EMS with c/o allergic reaction. Patient reports she received a flu shot around 1300 today. Patient reports she took first dose of Bactrim tablets today around 1600. Patient then reports symptoms of hives, swelling on face, incontinence, and n/v starting around 1630. Patient reports that she has taken bactrim tablets in the past (last time about 1.5 years ago). Patient states no prior reactions to vaccinations.  0.3 mg epinephrine IM and 50 mg benadryl PO before EMS arrival.  Meds given with EMS: Epinephrine drip initiated ('1mg'$  in 250cc) '4mg'$  zofran IV '5mg'$  albuterol 500cc NS

## 2022-08-23 NOTE — ED Notes (Signed)
This RN assisted patient in ambulating to restroom.

## 2022-08-24 DIAGNOSIS — T782XXA Anaphylactic shock, unspecified, initial encounter: Secondary | ICD-10-CM | POA: Diagnosis not present

## 2022-08-24 MED ORDER — PANTOPRAZOLE SODIUM 40 MG IV SOLR
40.0000 mg | Freq: Once | INTRAVENOUS | Status: AC
  Administered 2022-08-24: 40 mg via INTRAVENOUS
  Filled 2022-08-24: qty 10

## 2022-08-24 MED ORDER — CETIRIZINE-PSEUDOEPHEDRINE ER 5-120 MG PO TB12: 1.0000 | ORAL_TABLET | Freq: Every day | ORAL | 0 refills | Status: DC

## 2022-08-24 MED ORDER — DIPHENHYDRAMINE HCL 50 MG/ML IJ SOLN
25.0000 mg | Freq: Once | INTRAMUSCULAR | Status: AC
  Administered 2022-08-24: 25 mg via INTRAVENOUS
  Filled 2022-08-24: qty 1

## 2022-08-24 MED ORDER — ONDANSETRON 8 MG PO TBDP: 8.0000 mg | ORAL_TABLET | Freq: Three times a day (TID) | ORAL | 0 refills | Status: DC | PRN

## 2022-08-24 MED ORDER — PANTOPRAZOLE SODIUM 40 MG PO TBEC: 40.0000 mg | DELAYED_RELEASE_TABLET | Freq: Every day | ORAL | 0 refills | Status: DC

## 2022-08-26 ENCOUNTER — Telehealth: Payer: Self-pay

## 2022-08-26 NOTE — Telephone Encounter (Signed)
Transition Care Management Unsuccessful Follow-up Telephone Call  Date of discharge and from where:  [facility] Emergency Department at [facility] 08/26/22  Attempts:  1st Attempt  Reason for unsuccessful TCM follow-up call:  Left voice message

## 2022-08-28 ENCOUNTER — Other Ambulatory Visit: Payer: Self-pay | Admitting: Family Medicine

## 2022-09-01 NOTE — ED Provider Notes (Signed)
[place] EMERGENCY DEPARTMENT AT [facility] Provider Note   CSN: [id number] Arrival date & time: 08/23/22  1734     History  Chief Complaint  Patient presents with   Allergic Reaction    Leah Jones is a 62 y.o. female.  HPI    62 year old female comes in with chief complaint of allergic reaction. Patient reports that she received a flu shot around 1 PM.  About 4:00, she took her first dose of Bactrim.  About 30 minutes after that she started noticing hives, swelling to her face, nausea and vomiting.  According to EMS, the right patient's face was puffy, and it appeared that her lip was swollen as well.  Patient received epinephrine per EMS along with Benadryl.  Patient has no known history of severe allergies.  She has taken Bactrim in the past without complications.  Home Medications Prior to Admission medications   Medication Sig Start Date End Date Taking? Authorizing Provider  cetirizine-pseudoephedrine (ZYRTEC-D) 5-120 MG tablet Take 1 tablet by mouth daily. [id number]  Yes Delora Fuel, MD  diphenhydrAMINE (BENADRYL) 25 mg capsule Take 1 capsule (25 mg total) by mouth every 6 (six) hours as needed for itching. 08/23/22  Yes Usman Millett, MD  ondansetron (ZOFRAN-ODT) 8 MG disintegrating tablet Take 1 tablet (8 mg total) by mouth every 8 (eight) hours as needed for nausea or vomiting. [id number]  Yes Delora Fuel, MD  pantoprazole (PROTONIX) 40 MG tablet Take 1 tablet (40 mg total) by mouth daily. [id number]  Yes Delora Fuel, MD  predniSONE (DELTASONE) 10 MG tablet Take 5 tablets (50 mg total) by mouth daily. 08/23/22  Yes Varney Biles, MD  b complex vitamins capsule Take by mouth.    [provider]  EPINEPHrine 0.3 mg/0.3 mL IJ SOAJ injection Inject 0.3 mg into the muscle as needed for anaphylaxis. 08/23/22   Varney Biles, MD  estradiol (ESTRACE) 1 MG tablet Take 1 mg by mouth daily. 09/15/18   [provider]  fluticasone (FLONASE) 50 MCG/ACT nasal  spray Place 2 sprays into both nostrils daily. 12/12/20   Kuneff, Renee A, DO  lisinopril (ZESTRIL) 10 MG tablet Take 1 tablet (10 mg total) by mouth daily. 03/21/22   Kuneff, Renee A, DO  medroxyPROGESTERone (PROVERA) 2.5 MG tablet Take 2.5 mg by mouth daily.    [provider]  propranolol ER (INDERAL LA) 80 MG 24 hr capsule TAKE 1 CAPSULE BY MOUTH DAILY 08/28/22   Kuneff, Renee A, DO  valACYclovir (VALTREX) 500 MG tablet Take 500 mg by mouth 2 (two) times daily.    [provider]      Allergies    Seroquel [quetiapine fumarate]    Review of Systems   Review of Systems  All other systems reviewed and are negative.   Physical Exam Updated Vital Signs BP 133/80 (BP Location: Right Arm)   Pulse 80   Temp 99 F (37.2 C) (Oral)   Resp 18   Ht 5' 6"$  (1.676 m)   Wt 61.2 kg   LMP 04/18/2019 (Approximate)   SpO2 95%   BMI 21.79 kg/m  Physical Exam Vitals and nursing note reviewed.  Constitutional:      Appearance: She is well-developed.  HENT:     Head: Atraumatic.     Mouth/Throat:     Comments: Facial swelling Cardiovascular:     Rate and Rhythm: Normal rate.  Pulmonary:     Effort: Pulmonary effort is normal.     Breath sounds: No  wheezing.  Musculoskeletal:     Cervical back: Normal range of motion and neck supple.  Skin:    General: Skin is warm and dry.     Findings: Erythema and rash present.     Comments: Diffuse urticarial rash  Neurological:     Mental Status: She is alert and oriented to person, place, and time.     ED Results / Procedures / Treatments   Labs (all labs ordered are listed, but only abnormal results are displayed) Labs Reviewed  CBC WITH DIFFERENTIAL/PLATELET - Abnormal; Notable for the following components:      Result Value   WBC 13.3 (*)    Hemoglobin 15.7 (*)    HCT 46.4 (*)    Neutro Abs 10.9 (*)    All other components within normal limits  I-STAT CHEM 8, ED - Abnormal; Notable for the following components:    Glucose, Bld 131 (*)    Calcium, Ion 1.07 (*)    TCO2 21 (*)    Hemoglobin 16.3 (*)    HCT 48.0 (*)    All other components within normal limits    EKG EKG Interpretation  Date/Time:  Friday August 23 2022 18:11:26 EST Ventricular Rate:  75 PR Interval:  127 QRS Duration: 82 QT Interval:  404 QTC Calculation: 452 R Axis:   72 Text Interpretation: Sinus rhythm Probable left atrial enlargement Anterior infarct, old No acute changes Confirmed by Varney Biles [phone]) on 08/23/2022 7:52:20 PM  Radiology No results found.  Procedures .Critical Care  Performed by: Varney Biles, MD Authorized by: Varney Biles, MD   Critical care provider statement:    Critical care time (minutes):  58   Critical care was necessary to treat or prevent imminent or life-threatening deterioration of the following conditions: Anaphylaxis.   Critical care was time spent personally by me on the following activities:  Development of treatment plan with patient or surrogate, discussions with consultants, evaluation of patient's response to treatment, examination of patient, ordering and review of laboratory studies, ordering and review of radiographic studies, ordering and performing treatments and interventions, pulse oximetry, re-evaluation of patient's condition and review of old charts     Medications Ordered in ED Medications  sodium chloride 0.9 % bolus 1,000 mL (0 mLs Intravenous Stopped 08/23/22 1911)  ondansetron (ZOFRAN) injection 4 mg (4 mg Intravenous Given 08/23/22 1825)  famotidine (PEPCID) IVPB 20 mg premix (0 mg Intravenous Stopped 08/23/22 1858)  EPINEPHrine (EPI-PEN) injection 0.3 mg (0.3 mg Intramuscular Given 08/23/22 1827)  ipratropium-albuterol (DUONEB) 0.5-2.5 (3) MG/3ML nebulizer solution 3 mL (3 mLs Nebulization Given 08/23/22 1829)  methylPREDNISolone sodium succinate (SOLU-MEDROL) 125 mg/2 mL injection 125 mg (125 mg Intravenous Given 08/23/22 1825)  sodium chloride 0.9 % bolus 1,000  mL (0 mLs Intravenous Stopped 08/23/22 2003)  EPINEPHrine (EPI-PEN) injection 0.3 mg (0.3 mg Intramuscular Given 08/23/22 1948)  diphenhydrAMINE (BENADRYL) injection 25 mg (25 mg Intravenous Given 08/24/22 0054)  pantoprazole (PROTONIX) injection 40 mg (40 mg Intravenous Given 08/24/22 0053)    ED Course/ Medical Decision Making/ A&P                             Medical Decision Making Amount and/or Complexity of Data Reviewed Labs: ordered.  Risk OTC drugs. Prescription drug management.  62 year old patient comes in with chief complaint of facial swelling, diffuse urticarial rash, nausea, vomiting.  She had taken Bactrim about 30 minutes prior to this episode.  Earlier  today she also had flu shot.  She has taken Bactrim in the past without similar outcome.  Differential diagnosis essentially is anaphylaxis, angioedema, mast cell release. No clear idea what triggered this, Bactrim is the likely suspect however she has taken it in the past without issues.  Patient received EpiPen per EMS.  I gave patient another round of EpiPen given she was still feeling nauseous.  Patient also received steroids in the ER.  We did start her on epi drip because of hypotension.  Epi drip was eventually discontinued.  After EpiPen was discontinued, patient will be observed for another 4 hours to ensure there is no rebound.  Patient wants to go home, but understands the rationale for keeping her for 4 hours.  Patient reassessed multiple times.  She is feeling a lot better -she is reliable, and appropriate discharge instructions have already been discussed.  Patient's care will be provided to incoming team.  Final Clinical Impression(s) / ED Diagnoses Final diagnoses:  Anaphylaxis, initial encounter    Rx / DC Orders ED Discharge Orders          Ordered    ondansetron (ZOFRAN-ODT) 8 MG disintegrating tablet  Every 8 hours PRN        08/24/22 0038    cetirizine-pseudoephedrine (ZYRTEC-D) 5-120 MG tablet  Daily         08/24/22 0038    pantoprazole (PROTONIX) 40 MG tablet  Daily        08/24/22 0038    predniSONE (DELTASONE) 10 MG tablet  Daily        08/23/22 2356    EPINEPHrine 0.3 mg/0.3 mL IJ SOAJ injection  As needed,   Status:  Discontinued        08/23/22 2356    diphenhydrAMINE (BENADRYL) 25 mg capsule  Every 6 hours PRN        08/23/22 2356    EPINEPHrine 0.3 mg/0.3 mL IJ SOAJ injection  As needed        08/23/22 2357              Varney Biles, MD 09/01/22 [id number]

## 2022-09-02 ENCOUNTER — Ambulatory Visit (INDEPENDENT_AMBULATORY_CARE_PROVIDER_SITE_OTHER): Payer: PRIVATE HEALTH INSURANCE | Admitting: Family Medicine

## 2022-09-02 ENCOUNTER — Encounter: Payer: Self-pay | Admitting: Family Medicine

## 2022-09-02 VITALS — BP 134/88 | HR 80 | Temp 98.5°F | Wt 141.6 lb

## 2022-09-02 DIAGNOSIS — R221 Localized swelling, mass and lump, neck: Secondary | ICD-10-CM

## 2022-09-02 DIAGNOSIS — T782XXD Anaphylactic shock, unspecified, subsequent encounter: Secondary | ICD-10-CM | POA: Diagnosis not present

## 2022-09-02 DIAGNOSIS — H04302 Unspecified dacryocystitis of left lacrimal passage: Secondary | ICD-10-CM

## 2022-09-02 DIAGNOSIS — E049 Nontoxic goiter, unspecified: Secondary | ICD-10-CM

## 2022-09-02 MED ORDER — AZITHROMYCIN 1 % OP SOLN: 1.0000 [drp] | Freq: Two times a day (BID) | OPHTHALMIC | 0 refills | Status: DC

## 2022-09-02 NOTE — Progress Notes (Signed)
Leah Jones , January 07, 1961, 62 y.o., female MRN: [id number] Patient Care Team    Relationship Specialty Notifications Start End  Ma Hillock, DO PCP - General Family Medicine  06/19/15   Obgyn, Erling Conte    03/05/22     Chief Complaint  Patient presents with   Mass    Lumps on front of neck. Has noticed it within the last 2 month. No complaints of pain; c/o fatigue had allergic reaction to food earlier this month.     Subjective: Pt presents for an OV with complaints of "lumps on her neck"  of 2 months duration.  She reports the lumps are located near her thyroid. 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  She states she has noticed it starting to drain more and her eye is matted shut in the morning. She also has concerns over developing new allergies.  She had a significant anaphylactic shock reaction to Bactrim.  Her symptoms started approximately 15-30 minutes after taking medication.  She states she has had this medication in the past and had never been allergic to the sulfa.  On a separate occasion, she has had a significant reaction with facial swelling after eating sushi.  She states she has eaten sushi routinely couple times a month.     03/05/2022    9:03 AM 12/12/2020    3:08 PM 12/05/2020    1:13 PM 04/22/2019    8:34 AM 04/22/2019    8:32 AM  Depression screen PHQ 2/9  Decreased Interest 0 0 0 0 0  Down, Depressed, Hopeless 0 0 0 1 1  PHQ - 2 Score 0 0 0 1 1  Altered sleeping    2   Tired, decreased energy    1   Change in appetite    2   Feeling bad or failure about yourself     1   Trouble concentrating    2   Moving slowly or fidgety/restless    0   Suicidal thoughts    0   PHQ-9 Score    9   Difficult doing work/chores    Somewhat difficult     Allergies  Allergen Reactions   Bactrim [Sulfamethoxazole-Trimethoprim] Anaphylaxis   Sulfa Antibiotics Anaphylaxis    Seroquel [Quetiapine Fumarate] Other (See Comments)    Increased sedation on low dose and leg weakness   Social History   Social History Narrative   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 alcohol related   Alcohol dependence, daily use ([place]) 11/17/2018   Alcoholism ([place])    per record   [place], HX OF 04/15/2007   Annotation: occasional rectal bleeding Qualifier: Diagnosis of  By: Larose Kells MD, [place] deficiency    Carpal tunnel syndrome    Carpal tunnel syndrome 12/01/2009   Qualifier: Diagnosis of  By: Larose Kells MD, [place] E.    COVID-19 virus infection 08/23/2019   Diarrhea 11/17/2018   Discoloration of skin of foot 05/20/2014   Purplish discoloration top of L foot    Eustachian tube dysfunction 01/25/2014   Mallet deformity of fifth finger, left, acquired 06/19/2015   Suicidal ideation    Past Surgical History:  Procedure Laterality Date   COLPOSCOPY  2020   pt reported benign.    FOOT  SURGERY Left    Morton's neuroma, Dr Paulla Dolly   RHINOPLASTY     TONSILLECTOMY     Family History  Problem Relation Age of Onset   Diabetes Other    Hypertension Other    Colon cancer Mother    Breast cancer Mother    Celiac disease Mother    Pancreatic cancer Paternal Grandfather    Anxiety disorder Sister    ADD / ADHD Sister    Bipolar disorder Brother    Pancreatic cancer Brother    Bipolar disorder Son    Alcoholism Father    Lung cancer Paternal Grandmother    Allergies as of 09/02/2022       Reactions   Bactrim [sulfamethoxazole-trimethoprim] Anaphylaxis   Sulfa Antibiotics Anaphylaxis   Seroquel [quetiapine Fumarate] Other (See Comments)   Increased sedation on low dose and leg weakness        Medication List        Accurate as of September 02, 2022  4:46 PM. If you have any questions, ask your nurse or doctor.          STOP taking these medications    predniSONE 10 MG tablet Commonly known as:  DELTASONE Stopped by: Howard Pouch, DO       TAKE these medications    azithromycin 1 % ophthalmic solution Commonly known as: AZASITE Apply 1 drop to eye 2 (two) times daily for 7 days. Started by: Howard Pouch, DO   b complex vitamins capsule Take by mouth.   cetirizine-pseudoephedrine 5-120 MG tablet Commonly known as: ZYRTEC-D Take 1 tablet by mouth daily.   diphenhydrAMINE 25 mg capsule Commonly known as: BENADRYL Take 1 capsule (25 mg total) by mouth every 6 (six) hours as needed for itching.   EPINEPHrine 0.3 mg/0.3 mL Soaj injection Commonly known as: EPI-PEN Inject 0.3 mg into the muscle as needed for anaphylaxis.   estradiol 1 MG tablet Commonly known as: ESTRACE Take 1 mg by mouth daily.   fluticasone 50 MCG/ACT nasal spray Commonly known as: FLONASE Place 2 sprays into both nostrils daily.   lisinopril 10 MG tablet Commonly known as: ZESTRIL Take 1 tablet (10 mg total) by mouth daily.   medroxyPROGESTERone 2.5 MG tablet Commonly known as: PROVERA Take 2.5 mg by mouth daily.   ondansetron 8 MG disintegrating tablet Commonly known as: ZOFRAN-ODT Take 1 tablet (8 mg total) by mouth every 8 (eight) hours as needed for nausea or vomiting.   pantoprazole 40 MG tablet Commonly known as: PROTONIX Take 1 tablet (40 mg total) by mouth daily.   propranolol ER 80 MG 24 hr capsule Commonly known as: INDERAL LA TAKE 1 CAPSULE BY MOUTH DAILY   valACYclovir 500 MG tablet Commonly known as: VALTREX Take 500 mg by mouth 2 (two) times daily.        All past medical history, surgical history, allergies, family history, immunizations andmedications were updated in the EMR today and reviewed under the history and medication portions of their EMR.     ROS Negative, with the exception of above mentioned in HPI   Objective:  BP 134/88   Pulse 80   Temp 98.5 F (36.9 C)   Wt 141 lb 9.6 oz (64.2 kg)   LMP 04/18/2019 (Approximate)   SpO2 97%   BMI 22.85  kg/m  Body mass index is 22.85 kg/m. Physical Exam Vitals and nursing note reviewed.  Constitutional:      General: She is not in acute distress.  Appearance: Normal appearance. She is normal weight. She is not ill-appearing or toxic-appearing.  HENT:     Head: Normocephalic and atraumatic.     Right Ear: Tympanic membrane, ear canal and external ear normal. There is no impacted cerumen.     Left Ear: Tympanic membrane normal. There is no impacted cerumen.     Nose: No congestion.  Eyes:     General: No scleral icterus.       Right eye: No discharge.        Left eye: Discharge present.    Extraocular Movements: Extraocular movements intact.     Conjunctiva/sclera: Conjunctivae normal.     Pupils: Pupils are equal, round, and reactive to light.  Neck:     Comments: Thyroid enlarged on exam.  Small mobile cystic structure present left upper pole thyroid/cricoid area. Musculoskeletal:     Cervical back: Neck supple.  Lymphadenopathy:     Cervical: No cervical adenopathy.  Skin:    Findings: No rash.  Neurological:     Mental Status: She is alert and oriented to person, place, and time. Mental status is at baseline.     Motor: No weakness.     Coordination: Coordination normal.     Gait: Gait normal.  Psychiatric:        Mood and Affect: Mood normal.        Behavior: Behavior normal.        Thought Content: Thought content normal.        Judgment: Judgment normal.     No results found. No results found. No results found for this or any previous visit (from the past 24 hour(s)).  Assessment/Plan: Montasia Klose is a 62 y.o. female present for OV for  Thyroid enlargement/mass of neck -There is thyroid enlargement on exam, with questionable thyroid nodule versus cystic structure over left thyroid lobe.  Will obtain lab work today and thyroid ultrasound to further evaluate. TSH - CBC w/Diff - T4, free - US THYROID; Future   Anaphylaxis, subsequent encounter Avoidance  of sulfa and all components of sushi at this time. She has an EpiPen. Continue Zyrtec nightly and carry Benadryl. - Ambulatory referral to Allergy  Lacrimal duct infection, left Azithromycin solution prescribed  Reviewed expectations re: course of current medical issues. Discussed self-management of symptoms. Outlined signs and symptoms indicating need for more acute intervention. Patient verbalized understanding and all questions were answered. Patient received an After-Visit Summary.    Orders Placed This Encounter  Procedures   US THYROID   TSH   CBC w/Diff   T4, free   Ambulatory referral to Allergy   Meds ordered this encounter  Medications   azithromycin (AZASITE) 1 % ophthalmic solution    Sig: Apply 1 drop to eye 2 (two) times daily for 7 days.    Dispense:  2.5 mL    Refill:  0   Referral Orders         Ambulatory referral to Allergy       Note is dictated utilizing voice recognition software. Although note has been proof read prior to signing, occasional typographical errors still can be missed. If any questions arise, please do not hesitate to call for verification.   electronically signed by:  Howard Pouch, DO  [place]

## 2022-09-02 NOTE — Patient Instructions (Addendum)
Return if symptoms worsen or fail to improve.        Great to see you today.  I have refilled the medication(s) we provide.   If labs were collected, we will inform you of lab results once received either by echart message or telephone call.   - echart message- for normal results that have been seen by the patient already.   - telephone call: abnormal results or if patient has not viewed results in their echart.

## 2022-09-03 ENCOUNTER — Telehealth: Payer: Self-pay

## 2022-09-03 LAB — CBC WITH DIFFERENTIAL/PLATELET
Absolute Monocytes: 915 cells/uL (ref 200–950)
Basophils Absolute: 51 cells/uL (ref 0–200)
Basophils Relative: 0.8 %
Eosinophils Absolute: 160 cells/uL (ref 15–500)
Eosinophils Relative: 2.5 %
HCT: 38.6 % (ref 35.0–45.0)
Hemoglobin: 13.1 g/dL (ref 11.7–15.5)
Lymphs Abs: 1914 cells/uL (ref 850–3900)
MCH: 32.8 pg (ref 27.0–33.0)
MCHC: 33.9 g/dL (ref 32.0–36.0)
MCV: 96.5 fL (ref 80.0–100.0)
MPV: 9.4 fL (ref 7.5–12.5)
Monocytes Relative: 14.3 %
Neutro Abs: 3360 cells/uL (ref 1500–7800)
Neutrophils Relative %: 52.5 %
Platelets: 383 10*3/uL (ref 140–400)
RBC: 4 10*6/uL (ref 3.80–5.10)
RDW: 11.7 % (ref 11.0–15.0)
Total Lymphocyte: 29.9 %
WBC: 6.4 10*3/uL (ref 3.8–10.8)

## 2022-09-03 LAB — TSH: TSH: 1.76 mIU/L (ref 0.40–4.50)

## 2022-09-03 LAB — T4, FREE: Free T4: 1.1 ng/dL (ref 0.8–1.8)

## 2022-09-03 NOTE — Telephone Encounter (Signed)
error 

## 2022-09-11 ENCOUNTER — Other Ambulatory Visit: Payer: Self-pay | Admitting: Family Medicine

## 2022-09-13 ENCOUNTER — Telehealth: Payer: Self-pay | Admitting: Family Medicine

## 2022-09-13 NOTE — Telephone Encounter (Signed)
Spoke with pt and informed her she is referred to OR allergy and eye drops sent to [facility] earlier in the month.

## 2022-09-13 NOTE — Telephone Encounter (Signed)
Leah Jones calls inquiring about a couple of different things. She reports that she was suppose to get some eye drops called into the pharmacy. She also is asking about the referral to the allergy Dr. She is referred to All[facility] in [place], however she reports that her and Dr. Raoul Pitch talked about a Dr. Drusilla Kanner at [place]. Please call patient.

## 2022-09-17 ENCOUNTER — Ambulatory Visit (HOSPITAL_BASED_OUTPATIENT_CLINIC_OR_DEPARTMENT_OTHER)
Admission: RE | Admit: 2022-09-17 | Discharge: 2022-09-17 | Disposition: A | Discharge status: Home / Self Care | Payer: PRIVATE HEALTH INSURANCE | Source: Ambulatory Visit | Attending: Family Medicine | Admitting: Family Medicine

## 2022-09-17 DIAGNOSIS — E049 Nontoxic goiter, unspecified: Secondary | ICD-10-CM | POA: Diagnosis present

## 2022-09-18 ENCOUNTER — Telehealth: Payer: Self-pay | Admitting: Family Medicine

## 2022-09-18 NOTE — Telephone Encounter (Signed)
Please inform patient her thyroid ultrasound results are without worrisome nodule or mass.  There was a small cyst on the right side of her thyroid, however radiology felt this was not a worrisome cyst and did not need any further follow-up dedicated to it.

## 2022-09-18 NOTE — Telephone Encounter (Signed)
Spoke with patient regarding results/recommendations.  

## 2022-09-19 ENCOUNTER — Ambulatory Visit: Payer: Managed Care, Other (non HMO) | Admitting: Allergy

## 2022-09-23 ENCOUNTER — Other Ambulatory Visit: Payer: Self-pay | Admitting: Family Medicine

## 2022-09-24 ENCOUNTER — Ambulatory Visit (INDEPENDENT_AMBULATORY_CARE_PROVIDER_SITE_OTHER): Payer: PRIVATE HEALTH INSURANCE | Admitting: Allergy

## 2022-09-24 ENCOUNTER — Encounter: Payer: Self-pay | Admitting: Allergy

## 2022-09-24 ENCOUNTER — Other Ambulatory Visit: Payer: Self-pay

## 2022-09-24 VITALS — BP 124/76 | HR 85 | Temp 97.6°F | Resp 16 | Ht 66.0 in | Wt 147.0 lb

## 2022-09-24 DIAGNOSIS — T7840XD Allergy, unspecified, subsequent encounter: Secondary | ICD-10-CM | POA: Diagnosis not present

## 2022-09-24 DIAGNOSIS — T7840XA Allergy, unspecified, initial encounter: Secondary | ICD-10-CM

## 2022-09-24 DIAGNOSIS — T781XXD Other adverse food reactions, not elsewhere classified, subsequent encounter: Secondary | ICD-10-CM

## 2022-09-24 DIAGNOSIS — Z889 Allergy status to unspecified drugs, medicaments and biological substances status: Secondary | ICD-10-CM

## 2022-09-24 DIAGNOSIS — J3089 Other allergic rhinitis: Secondary | ICD-10-CM | POA: Insufficient documentation

## 2022-09-24 DIAGNOSIS — T783XXA Angioneurotic edema, initial encounter: Secondary | ICD-10-CM | POA: Insufficient documentation

## 2022-09-24 DIAGNOSIS — T783XXD Angioneurotic edema, subsequent encounter: Secondary | ICD-10-CM | POA: Diagnosis not present

## 2022-09-24 DIAGNOSIS — Z91038 Other insect allergy status: Secondary | ICD-10-CM

## 2022-09-24 HISTORY — DX: Allergy, unspecified, initial encounter: T78.40XA

## 2022-09-24 NOTE — Patient Instructions (Signed)
Allergic reaction Question if it was from the bactrim and/or flu shot made worse by the UTI. No good testing for bactrim/sulfa antibiotics. Continue to avoid bactrim/sulfa based on clinical history. For the flu shot - recommend doing skin testing and graded in office challenge in the fall when we get the new flu shots.  Epinephrine injectable device - demonstrated proper use. For mild symptoms you can take over the counter antihistamines such as Benadryl 2 tablets ('50mg'$ ) and monitor symptoms closely. If symptoms worsen or if you have severe symptoms including breathing issues, throat closure, significant swelling, whole body hives, severe diarrhea and vomiting, lightheadedness then inject epinephrine and seek immediate medical care afterwards. Emergency action plan given. Make sure you take the epinephrine with you at all times.   Lip swelling Concerning for lisinopril causing this. Avoid all ace inhibitor type of medications.  Stop taking lisinopril and follow up with PCP regarding changing your blood pressure medication. Avoid seafood until bloodwork is back. If negative, will have you reintroduce at home as timeline unlikely to be related to the lip swelling episodes.   Get bloodwork We are ordering labs, so please allow 1-2 weeks for the results to come back. With the newly implemented Cures Act, the labs might be visible to you at the same time that they become visible to me. However, I will not address the results until all of the results are back, so please be patient.  In the meantime, continue recommendations in your patient instructions, including avoidance measures (if applicable), until you hear from me.  Keep track of allergic reactions and swelling. If you have another episode let us know.   Environmental allergies See below for environmental control measures. Use over the counter antihistamines such as Zyrtec (cetirizine), Claritin (loratadine), Allegra (fexofenadine), or  Xyzal (levocetirizine) daily as needed. May take twice a day during allergy flares. May switch antihistamines every few months. If interested we can do skin testing in the future for this.  Follow up in 6 months or sooner if needed.   Reducing Pollen Exposure Pollen seasons: trees (spring), grass (summer) and ragweed/weeds (fall). Keep windows closed in your home and car to lower pollen exposure.  Install air conditioning in the bedroom and throughout the house if possible.  Avoid going out in dry windy days - especially early morning. Pollen counts are highest between 5 - 10 AM and on dry, hot and windy days.  Save outside activities for late afternoon or after a heavy rain, when pollen levels are lower.  Avoid mowing of grass if you have grass pollen allergy. Be aware that pollen can also be transported indoors on people and pets.  Dry your clothes in an automatic dryer rather than hanging them outside where they might collect pollen.  Rinse hair and eyes before bedtime.

## 2022-09-24 NOTE — Assessment & Plan Note (Signed)
Symptoms in the spring and fall. Takes zyrtec prn with unknown benefit. No prior testing. See below for environmental control measures - symptoms concerning for pollen allergy.  Use over the counter antihistamines such as Zyrtec (cetirizine), Claritin (loratadine), Allegra (fexofenadine), or Xyzal (levocetirizine) daily as needed. May take twice a day during allergy flares. May switch antihistamines every few months. If interested we can do skin testing in the future for this - declined today.

## 2022-09-24 NOTE — Assessment & Plan Note (Signed)
Facial swelling after yellow jacket stings as a child and localized reactions as an adult. No prior work up. Continue to avoid. Get bloodwork. Carry epinephrine pen.

## 2022-09-24 NOTE — Assessment & Plan Note (Addendum)
One episode of lip angioedema. She was concerned about seafood allergy as the day prior she had some type of new sushi. Previously tolerated seafood with no issues. She was also recently started on lisinopril. No family history of angioedema. 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  Stop taking lisinopril and follow up with PCP regarding changing blood pressure medication. Avoid all ace inhibitor type of medications.  Unlikely to be related to the seafood but will get bloodwork for this.  If negative, will discuss at home reintroduction.  Get bloodwork to rule out other etiologies.

## 2022-09-24 NOTE — Progress Notes (Signed)
New Patient Note  RE: Leah Jones MRN: [id number] DOB: 1961/02/28 Date of Office Visit: 09/24/2022  Consult requested by: Leah Hillock, DO Primary care provider: Ma Hillock, DO  Chief Complaint: Allergic Reaction (Had a yeast infection medication and broke out in hives immediately. Called the ambulance her BP dropped, and did not have a body temp,face swelled,threw up. /Few weeks before she ate sushi and next morning her whole face was swollen. Avoiding seafood. ) and Allergic Rhinitis  (Congestion, sneezing different times of the day. Watery eyes, runny nose )  History of Present Illness: I had the pleasure of seeing Leah Jones for initial evaluation at the [facility] on 09/24/2022. She is a 62 y.o. female, who is referred here by Leah Jones A, DO for the evaluation of drug and food allergies.  Drug reaction: Reaction to bactrim which occurred in February 2024. Patient was being treated for a possible UTI. Symptoms started after taking it within 30 minutes. Symptoms include: itching, dizziness, hives, facial/hand/feet swelling.  She felt like she couldn't breathe. She had loss of bowel and urine at that time. She tried to get up and go to the bathroom and had loss of consciousness.   Symptoms persisted for a 10+ hours.  Treatment included: she self-administered IM epinephrine at home with no benefit. EMS was called and she received 2 more IM epi in the hospital and was placed on an epi drip due to hypotension. Mucosal involvement: no.  Denies any fevers, chills, foods, personal care products or recent infections. Systemic steroids: yes.   She also had the flu shot the same day when she picked up the bactrim. She took the bactrim on an empty stomach.  Previous work up includes: none. Previous history of rash/hives/allergic reactions: no. Previous history of drug reactions: none.  Previously tolerated bactrim with no issues. Patient has not had the  flu shot for about 5 years but had no issues with it in the past.  No issues with vaccines.   Food: Every Friday patient would go eat sushi.  3 weeks prior to the above allergic reaction she had a different type of roll and not sure what it had in it. The following day she noted lip/facial swelling which was more than 12 hours after lunch. Previously tolerated with no issues. Currently avoiding seafood.    She does have access to epinephrine autoinjector.  Past work up includes: none. Dietary History: patient has been eating other foods including milk, eggs, peanut, treenuts, sesame, soy, wheat, meats, fruits and vegetables.  She reports reading labels and avoiding seafood in diet completely.   Patient had lip/facial swelling while at work. Symptoms resolved the following day.  Suspected triggers are unknown. Denies any fevers, chills, changes in medications, personal care products or recent infections. She has tried the following therapies: benadryl with good benefit. Systemic steroids none.  Previous work up includes: none. February 2024 CBC diff and TSH normal. Previous history of swelling: no. Family history of angioedema: no. Patient is up to date with the following cancer screening tests: physical exam, mammogram, pap smears, colonoscopy. Ace-inhibitor use: yes Patient started on lisinopril before the swelling episode and takes it in the mornings.   Reviewed images on the phone - slight lip angioedema noted.  09/02/2022 PCP visit: "She also has concerns over developing new allergies. She had a significant anaphylactic shock reaction to Bactrim. Her symptoms started approximately 15-30 minutes after taking medication. She states she has  had this medication in the past and had never been allergic to the sulfa. On a separate occasion, she has had a significant reaction with facial swelling after eating sushi. She states she has eaten sushi routinely couple times a month.  "  08/23/2022 ER visit: "62 year old female comes in with chief complaint of allergic reaction. Patient reports that she received a flu shot around 1 PM.  About 4:00, she took her first dose of Bactrim.  About 30 minutes after that she started noticing hives, swelling to her face, nausea and vomiting.  According to EMS, the right patient's face was puffy, and it appeared that her lip was swollen as well.   Patient received epinephrine per EMS along with Benadryl.  Patient has no known history of severe allergies.  She has taken Bactrim in the past without complications.  62 year old patient comes in with chief complaint of facial swelling, diffuse urticarial rash, nausea, vomiting.  She had taken Bactrim about 30 minutes prior to this episode.  Earlier today she also had flu shot.  She has taken Bactrim in the past without similar outcome.   Differential diagnosis essentially is anaphylaxis, angioedema, mast cell release. No clear idea what triggered this, Bactrim is the likely suspect however she has taken it in the past without issues.   Patient received EpiPen per EMS.  I gave patient another round of EpiPen given she was still feeling nauseous.  Patient also received steroids in the ER.  We did start her on epi drip because of hypotension.  Epi drip was eventually discontinued.  After EpiPen was discontinued, patient will be observed for another 4 hours to ensure there is no rebound.  Patient wants to go home, but understands the rationale for keeping her for 4 hours.   Patient reassessed multiple times.  She is feeling a lot better -she is reliable, and appropriate discharge instructions have already been discussed.  Patient's care will be provided to incoming team."  Assessment and Plan: [name] is a 62 y.o. female with: Allergic reaction Allergic reaction in February (pruritus, dizziness, hives, facial/hand/feet swelling, trouble breathing, loss of bowel/urine, loss of consciousness)  requiring IM epi x 2 and epi drip due to hypotension. That day she had the flu shot (last one was 5 yrs ago and no issues with vaccines in the past) and started taking bactrim for possible UTI. Symptoms started 30 min after taking Bactrim on an empty stomach. Previously took Bactrim with no issues. No prior drug allergies.  Discussed with patient that clinical timeline suspicious for Bactrim causing the above reaction but can't rule out delayed response to flu shot and symptoms may have been exacerbated by her UTI infection.  No good testing for bactrim/sulfa antibiotics unfortunately.  Continue to avoid bactrim/sulfa based on clinical history. For the flu shot - recommend doing skin testing and graded in office challenge in the fall.  Epinephrine injectable device - demonstrated proper use. For mild symptoms you can take over the counter antihistamines such as Benadryl 2 tablets ('50mg'$ ) and monitor symptoms closely. If symptoms worsen or if you have severe symptoms including breathing issues, throat closure, significant swelling, whole body hives, severe diarrhea and vomiting, lightheadedness then inject epinephrine and seek immediate medical care afterwards. Emergency action plan given. Make sure you take the epinephrine with you at all times.  Get bloodwork. Keep track of allergic reactions. If you have another episode let us know.   Angio-edema One episode of lip angioedema. She was concerned about seafood  allergy as the day prior she had some type of new sushi. Previously tolerated seafood with no issues. She was also recently started on lisinopril. No family history of angioedema. 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  Stop taking lisinopril and follow up with PCP regarding changing blood pressure medication. Avoid all ace inhibitor type of medications.  Unlikely to be related to the seafood but will get bloodwork for this.  If negative, will discuss  at home reintroduction.  Get bloodwork to rule out other etiologies.   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 No prior work up. Continue to avoid. Get bloodwork. Carry epinephrine pen.   Other allergic rhinitis Symptoms in the spring and fall. Takes zyrtec prn with unknown benefit. No prior testing. See below for environmental control measures - symptoms concerning for pollen allergy.  Use over the counter antihistamines such as Zyrtec (cetirizine), Claritin (loratadine), Allegra (fexofenadine), or Xyzal (levocetirizine) daily as needed. May take twice a day during allergy flares. May switch antihistamines every few months. If interested we can do skin testing in the future for this - declined today.  Return in about 6 months (around 03/27/2023).  No orders of the defined types were placed in this encounter.  Lab Orders         Allergen Profile, Food-Fish         Allergen Profile, Shellfish         Allergen Hymenoptera Panel         Tryptase         Alpha-Gal Panel         ANA w/Reflex         C1 esterase inhibitor, functional         C1 Esterase Inhibitor         C3 and C4         CBC with Differential/Platelet         Complement component c1q         Comprehensive metabolic panel         C-reactive protein         Sedimentation rate         Protein electrophoresis, serum         Protein Electrophoresis, Urine Rflx.      Other allergy screening: Asthma: no Rhino conjunctivitis: yes Some rhinitis symptoms in the spring and fall. No prior allergy testing.  Takes zyrtec prn with unknown benefit.  Hymenoptera allergy:  Facial swelling after yellow jackets stings at age 69. As an adult she had localized reactions.  Urticaria: no Eczema: around the ears/nose at times. History of recurrent infections suggestive of immunodeficency: no  Diagnostics: None.   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 alcohol related   Alcohol dependence,  daily use ([place]) 11/17/2018   Alcoholism ([place])    per record   ANAL FISSURE, HX OF 04/15/2007   Annotation: occasional rectal bleeding Qualifier: Diagnosis of  By: Larose Kells MD, [place]    Angio-edema    B12 deficiency    Carpal tunnel syndrome    Carpal tunnel syndrome 12/01/2009   Qualifier: Diagnosis of  By: Larose Kells MD, [place].    COVID-19 virus infection 08/23/2019   Diarrhea 11/17/2018   Discoloration of skin of foot 05/20/2014   Purplish discoloration top of L foot    Eczema    Eustachian tube dysfunction 01/25/2014   Mallet deformity of fifth finger, left, acquired 06/19/2015   Suicidal ideation    Urticaria    Past Surgical History: Past Surgical History:  Procedure Laterality Date   COLPOSCOPY  2020   pt reported benign.    FOOT SURGERY Left    Morton's neuroma, Dr Paulla Dolly   RHINOPLASTY     TONSILLECTOMY     Medication List:  Current Outpatient Medications  Medication Sig Dispense Refill   b complex vitamins capsule Take by mouth.     diphenhydrAMINE (BENADRYL) 25 mg capsule Take 1 capsule (25 mg total) by mouth every 6 (six) hours as needed for itching. 20 capsule 0   EPINEPHrine 0.3 mg/0.3 mL IJ SOAJ injection Inject 0.3 mg into the muscle as needed for anaphylaxis. 2 each 1    estradiol (ESTRACE) 1 MG tablet Take 1 mg by mouth daily.     medroxyPROGESTERone (PROVERA) 2.5 MG tablet Take 2.5 mg by mouth daily.     ondansetron (ZOFRAN-ODT) 8 MG disintegrating tablet Take 1 tablet (8 mg total) by mouth every 8 (eight) hours as needed for nausea or vomiting. 20 tablet 0   propranolol ER (INDERAL LA) 80 MG 24 hr capsule TAKE 1 CAPSULE BY MOUTH DAILY 30 capsule 0   valACYclovir (VALTREX) 500 MG tablet Take 500 mg by mouth 2 (two) times daily.     No current facility-administered medications for this visit.   Allergies: Allergies  Allergen Reactions   Bactrim [Sulfamethoxazole-Trimethoprim] Anaphylaxis   Sulfa Antibiotics Anaphylaxis   Lisinopril     ? angioedema   Seroquel [Quetiapine Fumarate] Other (See Comments)    Increased sedation on low dose and leg weakness   Social History: Social History   Socioeconomic History   Marital status: Divorced    Spouse name: Not on file   Number of children: 3   Years of education: Not on file   Highest education level: Not on file  Occupational History   Occupation: Buyer, retail: [employer]  Tobacco Use   Smoking status: Some Days    Types: Cigarettes   Smokeless tobacco: Never  Vaping Use   Vaping Use: Never used  Substance and Sexual Activity   Alcohol use: Yes    Comment: Drinking 6 days a week. 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 Smoking: sometimes cigarettes. Occupation: works part  time   Environmental History: Water Damage/mildew in the house: no Carpet in the family room:  no Carpet in the bedroom: no Heating: heat pump Cooling: central Pet: yes 1 cat x 3 yrs  Family History: Family History  Problem Relation Age of Onset   Asthma Mother    Colon cancer Mother    Breast cancer Mother    Celiac disease Mother    Alcoholism Father    Asthma Sister    Anxiety disorder Sister    ADD / ADHD Sister    Bipolar disorder Brother    Pancreatic cancer Brother    Lung cancer Paternal Grandmother    Pancreatic cancer Paternal Grandfather    Asthma Son    Eczema Son    Bipolar disorder Son    Diabetes Other    Hypertension Other    Asthma Daughter    Review of Systems  Constitutional:  Negative for appetite change, chills, fever and unexpected weight change.  HENT:  Positive for congestion. Negative for rhinorrhea.   Eyes:  Negative for itching.       Watery eyes  Respiratory:  Negative for cough, chest tightness, shortness of breath and wheezing.   Cardiovascular:  Negative for chest pain.  Gastrointestinal:  Positive for diarrhea. Negative for abdominal pain, constipation, nausea and vomiting.  Genitourinary:  Negative for difficulty urinating.  Skin:  Negative for rash.  Neurological:  Negative for headaches.    Objective: BP 124/76   Pulse 85   Temp 97.6 F (36.4 C)   Resp 16   Ht '5\' 6"'$  (1.676 m)   Wt 147 lb (66.7 kg)   LMP 04/18/2019 (Approximate)   SpO2 96%   BMI 23.73 kg/m  Body mass index is 23.73 kg/m. Physical Exam Vitals and nursing note reviewed.  Constitutional:      Appearance: Normal appearance. She is well-developed.  HENT:     Head: Normocephalic and atraumatic.     Right Ear: Tympanic membrane and external ear normal.     Left Ear: Tympanic membrane and external ear normal.     Nose:     Comments: Dried discharge b/l.    Mouth/Throat:     Mouth: Mucous membranes are moist.     Pharynx: Oropharynx is clear.  Eyes:     Conjunctiva/sclera: Conjunctivae normal.  Cardiovascular:     Rate and Rhythm: Normal rate and  regular rhythm.     Heart sounds: Normal heart sounds. No murmur heard.    No friction rub. No gallop.  Pulmonary:     Effort: Pulmonary effort is normal.     Breath sounds: Normal breath sounds. No wheezing, rhonchi or rales.  Musculoskeletal:     Cervical back: Neck supple.  Skin:    General: Skin is warm.     Findings: No rash.  Neurological:     Mental Status: She is alert and oriented to person, place, and time.  Psychiatric:        Behavior: Behavior normal.   The plan was reviewed with the patient/family, and all questions/concerned were addressed.  It was my pleasure to see Aryhanna today and participate in her care. Please feel free to contact me with any questions or concerns.  Sincerely,  Rexene Alberts, DO Allergy & Immunology  [facility] office: [place] office: [phone]

## 2022-09-24 NOTE — Assessment & Plan Note (Addendum)
Allergic reaction in February (pruritus, dizziness, hives, facial/hand/feet swelling, trouble breathing, loss of bowel/urine, loss of consciousness) requiring IM epi x 2 and epi drip due to hypotension. That day she had the flu shot (last one was 5 yrs ago and no issues with vaccines in the past) and started taking bactrim for possible UTI. Symptoms started 30 min after taking Bactrim on an empty stomach. Previously took Bactrim with no issues. No prior drug allergies.  Discussed with patient that clinical timeline suspicious for Bactrim causing the above reaction but can't rule out delayed response to flu shot and symptoms may have been exacerbated by her UTI infection.  No good testing for bactrim/sulfa antibiotics unfortunately.  Continue to avoid bactrim/sulfa based on clinical history. For the flu shot - recommend doing skin testing and graded in office challenge in the fall.  Epinephrine injectable device - demonstrated proper use. For mild symptoms you can take over the counter antihistamines such as Benadryl 2 tablets ('50mg'$ ) and monitor symptoms closely. If symptoms worsen or if you have severe symptoms including breathing issues, throat closure, significant swelling, whole body hives, severe diarrhea and vomiting, lightheadedness then inject epinephrine and seek immediate medical care afterwards. Emergency action plan given. Make sure you take the epinephrine with you at all times.  Get bloodwork. Keep track of allergic reactions. If you have another episode let us know.

## 2022-09-27 ENCOUNTER — Other Ambulatory Visit: Payer: Self-pay | Admitting: Family Medicine

## 2022-09-27 MED ORDER — PROPRANOLOL HCL ER 80 MG PO CP24: 80.0000 mg | ORAL_CAPSULE | Freq: Every day | ORAL | 0 refills | Status: DC

## 2022-09-27 NOTE — Addendum Note (Signed)
Addended by: Beatrix Fetters on: 09/27/2022 10:49 AM   Modules accepted: Orders

## 2022-09-27 NOTE — Telephone Encounter (Signed)
Patient has only 1 pill left.  Scheduled OV with Dr. Raoul Pitch on 3/27.  [facility] [address]  propranolol ER (INDERAL LA) 80 MG 24 hr capsule

## 2022-10-04 LAB — C1 ESTERASE INHIBITOR, FUNCTIONAL: C1INH Functional/C1INH Total MFr SerPl: 65 %mean normal — ABNORMAL LOW

## 2022-10-04 LAB — COMPREHENSIVE METABOLIC PANEL
ALT: 18 IU/L (ref 0–32)
AST: 29 IU/L (ref 0–40)
Albumin/Globulin Ratio: 1.8 (ref 1.2–2.2)
Albumin: 4.4 g/dL (ref 3.8–4.9)
Alkaline Phosphatase: 67 IU/L (ref 44–121)
BUN/Creatinine Ratio: 9 (ref 9–23)
BUN: 6 mg/dL (ref 6–24)
Bilirubin Total: 0.6 mg/dL (ref 0.0–1.2)
CO2: 20 mmol/L (ref 20–29)
Calcium: 9.2 mg/dL (ref 8.7–10.2)
Chloride: 97 mmol/L (ref 96–106)
Creatinine, Ser: 0.7 mg/dL (ref 0.57–1.00)
Globulin, Total: 2.4 g/dL (ref 1.5–4.5)
Glucose: 78 mg/dL (ref 70–99)
Potassium: 4.8 mmol/L (ref 3.5–5.2)
Sodium: 133 mmol/L — ABNORMAL LOW (ref 134–144)
Total Protein: 6.8 g/dL (ref 6.0–8.5)
eGFR: 100 mL/min/{1.73_m2} (ref >59)

## 2022-10-04 LAB — CBC WITH DIFFERENTIAL/PLATELET
Basophils Absolute: 0.1 10*3/uL (ref 0.0–0.2)
Basos: 1 %
EOS (ABSOLUTE): 0.1 10*3/uL (ref 0.0–0.4)
Eos: 2 %
Hematocrit: 37.7 % (ref 34.0–46.6)
Hemoglobin: 12.7 g/dL (ref 11.1–15.9)
Immature Grans (Abs): 0 10*3/uL (ref 0.0–0.1)
Immature Granulocytes: 0 %
Lymphocytes Absolute: 1.8 10*3/uL (ref 0.7–3.1)
Lymphs: 28 %
MCH: 32.3 pg (ref 26.6–33.0)
MCHC: 33.7 g/dL (ref 31.5–35.7)
MCV: 96 fL (ref 79–97)
Monocytes Absolute: 0.8 10*3/uL (ref 0.1–0.9)
Monocytes: 13 %
Neutrophils Absolute: 3.5 10*3/uL (ref 1.4–7.0)
Neutrophils: 56 %
Platelets: 326 10*3/uL (ref 150–450)
RBC: 3.93 x10E6/uL (ref 3.77–5.28)
RDW: 11.5 % — ABNORMAL LOW (ref 11.7–15.4)
WBC: 6.2 10*3/uL (ref 3.4–10.8)

## 2022-10-04 LAB — ALLERGEN PROFILE, SHELLFISH
Clam IgE: <0.1 kU/L
F023-IgE Crab: <0.1 kU/L
F080-IgE Lobster: <0.1 kU/L
F290-IgE Oyster: <0.1 kU/L
Scallop IgE: <0.1 kU/L
Shrimp IgE: <0.1 kU/L

## 2022-10-04 LAB — ALLERGEN PROFILE, FOOD-FISH
Allergen Mackerel IgE: <0.1 kU/L
Allergen Salmon IgE: <0.1 kU/L
Allergen Trout IgE: <0.1 kU/L
Allergen Walley Pike IgE: <0.1 kU/L
Codfish IgE: <0.1 kU/L
Halibut IgE: <0.1 kU/L
Tuna: <0.1 kU/L

## 2022-10-04 LAB — PROTEIN ELECTROPHORESIS, SERUM
A/G Ratio: 1.3 (ref 0.7–1.7)
Albumin ELP: 3.8 g/dL (ref 2.9–4.4)
Alpha 1: 0.3 g/dL (ref 0.0–0.4)
Alpha 2: 0.6 g/dL (ref 0.4–1.0)
Beta: 0.9 g/dL (ref 0.7–1.3)
Gamma Globulin: 1.2 g/dL (ref 0.4–1.8)
Globulin, Total: 3 g/dL (ref 2.2–3.9)
M-Spike, %: 0.2 g/dL — ABNORMAL HIGH

## 2022-10-04 LAB — ALPHA-GAL PANEL
Allergen Lamb IgE: <0.1 kU/L
Beef IgE: <0.1 kU/L
IgE (Immunoglobulin E), Serum: 463 IU/mL (ref 6–495)
O215-IgE Alpha-Gal: <0.1 kU/L
Pork IgE: <0.1 kU/L

## 2022-10-04 LAB — SEDIMENTATION RATE: Sed Rate: 2 mm/hr (ref 0–40)

## 2022-10-04 LAB — PROTEIN ELECTROPHORESIS, URINE REFLEX
Albumin ELP, Urine: 100 %
Alpha-1-Globulin, U: 0 %
Alpha-2-Globulin, U: 0 %
Beta Globulin, U: 0 %
Gamma Globulin, U: 0 %
Protein, Ur: 13.6 mg/dL

## 2022-10-04 LAB — C3 AND C4
Complement C3, Serum: 118 mg/dL (ref 82–167)
Complement C4, Serum: 19 mg/dL (ref 12–38)

## 2022-10-04 LAB — ANA W/REFLEX: Anti Nuclear Antibody (ANA): NEGATIVE

## 2022-10-04 LAB — ALLERGEN HYMENOPTERA PANEL
Bumblebee: <0.1 kU/L
Honeybee IgE: <0.1 kU/L
Hornet, White Face, IgE: 0.79 kU/L — AB
Hornet, Yellow, IgE: 0.35 kU/L — AB
Paper Wasp IgE: 4.65 kU/L — AB
Yellow Jacket, IgE: 3.18 kU/L — AB

## 2022-10-04 LAB — C1 ESTERASE INHIBITOR: C1INH SerPl-mCnc: 24 mg/dL (ref 21–39)

## 2022-10-04 LAB — TRYPTASE: Tryptase: 5.3 ug/L (ref 2.2–13.2)

## 2022-10-04 LAB — COMPLEMENT COMPONENT C1Q: Complement C1Q: 9.6 mg/dL — ABNORMAL LOW (ref 10.3–20.5)

## 2022-10-04 LAB — C-REACTIVE PROTEIN: CRP: 2 mg/L (ref 0–10)

## 2022-10-08 NOTE — Progress Notes (Signed)
Spoke with patient regarding results. SPEP showed M-spike - will place referral to heme/onc for further evaluation. Continue to avoid all ace inhibitors.  C1q was borderline low and c1inh fxn was borderline low - will recheck these 2 values next month. Will mail labs.   Stinging insect panel was positive to multiple items. Continue to avoid and has Epipen. Once she has seen heme/onc and other work up is normal consider venom immunotherapy for this.

## 2022-10-08 NOTE — Addendum Note (Signed)
Addended by: Garnet Sierras on: 10/08/2022 05:49 PM   Modules accepted: Orders

## 2022-10-16 ENCOUNTER — Ambulatory Visit: Payer: PRIVATE HEALTH INSURANCE | Admitting: Family Medicine

## 2022-10-29 ENCOUNTER — Other Ambulatory Visit: Payer: Self-pay | Admitting: Family Medicine

## 2022-10-31 ENCOUNTER — Telehealth: Payer: Self-pay | Admitting: Family Medicine

## 2022-10-31 MED ORDER — PROPRANOLOL HCL ER 80 MG PO CP24: 80.0000 mg | ORAL_CAPSULE | Freq: Every day | ORAL | 0 refills | Status: DC

## 2022-10-31 NOTE — Telephone Encounter (Signed)
Pt is scheduled for 4/22 for medication refill which I offered sooner appointment, however, she is wanting to wait on some lab results from from [facility]. She is requesting a temp fill on   propranolol ER (INDERAL LA) 80 MG 24 hr capsule

## 2022-11-11 ENCOUNTER — Ambulatory Visit: Payer: PRIVATE HEALTH INSURANCE | Admitting: Family Medicine

## 2022-11-13 ENCOUNTER — Other Ambulatory Visit: Payer: Self-pay | Admitting: Family Medicine

## 2022-11-19 NOTE — Telephone Encounter (Signed)
Patient is checking status of refill.  She is scheduled to come for visit tomorrow afternoon. No response from refill request sent on 4/11.  Patient is not happy.  Please advise.

## 2022-11-20 ENCOUNTER — Encounter: Payer: Self-pay | Admitting: Family Medicine

## 2022-11-20 ENCOUNTER — Ambulatory Visit (INDEPENDENT_AMBULATORY_CARE_PROVIDER_SITE_OTHER): Payer: PRIVATE HEALTH INSURANCE | Admitting: Family Medicine

## 2022-11-20 VITALS — BP 132/88 | HR 98 | Temp 98.7°F | Ht 66.0 in | Wt 146.0 lb

## 2022-11-20 DIAGNOSIS — R251 Tremor, unspecified: Secondary | ICD-10-CM | POA: Diagnosis not present

## 2022-11-20 DIAGNOSIS — R778 Other specified abnormalities of plasma proteins: Secondary | ICD-10-CM | POA: Diagnosis not present

## 2022-11-20 DIAGNOSIS — F4323 Adjustment disorder with mixed anxiety and depressed mood: Secondary | ICD-10-CM

## 2022-11-20 DIAGNOSIS — I10 Essential (primary) hypertension: Secondary | ICD-10-CM

## 2022-11-20 DIAGNOSIS — F430 Acute stress reaction: Secondary | ICD-10-CM

## 2022-11-20 LAB — C1 ESTERASE INHIBITOR, FUNCTIONAL: C1INH Functional/C1INH Total MFr SerPl: 92 %mean normal

## 2022-11-20 LAB — COMPLEMENT COMPONENT C1Q: Complement C1Q: 10.1 mg/dL — ABNORMAL LOW (ref 10.3–20.5)

## 2022-11-20 MED ORDER — PROPRANOLOL HCL ER 80 MG PO CP24: 80.0000 mg | ORAL_CAPSULE | Freq: Every day | ORAL | 1 refills | Status: DC

## 2022-11-20 MED ORDER — AZITHROMYCIN 1 % OP SOLN: 1.0000 [drp] | Freq: Two times a day (BID) | OPHTHALMIC | 0 refills | Status: AC

## 2022-11-20 MED ORDER — AMLODIPINE BESYLATE 2.5 MG PO TABS
2.5000 mg | ORAL_TABLET | Freq: Every day | ORAL | 1 refills | Status: DC
Start: 2022-11-20 — End: 2022-12-10

## 2022-11-20 NOTE — Telephone Encounter (Signed)
LM for pt to return call to discuss.  

## 2022-11-20 NOTE — Progress Notes (Signed)
Please call patient.  Repeat bloodwork for the swelling panel is better.   Anymore swelling episodes?  Did she go see heme/onc yet?

## 2022-11-20 NOTE — Progress Notes (Signed)
Leah Jones , 05-04-1961, 62 y.o., female MRN: [id number] Patient Care Team    Relationship Specialty Notifications Start End  Natalia Leatherwood, DO PCP - General Family Medicine  06/19/15   Obgyn, Ma Hillock    03/05/22     Chief Complaint  Patient presents with   Medical Management of Chronic Issues     Subjective: Leah Jones is a 62 y.o. female  Hypertension/tremor/panic: Pt reports compliance with Inderal 80 mg daily. Patient denies chest pain, shortness of breath, dizziness or lower extremity edema.  Lisinopril was started 6 mos prior to lip swelling event- allergist recommend she stop  lisinopril. Bps are elevated and pt has had symptoms of feeling "off." Sulfur medication had also been used 20 minutes prior to onset of angioedema. Prior note: returns today for elevated blood pressures.  She states she was seen at her gynecologist a few days ago and had an elevated blood pressure at their office in the 150s systolic and she thinks 90s diastolic.  She is taking the Inderal 80 mg for her tremors and has noticed an improvement.  Some of the tremor is still present.  Her voice is still very shaky.  She does have a neurology establishment appointment for November.  Prior note Pt presents for an OV with complaints of hand tremors, shaky voice and head movement involuntarily.  Patient states she has noticed it has become more noticeable over the last 2 months.  She feels maybe it improves after she eats.  She has a past medical history of anxiety, panic attack, B12 deficiency and alcohol dependence.  She is having numbness and tingling in her fingers and toes.  She has a known B12 deficiency.  She states that she gets back in the habit of taking her B vitamins it can improve the feeling in her extremities.  She had been able to cut back on her alcohol use in the past, but admits she is drinking alcohol daily again.     11/20/2022    2:15 PM 03/05/2022    9:03 AM 12/12/2020    3:08 PM  12/05/2020    1:13 PM 04/22/2019    8:34 AM  Depression screen PHQ 2/9  Decreased Interest 0 0 0 0 0  Down, Depressed, Hopeless 0 0 0 0 1  PHQ - 2 Score 0 0 0 0 1  Altered sleeping 3    2  Tired, decreased energy 2    1  Change in appetite 0    2  Feeling bad or failure about yourself  0    1  Trouble concentrating 3    2  Moving slowly or fidgety/restless 2    0  Suicidal thoughts 0    0  PHQ-9 Score 10    9  Difficult doing work/chores Somewhat difficult    Somewhat difficult    Allergies  Allergen Reactions   Bactrim [Sulfamethoxazole-Trimethoprim] Anaphylaxis   Sulfa Antibiotics Anaphylaxis   Lisinopril     ? 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 alcohol related   Alcohol dependence, daily use (HCC) 11/17/2018   Alcoholism (HCC)    per record   Allergic reaction 09/24/2022   ANAL FISSURE, HX OF  04/15/2007   Annotation: occasional rectal bleeding Qualifier: Diagnosis of  By: Drue Novel MD, Nolon Rod.    Angio-edema    B12 deficiency    Carpal tunnel syndrome    Carpal tunnel syndrome 12/01/2009   Qualifier: Diagnosis of  By: Drue Novel MD, Jose E.    COVID-19 virus infection 08/23/2019   Diarrhea 11/17/2018   Discoloration of skin of foot 05/20/2014   Purplish discoloration top of L foot    Eczema    Eustachian tube dysfunction 01/25/2014   Mallet deformity of fifth finger, left, acquired 06/19/2015   Suicidal ideation    Urticaria    Past Surgical History:  Procedure Laterality Date   COLPOSCOPY  2020   pt reported benign.    FOOT SURGERY Left    Morton's neuroma, Dr Charlsie Merles   RHINOPLASTY     TONSILLECTOMY     Family History  Problem Relation Age of Onset   Asthma Mother    Colon cancer Mother    Breast cancer Mother    Celiac disease Mother     Alcoholism Father    Asthma Sister    Anxiety disorder Sister    ADD / ADHD Sister    Bipolar disorder Brother    Pancreatic cancer Brother    Lung cancer Paternal Grandmother    Pancreatic cancer Paternal Grandfather    Asthma Son    Eczema Son    Bipolar disorder Son    Diabetes Other    Hypertension Other    Asthma Daughter    Allergies as of 11/20/2022       Reactions   Bactrim [sulfamethoxazole-trimethoprim] Anaphylaxis   Sulfa Antibiotics Anaphylaxis   Lisinopril    ? angioedema   Seroquel [quetiapine Fumarate] Other (See Comments)   Increased sedation on low dose and leg weakness        Medication List        Accurate as of Nov 20, 2022  3:19 PM. If you have any questions, ask your nurse or doctor.          STOP taking these medications    diphenhydrAMINE 25 mg capsule Commonly known as: BENADRYL Stopped by: Felix Pacini, DO   ondansetron 8 MG disintegrating tablet Commonly known as: ZOFRAN-ODT Stopped by: Felix Pacini, DO       TAKE these medications    amLODipine 2.5 MG tablet Commonly known as: NORVASC Take 1 tablet (2.5 mg total) by mouth daily. Started by: Felix Pacini, DO   azithromycin 1 % ophthalmic solution Commonly known as: AZASITE Apply 1 drop to eye 2 (two) times daily for 5 days.   b complex vitamins capsule Take by mouth.   EPINEPHrine 0.3 mg/0.3 mL Soaj injection Commonly known as: EPI-PEN Inject 0.3 mg into the muscle as needed for anaphylaxis.   estradiol 1 MG tablet Commonly known as: ESTRACE Take 1 mg by mouth daily.   medroxyPROGESTERone 2.5 MG tablet Commonly known as: PROVERA Take 2.5 mg by mouth daily.   propranolol ER 80 MG 24 hr capsule Commonly known as: INDERAL LA Take 1 capsule (80 mg total) by mouth daily.   valACYclovir 500 MG tablet Commonly known as: VALTREX Take 500 mg by mouth 2 (two) times daily.        All past medical history, surgical history, allergies, family history,  immunizations andmedications were updated in the EMR today and reviewed under the history and medication portions of their EMR.     ROS Negative, with the exception of above  mentioned in HPI   Objective:  BP 132/88 (BP Location: Left Arm, Patient Position: Sitting, Cuff Size: Normal)   Pulse 98   Temp 98.7 F (37.1 C) (Oral)   Ht 5\' 6"  (1.676 m)   Wt 146 lb (66.2 kg)   LMP 04/18/2019 (Approximate)   SpO2 100%   BMI 23.57 kg/m  Body mass index is 23.57 kg/m. Physical Exam Vitals and nursing note reviewed.  Constitutional:      General: She is not in acute distress.    Appearance: Normal appearance. She is normal weight. She is not ill-appearing or toxic-appearing.  HENT:     Head: Normocephalic and atraumatic.  Eyes:     General:        Right eye: No discharge.        Left eye: No discharge.     Extraocular Movements: Extraocular movements intact.     Conjunctiva/sclera: Conjunctivae normal.     Pupils: Pupils are equal, round, and reactive to light.  Cardiovascular:     Rate and Rhythm: Normal rate and regular rhythm.     Heart sounds: No murmur heard. Pulmonary:     Effort: Pulmonary effort is normal.     Breath sounds: Normal breath sounds.  Musculoskeletal:     Cervical back: Neck supple.     Right lower leg: No edema.     Left lower leg: No edema.  Lymphadenopathy:     Cervical: No cervical adenopathy.  Skin:    Findings: No rash.  Neurological:     Mental Status: She is alert and oriented to person, place, and time. Mental status is at baseline.     Motor: Tremor present.  Psychiatric:        Mood and Affect: Mood normal.        Behavior: Behavior normal.        Thought Content: Thought content normal.        Judgment: Judgment normal.     No results found. No results found. No results found for this or any previous visit (from the past 24 hour(s)).  Assessment/Plan: Leah Jones is a 62 y.o. female present for OV for  Tremor Tremor has improved  with propranolol 80 mg daily. She has establishment in November with movement disorder specialist. Continue propranolol 80 daily  Hypertension Stable  continue propranolol 80 ER daily Start amlodipine 2.5 mg qd  Abnormal SPEP at allergist. 09/2022. It appears she was to have heme referral, but do not see a referral has been placed. Pt thought she was to wait until labs collected last week get resulted. Referral to Hematology placed today. She will be speaking to her allergy team tomorrow and clarify if referral is needed.   Reviewed expectations re: course of current medical issues. Discussed self-management of symptoms. Outlined signs and symptoms indicating need for more acute intervention. Patient verbalized understanding and all questions were answered. Patient received an After-Visit Summary.    Orders Placed This Encounter  Procedures   Ambulatory referral to Hematology / Oncology   Meds ordered this encounter  Medications   propranolol ER (INDERAL LA) 80 MG 24 hr capsule    Sig: Take 1 capsule (80 mg total) by mouth daily.    Dispense:  90 capsule    Refill:  1   amLODipine (NORVASC) 2.5 MG tablet    Sig: Take 1 tablet (2.5 mg total) by mouth daily.    Dispense:  90 tablet    Refill:  1  azithromycin (AZASITE) 1 % ophthalmic solution    Sig: Apply 1 drop to eye 2 (two) times daily for 5 days.    Dispense:  2.5 mL    Refill:  0   Referral Orders         Ambulatory referral to Hematology / Oncology        Note is dictated utilizing voice recognition software. Although note has been proof read prior to signing, occasional typographical errors still can be missed. If any questions arise, please do not hesitate to call for verification.   electronically signed by:  Felix Pacini, DO  [facility] [place]

## 2022-11-20 NOTE — Telephone Encounter (Signed)
Refill sent on 04/11 with 14 day supply. Pt cancelled appt for 04/22. This refill would have lasted her two days after scheduled appt

## 2022-11-21 ENCOUNTER — Telehealth: Payer: Self-pay

## 2022-11-21 NOTE — Telephone Encounter (Signed)
Per lab results on 10/08/22 per Dr.Kim please refer patient to heme/onc - new onset allergic reactions with angioedema. SPEP showed M spike.

## 2022-11-21 NOTE — Telephone Encounter (Signed)
Patient called the office as she wanted clarification on her labs. She called in stating she had received a phone call from the cancer center and was scared about it. I informed patient of labs that Dr.Kim has resulted in March and I also informed of her recent lab results. Patient wanted to know why she was being sent to a cancer center. I informed patient she was being sent to hem/onc to rule out some things and to do further workup. I informed patient that the labs were borderline to proteins in her body.

## 2022-11-22 NOTE — Telephone Encounter (Signed)
Dr Dorann Ou placed the referral on 11/20/2022. Patient has been called twice by oncology to get scheduled. Im going to close out this encounter.

## 2022-12-09 ENCOUNTER — Other Ambulatory Visit: Payer: Self-pay | Admitting: Family

## 2022-12-09 DIAGNOSIS — R778 Other specified abnormalities of plasma proteins: Secondary | ICD-10-CM

## 2022-12-10 ENCOUNTER — Encounter: Payer: Self-pay | Admitting: Family Medicine

## 2022-12-10 ENCOUNTER — Ambulatory Visit (INDEPENDENT_AMBULATORY_CARE_PROVIDER_SITE_OTHER): Payer: PRIVATE HEALTH INSURANCE | Admitting: Family Medicine

## 2022-12-10 VITALS — BP 135/95 | HR 78 | Temp 98.2°F | Wt 143.8 lb

## 2022-12-10 DIAGNOSIS — H6121 Impacted cerumen, right ear: Secondary | ICD-10-CM

## 2022-12-10 MED ORDER — AMLODIPINE BESYLATE 2.5 MG PO TABS: 2.5000 mg | ORAL_TABLET | Freq: Two times a day (BID) | ORAL | 1 refills | Status: DC

## 2022-12-10 MED ORDER — DEBROX 6.5 % OT SOLN: 5.0000 [drp] | Freq: Two times a day (BID) | OTIC | 1 refills | Status: DC | PRN

## 2022-12-10 NOTE — Progress Notes (Signed)
Leah Jones , 09-Dec-1960, 62 y.o., female MRN: [id number] Patient Care Team    Relationship Specialty Notifications Start End  Natalia Leatherwood, DO PCP - General Family Medicine  06/19/15   Obgyn, Ma Hillock    03/05/22     Chief Complaint  Patient presents with   Ear Clogged    Hearing has gotten progressively worse in the right ear. Did feel some discomfort and pain in the last 2 days.      Subjective: Leah Jones is a 62 y.o. Pt presents for an OV with complaints of right ear discomfort that has become worse of 2 days duration.  Associated symptoms include decreased hearing. Did not have hearing test at audiology. She did go to [facility] yesterday and they could not complete bc of cerumen impaction. She did try to perform wicking last night.      12/10/2022    2:09 PM 11/20/2022    2:15 PM 03/05/2022    9:03 AM 12/12/2020    3:08 PM 12/05/2020    1:13 PM  Depression screen PHQ 2/9  Decreased Interest  0 0 0 0  Down, Depressed, Hopeless 0 0 0 0 0  PHQ - 2 Score 0 0 0 0 0  Altered sleeping 0 3     Tired, decreased energy 0 2     Change in appetite 0 0     Feeling bad or failure about yourself  0 0     Trouble concentrating 0 3     Moving slowly or fidgety/restless 0 2     Suicidal thoughts 0 0     PHQ-9 Score 0 10     Difficult doing work/chores Not difficult at all Somewhat difficult       Allergies  Allergen Reactions   Bactrim [Sulfamethoxazole-Trimethoprim] Anaphylaxis   Sulfa Antibiotics Anaphylaxis   Lisinopril     ? 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 alcohol related   Alcohol dependence, daily use (HCC) 11/17/2018   Alcoholism (HCC)    per record   Allergic reaction 09/24/2022   ANAL FISSURE, HX OF 04/15/2007   Annotation:  occasional rectal bleeding Qualifier: Diagnosis of  By: Drue Novel MD, Nolon Rod.    Angio-edema    B12 deficiency    Carpal tunnel syndrome    Carpal tunnel syndrome 12/01/2009   Qualifier: Diagnosis of  By: Drue Novel MD, Jose E.    COVID-19 virus infection 08/23/2019   Diarrhea 11/17/2018   Discoloration of skin of foot 05/20/2014   Purplish discoloration top of L foot    Eczema    Eustachian tube dysfunction 01/25/2014   Mallet deformity of fifth finger, left, acquired 06/19/2015   Suicidal ideation    Urticaria    Past Surgical History:  Procedure Laterality Date   COLPOSCOPY  2020   pt reported benign.    FOOT SURGERY Left    Morton's neuroma, Dr Charlsie Merles   RHINOPLASTY     TONSILLECTOMY     Family History  Problem Relation Age of Onset   Asthma Mother    Colon cancer Mother    Breast cancer Mother    Celiac disease Mother    Alcoholism Father    Asthma  Sister    Anxiety disorder Sister    ADD / ADHD Sister    Bipolar disorder Brother    Pancreatic cancer Brother    Lung cancer Paternal Grandmother    Pancreatic cancer Paternal Grandfather    Asthma Son    Eczema Son    Bipolar disorder Son    Diabetes Other    Hypertension Other    Asthma Daughter    Allergies as of 12/10/2022       Reactions   Bactrim [sulfamethoxazole-trimethoprim] Anaphylaxis   Sulfa Antibiotics Anaphylaxis   Lisinopril    ? angioedema   Seroquel [quetiapine Fumarate] Other (See Comments)   Increased sedation on low dose and leg weakness        Medication List        Accurate as of Dec 10, 2022  2:42 PM. If you have any questions, ask your nurse or doctor.          amLODipine 2.5 MG tablet Commonly known as: NORVASC Take 1 tablet (2.5 mg total) by mouth in the morning and at bedtime. What changed: when to take this Changed by: Felix Pacini, DO   b complex vitamins capsule Take by mouth.   Debrox 6.5 % OTIC solution Generic drug: carbamide peroxide Place 5 drops into both ears 2  (two) times daily as needed. Started by: Felix Pacini, DO   EPINEPHrine 0.3 mg/0.3 mL Soaj injection Commonly known as: EPI-PEN Inject 0.3 mg into the muscle as needed for anaphylaxis.   estradiol 1 MG tablet Commonly known as: ESTRACE Take 1 mg by mouth daily.   medroxyPROGESTERone 2.5 MG tablet Commonly known as: PROVERA Take 2.5 mg by mouth daily.   propranolol ER 80 MG 24 hr capsule Commonly known as: INDERAL LA Take 1 capsule (80 mg total) by mouth daily.   valACYclovir 500 MG tablet Commonly known as: VALTREX Take 500 mg by mouth 2 (two) times daily.        All past medical history, surgical history, allergies, family history, immunizations andmedications were updated in the EMR today and reviewed under the history and medication portions of their EMR.     ROS Negative, with the exception of above mentioned in HPI   Objective:  BP (!) 135/95   Pulse 78   Temp 98.2 F (36.8 C)   Wt 143 lb 12.8 oz (65.2 kg)   LMP 04/18/2019 (Approximate)   SpO2 96%   BMI 23.21 kg/m  Body mass index is 23.21 kg/m. Physical Exam Vitals and nursing note reviewed.  Constitutional:      General: She is not in acute distress.    Appearance: Normal appearance. She is normal weight. She is not ill-appearing or toxic-appearing.  HENT:     Head: Normocephalic and atraumatic.     Right Ear: There is impacted cerumen.     Left Ear: Tympanic membrane, ear canal and external ear normal. There is no impacted cerumen.  Eyes:     General: No scleral icterus.       Right eye: No discharge.        Left eye: No discharge.     Extraocular Movements: Extraocular movements intact.     Conjunctiva/sclera: Conjunctivae normal.     Pupils: Pupils are equal, round, and reactive to light.  Skin:    Findings: No rash.  Neurological:     Mental Status: She is alert and oriented to person, place, and time. Mental status is at baseline.  Motor: No weakness.     Coordination: Coordination  normal.     Gait: Gait normal.  Psychiatric:        Mood and Affect: Mood normal.        Behavior: Behavior normal.        Thought Content: Thought content normal.        Judgment: Judgment normal.      No results found. No results found. No results found for this or any previous visit (from the past 24 hour(s)).  Assessment/Plan: Leah Jones is a 62 y.o. female present for OV for  Hearing loss of right ear due to cerumen impaction Procedure: Cerumen disimpaction Patient was verbally consented to procedure. Water-peroxide solution was applied and gentle ear lavage performed on right ear(s).  There were no complications.  Tympanic membrane was visualized after disimpaction.  Tympanic membrane(s) intact.  Auditory canal(s) WNL.  Patient tolerated procedure well.  Patient reported relief of symptoms after removal of cerumen. Debrox prn prescribed.   Reviewed expectations re: course of current medical issues. Discussed self-management of symptoms. Outlined signs and symptoms indicating need for more acute intervention. Patient verbalized understanding and all questions were answered. Patient received an After-Visit Summary.    No orders of the defined types were placed in this encounter.  Meds ordered this encounter  Medications   carbamide peroxide (DEBROX) 6.5 % OTIC solution    Sig: Place 5 drops into both ears 2 (two) times daily as needed.    Dispense:  15 mL    Refill:  1   amLODipine (NORVASC) 2.5 MG tablet    Sig: Take 1 tablet (2.5 mg total) by mouth in the morning and at bedtime.    Dispense:  180 tablet    Refill:  1   Referral Orders  No referral(s) requested today     Note is dictated utilizing voice recognition software. Although note has been proof read prior to signing, occasional typographical errors still can be missed. If any questions arise, please do not hesitate to call for verification.   electronically signed by:  Felix Pacini, DO  [facility] [place]

## 2022-12-10 NOTE — Patient Instructions (Addendum)
Return if symptoms worsen or fail to improve.   I did call in an ear solution to use at least a few times a month to help keep cerumen from building.      Great to see you today.

## 2022-12-11 ENCOUNTER — Inpatient Hospital Stay (HOSPITAL_BASED_OUTPATIENT_CLINIC_OR_DEPARTMENT_OTHER): Payer: No Typology Code available for payment source | Admitting: Family

## 2022-12-11 ENCOUNTER — Encounter: Payer: Self-pay | Admitting: Family

## 2022-12-11 ENCOUNTER — Inpatient Hospital Stay: Payer: No Typology Code available for payment source | Attending: Hematology & Oncology

## 2022-12-11 VITALS — BP 150/100 | HR 78 | Temp 99.1°F | Resp 18 | Wt 145.8 lb

## 2022-12-11 DIAGNOSIS — R778 Other specified abnormalities of plasma proteins: Secondary | ICD-10-CM

## 2022-12-11 DIAGNOSIS — F17210 Nicotine dependence, cigarettes, uncomplicated: Secondary | ICD-10-CM | POA: Insufficient documentation

## 2022-12-11 DIAGNOSIS — E538 Deficiency of other specified B group vitamins: Secondary | ICD-10-CM | POA: Diagnosis not present

## 2022-12-11 DIAGNOSIS — Z87440 Personal history of urinary (tract) infections: Secondary | ICD-10-CM | POA: Insufficient documentation

## 2022-12-11 DIAGNOSIS — Z79899 Other long term (current) drug therapy: Secondary | ICD-10-CM | POA: Diagnosis not present

## 2022-12-11 LAB — CBC WITH DIFFERENTIAL (CANCER CENTER ONLY)
Abs Immature Granulocytes: 0.01 10*3/uL (ref 0.00–0.07)
Basophils Absolute: 0.1 10*3/uL (ref 0.0–0.1)
Basophils Relative: 1 %
Eosinophils Absolute: 0.2 10*3/uL (ref 0.0–0.5)
Eosinophils Relative: 2 %
HCT: 38.3 % (ref 36.0–46.0)
Hemoglobin: 13 g/dL (ref 12.0–15.0)
Immature Granulocytes: 0 %
Lymphocytes Relative: 26 %
Lymphs Abs: 2.1 10*3/uL (ref 0.7–4.0)
MCH: 33.2 pg (ref 26.0–34.0)
MCHC: 33.9 g/dL (ref 30.0–36.0)
MCV: 97.7 fL (ref 80.0–100.0)
Monocytes Absolute: 1 10*3/uL (ref 0.1–1.0)
Monocytes Relative: 12 %
Neutro Abs: 4.7 10*3/uL (ref 1.7–7.7)
Neutrophils Relative %: 59 %
Platelet Count: 264 10*3/uL (ref 150–400)
RBC: 3.92 MIL/uL (ref 3.87–5.11)
RDW: 12 % (ref 11.5–15.5)
WBC Count: 8 10*3/uL (ref 4.0–10.5)
nRBC: 0 % (ref 0.0–0.2)

## 2022-12-11 LAB — CMP (CANCER CENTER ONLY)
ALT: 23 U/L (ref 0–44)
AST: 32 U/L (ref 15–41)
Albumin: 4.3 g/dL (ref 3.5–5.0)
Alkaline Phosphatase: 52 U/L (ref 38–126)
Anion gap: 10 (ref 5–15)
BUN: 8 mg/dL (ref 6–20)
CO2: 22 mmol/L (ref 22–32)
Calcium: 9.6 mg/dL (ref 8.9–10.3)
Chloride: 100 mmol/L (ref 98–111)
Creatinine: 0.72 mg/dL (ref 0.44–1.00)
GFR, Estimated: >60 mL/min (ref >60)
Glucose, Bld: 105 mg/dL — ABNORMAL HIGH (ref 70–99)
Potassium: 3.9 mmol/L (ref 3.5–5.1)
Sodium: 132 mmol/L — ABNORMAL LOW (ref 135–145)
Total Bilirubin: 0.9 mg/dL (ref 0.3–1.2)
Total Protein: 7.1 g/dL (ref 6.5–8.1)

## 2022-12-11 LAB — LACTATE DEHYDROGENASE: LDH: 162 U/L (ref 98–192)

## 2022-12-11 NOTE — Progress Notes (Signed)
Hematology/Oncology Consultation   Name: Leah Jones      MRN: [id number]    Location: Room/Leah info not found  Date: 12/11/2022 Time:3:15 PM   REFERRING PHYSICIAN:  Felix Pacini, DO  REASON FOR CONSULT:  Abnormal SPEP   DIAGNOSIS: Abnormal SPEP, work-up pending   HISTORY OF PRESENT ILLNESS: Leah Jones is a very pleasant 62 yo caucasian female with recent M-spike of 0.2 noted on work up s/p anaphylactic reaction to either a sulfa antibiotic (for UTI) or the flu shot which she had both in the same day. She states that she had developed uncontrolled diarrhea and then passed out with hypotension.  She has had no issue with fatigue or weakness.  No issue with infections.  No personal history of cancer. Her mother passed away from an unknown primary.  Mammogram in June 2023 was negative.  She states that her last colonoscopy was over 10 years ago and result was negative.  No history of diabetes or thyroid disease.  She has had dizziness the past 2 days after having her right ear irrigated to remove wax build up.  No fever, chills, n/v, cough, rash, SOB, chest pain, palpitations, abdominal pain or changes in bowel habits.  She has frequent urination 3-4 times a night.  No swelling in her extremities.  She does note tingling in her hands and feet when she misses doses of her daily B 12 deficiency.  No falls or syncope.  She smokes 5 cigarettes a day.  She drinks 2-3 glasses of wine and a couple of beers each evening.  No recreational drug use.  Appetite and hydration are fair. She admits that she skips eating lunch while at work and needs to better hydrate throughout the day. Weight is 145 lbs.   She enjoys walking for exercise and works for the [employer].   ROS: All other 10 point review of systems is negative.   PAST MEDICAL HISTORY:   Past Medical History:  Diagnosis Date   ADD (attention deficit disorder)    ? alcohol related   Alcohol dependence, daily use (HCC) 11/17/2018    Alcoholism (HCC)    per record   Allergic reaction 09/24/2022   ANAL FISSURE, HX OF 04/15/2007   Annotation: occasional rectal bleeding Qualifier: Diagnosis of  By: Leah Novel MD, Leah Jones.    Angio-edema    B12 deficiency    Carpal tunnel syndrome    Carpal tunnel syndrome 12/01/2009   Qualifier: Diagnosis of  By: Leah Novel MD, Leah E.    COVID-19 virus infection 08/23/2019   Diarrhea 11/17/2018   Discoloration of skin of foot 05/20/2014   Purplish discoloration top of L foot    Eczema    Eustachian tube dysfunction 01/25/2014   Mallet deformity of fifth finger, left, acquired 06/19/2015   Suicidal ideation    Urticaria     ALLERGIES: Allergies  Allergen Reactions   Bactrim [Sulfamethoxazole-Trimethoprim] Anaphylaxis   Sulfa Antibiotics Anaphylaxis   Lisinopril     ? angioedema   Seroquel [Quetiapine Fumarate] Other (See Comments)    Increased sedation on low dose and leg weakness      MEDICATIONS:  Current Outpatient Medications on File Prior to Visit  Medication Sig Dispense Refill   amLODipine (NORVASC) 2.5 MG tablet Take 1 tablet (2.5 mg total) by mouth in the morning and at bedtime. 180 tablet 1   b complex vitamins capsule Take by mouth.     carbamide peroxide (DEBROX) 6.5 % OTIC solution Place 5 drops  into both ears 2 (two) times daily as needed. 15 mL 1   EPINEPHrine 0.3 mg/0.3 mL IJ SOAJ injection Inject 0.3 mg into the muscle as needed for anaphylaxis. (Patient not taking: Reported on 11/20/2022) 2 each 1   estradiol (ESTRACE) 1 MG tablet Take 1 mg by mouth daily.     medroxyPROGESTERone (PROVERA) 2.5 MG tablet Take 2.5 mg by mouth daily.     propranolol ER (INDERAL LA) 80 MG 24 hr capsule Take 1 capsule (80 mg total) by mouth daily. 90 capsule 1   valACYclovir (VALTREX) 500 MG tablet Take 500 mg by mouth 2 (two) times daily. (Patient not taking: Reported on 11/20/2022)     No current facility-administered medications on file prior to visit.     PAST SURGICAL HISTORY Past  Surgical History:  Procedure Laterality Date   COLPOSCOPY  2020   pt reported benign.    FOOT SURGERY Left    Morton's neuroma, Dr Leah Jones   RHINOPLASTY     TONSILLECTOMY      FAMILY HISTORY: Family History  Problem Relation Age of Onset   Asthma Mother    Colon cancer Mother    Breast cancer Mother    Celiac disease Mother    Alcoholism Father    Asthma Sister    Anxiety disorder Sister    ADD / ADHD Sister    Bipolar disorder Brother    Pancreatic cancer Brother    Lung cancer Paternal Grandmother    Pancreatic cancer Paternal Grandfather    Asthma Son    Eczema Son    Bipolar disorder Son    Diabetes Other    Hypertension Other    Asthma Daughter     SOCIAL HISTORY:  reports that she has been smoking cigarettes. She has never used smokeless tobacco. She reports current alcohol use. She reports that she does not use drugs.  PERFORMANCE STATUS: The patient's performance status is 1 - Symptomatic but completely ambulatory  PHYSICAL EXAM: Most Recent Vital Signs: Last menstrual period 04/18/2019. LMP 04/18/2019 (Approximate)   General Appearance:    Alert, cooperative, no distress, appears stated age  Head:    Normocephalic, without obvious abnormality, atraumatic  Eyes:    PERRL, conjunctiva/corneas clear, EOM's intact, fundi    benign, both eyes        Throat:   Lips, mucosa, and tongue normal; teeth and gums normal  Neck:   Supple, symmetrical, trachea midline, no adenopathy;    thyroid:  no enlargement/tenderness/nodules; no carotid   bruit or JVD  Back:     Symmetric, no curvature, ROM normal, no CVA tenderness  Lungs:     Clear to auscultation bilaterally, respirations unlabored  Chest Wall:    No tenderness or deformity   Heart:    Regular rate and rhythm, S1 and S2 normal, no murmur, rub   or gallop     Abdomen:     Soft, non-tender, bowel sounds active all four quadrants,    no masses, no organomegaly        Extremities:   Extremities normal,  atraumatic, no cyanosis or edema  Pulses:   2+ and symmetric all extremities  Skin:   Skin color, texture, turgor normal, no rashes or lesions  Lymph nodes:   Cervical, supraclavicular, and axillary nodes normal  Neurologic:   CNII-XII intact, normal strength, sensation and reflexes    throughout    LABORATORY DATA:  Results for orders placed or performed in visit on 12/11/22 (from  the past 48 hour(s))  CBC with Differential (Cancer Center Only)     Status: None   Collection Time: 12/11/22  3:02 PM  Result Value Ref Range   WBC Count 8.0 4.0 - 10.5 K/uL   RBC 3.92 3.87 - 5.11 MIL/uL   Hemoglobin 13.0 12.0 - 15.0 g/dL   HCT 16.1 09.6 - 04.5 %   MCV 97.7 80.0 - 100.0 fL   MCH 33.2 26.0 - 34.0 pg   MCHC 33.9 30.0 - 36.0 g/dL   RDW 40.9 81.1 - 91.4 %   Platelet Count 264 150 - 400 K/uL   nRBC 0.0 0.0 - 0.2 %   Neutrophils Relative % 59 %   Neutro Abs 4.7 1.7 - 7.7 K/uL   Lymphocytes Relative 26 %   Lymphs Abs 2.1 0.7 - 4.0 K/uL   Monocytes Relative 12 %   Monocytes Absolute 1.0 0.1 - 1.0 K/uL   Eosinophils Relative 2 %   Eosinophils Absolute 0.2 0.0 - 0.5 K/uL   Basophils Relative 1 %   Basophils Absolute 0.1 0.0 - 0.1 K/uL   Immature Granulocytes 0 %   Abs Immature Granulocytes 0.01 0.00 - 0.07 K/uL    Comment: Performed at [facility] at [facility], [address] [place], [place] [address]      RADIOGRAPHY: No results found.     PATHOLOGY: None   ASSESSMENT/PLAN:  Ms. Duane is a very pleasant 62 yo caucasian female with recent M-spike of 0.2 noted on work up s/p anaphylactic reaction to either a sulfa antibiotic (for UTI) or the flu shot which she had both in the same day.  Serum protein studies are pending.  She was given supplies to collect a 24 hour urine specimen and will return to [place] as soon as she can.  Follow-up pending results.   All questions were answered. The patient knows to call the clinic with any problems, questions or  concerns. We can certainly see the patient much sooner if necessary.  The patient was discussed with Dr. Myna Hidalgo and he is in agreement with the aforementioned.   Eileen Stanford, NP

## 2022-12-12 LAB — KAPPA/LAMBDA LIGHT CHAINS
Kappa free light chain: 20.2 mg/L — ABNORMAL HIGH (ref 3.3–19.4)
Kappa, lambda light chain ratio: 0.74 (ref 0.26–1.65)
Lambda free light chains: 27.3 mg/L — ABNORMAL HIGH (ref 5.7–26.3)

## 2022-12-12 LAB — BETA 2 MICROGLOBULIN, SERUM: Beta-2 Microglobulin: 1.3 mg/L (ref 0.6–2.4)

## 2022-12-23 ENCOUNTER — Inpatient Hospital Stay: Payer: No Typology Code available for payment source | Attending: Hematology & Oncology

## 2022-12-23 DIAGNOSIS — R778 Other specified abnormalities of plasma proteins: Secondary | ICD-10-CM

## 2022-12-23 DIAGNOSIS — E538 Deficiency of other specified B group vitamins: Secondary | ICD-10-CM | POA: Diagnosis not present

## 2022-12-26 LAB — UPEP/UIFE/LIGHT CHAINS/TP, 24-HR UR
% BETA, Urine: 0 %
ALPHA 1 URINE: 0 %
Albumin, U: 0 %
Alpha 2, Urine: 0 %
Free Kappa Lt Chains,Ur: 3.76 mg/L (ref 1.17–86.46)
Free Kappa/Lambda Ratio: 3.55 (ref 1.83–14.26)
Free Lambda Lt Chains,Ur: 1.06 mg/L (ref 0.27–15.21)
GAMMA GLOBULIN URINE: 0 %
Total Protein, Urine-Ur/day: 118 mg/24 hr (ref 30–150)
Total Protein, Urine: 4.3 mg/dL
Total Volume: 2750

## 2022-12-27 ENCOUNTER — Telehealth: Payer: Self-pay | Admitting: Family

## 2022-12-27 NOTE — Telephone Encounter (Signed)
Left message with call back number to go over protein studies. Follow-up with lab in 4 months.

## 2022-12-31 ENCOUNTER — Telehealth: Payer: Self-pay | Admitting: Family

## 2022-12-31 ENCOUNTER — Other Ambulatory Visit: Payer: Self-pay | Admitting: Family

## 2022-12-31 DIAGNOSIS — R778 Other specified abnormalities of plasma proteins: Secondary | ICD-10-CM

## 2022-12-31 NOTE — Telephone Encounter (Signed)
I returned patient's call to discuss labs and didn't get an answer. Left voicemail with call back number.

## 2023-03-07 ENCOUNTER — Telehealth: Payer: Self-pay

## 2023-03-07 NOTE — Telephone Encounter (Signed)
Please Advise

## 2023-03-07 NOTE — Telephone Encounter (Signed)
Patient called to let Dr. Claiborne Billings know that the knots on the back of her neck have gotten bigger. She was told not to worry about them, they were nothing to be concerned about. Should she be concerned if they have increased in size.  Please call on Monday 8/19  [phone]

## 2023-03-10 NOTE — Telephone Encounter (Signed)
LVM for pt to return call.  Please assist patient with scheduling, thanks.

## 2023-03-10 NOTE — Telephone Encounter (Signed)
Please schedule patient an appointment if she has concerns in the lymph nodes have become larger.

## 2023-03-20 NOTE — Telephone Encounter (Signed)
Patient is scheduled on  9/18 with Dr. Claiborne Billings

## 2023-03-31 DIAGNOSIS — H5213 Myopia, bilateral: Secondary | ICD-10-CM | POA: Diagnosis not present

## 2023-03-31 DIAGNOSIS — H52223 Regular astigmatism, bilateral: Secondary | ICD-10-CM | POA: Diagnosis not present

## 2023-03-31 DIAGNOSIS — H01029 Squamous blepharitis unspecified eye, unspecified eyelid: Secondary | ICD-10-CM | POA: Diagnosis not present

## 2023-03-31 DIAGNOSIS — H47323 Drusen of optic disc, bilateral: Secondary | ICD-10-CM | POA: Diagnosis not present

## 2023-03-31 DIAGNOSIS — H04223 Epiphora due to insufficient drainage, bilateral lacrimal glands: Secondary | ICD-10-CM | POA: Diagnosis not present

## 2023-04-09 ENCOUNTER — Ambulatory Visit: Payer: No Typology Code available for payment source | Admitting: Family Medicine

## 2023-04-21 ENCOUNTER — Telehealth: Payer: Self-pay | Admitting: Medical Oncology

## 2023-04-21 NOTE — Telephone Encounter (Signed)
Left vm for pt regarding rescheduling appointment on 05/02/2023. Gave patient new appt date and time and provided name and call back number.

## 2023-04-22 ENCOUNTER — Ambulatory Visit: Payer: No Typology Code available for payment source | Admitting: Family Medicine

## 2023-05-02 ENCOUNTER — Ambulatory Visit: Payer: PRIVATE HEALTH INSURANCE | Admitting: Family

## 2023-05-02 ENCOUNTER — Inpatient Hospital Stay: Payer: 59

## 2023-05-06 ENCOUNTER — Ambulatory Visit (INDEPENDENT_AMBULATORY_CARE_PROVIDER_SITE_OTHER): Payer: 59 | Admitting: Family Medicine

## 2023-05-06 ENCOUNTER — Encounter: Payer: Self-pay | Admitting: Family Medicine

## 2023-05-06 VITALS — BP 140/88 | HR 85 | Temp 97.9°F | Wt 148.0 lb

## 2023-05-06 DIAGNOSIS — R251 Tremor, unspecified: Secondary | ICD-10-CM | POA: Diagnosis not present

## 2023-05-06 DIAGNOSIS — G5793 Unspecified mononeuropathy of bilateral lower limbs: Secondary | ICD-10-CM | POA: Diagnosis not present

## 2023-05-06 DIAGNOSIS — I10 Essential (primary) hypertension: Secondary | ICD-10-CM

## 2023-05-06 DIAGNOSIS — E041 Nontoxic single thyroid nodule: Secondary | ICD-10-CM

## 2023-05-06 MED ORDER — AMLODIPINE BESYLATE 5 MG PO TABS: 5.0000 mg | ORAL_TABLET | Freq: Every day | ORAL | 1 refills | Status: DC

## 2023-05-06 MED ORDER — PROPRANOLOL HCL ER 120 MG PO CP24: 120.0000 mg | ORAL_CAPSULE | Freq: Every day | ORAL | 1 refills | Status: DC

## 2023-05-06 MED ORDER — AMLODIPINE BESYLATE 5 MG PO TABS: 5.0000 mg | ORAL_TABLET | Freq: Two times a day (BID) | ORAL | 1 refills | Status: DC

## 2023-05-06 NOTE — Progress Notes (Signed)
Leah Jones , [birth year], 62 y.o., female MRN: [id number] Patient Care Team    Relationship Specialty Notifications Start End  Natalia Leatherwood, DO PCP - General Family Medicine  06/19/15   Obgyn, Ma Hillock    03/05/22     Chief Complaint  Patient presents with   Edema    Lumps on neck has gotten bigger noticed a few weeks ago; does not taken bp med in PM     Subjective: Leah Jones is a 62 y.o. Pt presents for an OV with complaints of areas of her anterior neck that are concerning to her.  She feels they are masses that have become larger over the last month.  She has had a history of a thyroid nodule/cyst in the past.  Hypertension/tremor/panic: Pt reports compliance with Inderal 80 mg daily and amlodipine 2.5 mg daily Patient denies chest pain, shortness of breath, dizziness or lower extremity edema.  She has noticed an increase in her hand tremors Prior note Pt presents for an OV with complaints of hand tremors, shaky voice and head movement involuntarily.  Patient states she has noticed it has become more noticeable over the last 2 months.  She feels maybe it improves after she eats.  She has a past medical history of anxiety, panic attack, B12 deficiency and alcohol dependence.  She is having numbness and tingling in her fingers and toes.  She has a known B12 deficiency.  She states that she gets back in the habit of taking her B vitamins it can improve the feeling in her extremities.  She had been able to cut back on her alcohol use in the past, but admits she is drinking alcohol daily again.  Neuropathy: Patient reports the neuropathy in her feet has worsened.  She reports she is taking her B complex vitamin.       12/10/2022    2:09 PM 11/20/2022    2:15 PM 03/05/2022    9:03 AM 12/12/2020    3:08 PM 12/05/2020    1:13 PM  Depression screen PHQ 2/9  Decreased Interest  0 0 0 0  Down, Depressed, Hopeless 0 0 0 0 0  PHQ - 2 Score 0 0 0 0 0  Altered sleeping 0 3      Tired, decreased energy 0 2     Change in appetite 0 0     Feeling bad or failure about yourself  0 0     Trouble concentrating 0 3     Moving slowly or fidgety/restless 0 2     Suicidal thoughts 0 0     PHQ-9 Score 0 10     Difficult doing work/chores Not difficult at all Somewhat difficult       Allergies  Allergen Reactions   Bactrim [Sulfamethoxazole-Trimethoprim] Anaphylaxis   Sulfa Antibiotics Anaphylaxis   Lisinopril     ? 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 alcohol related   Alcohol dependence, daily use (HCC) 11/17/2018   Alcoholism (HCC)    per record   Allergic reaction 09/24/2022   ANAL FISSURE, HX OF 04/15/2007   Annotation: occasional rectal bleeding Qualifier: Diagnosis of  By: Drue Novel MD, Nolon Rod.    Angio-edema  B12 deficiency    Carpal tunnel syndrome    Carpal tunnel syndrome 12/01/2009   Qualifier: Diagnosis of  By: Drue Novel MD, Jose E.    COVID-19 virus infection 08/23/2019   Diarrhea 11/17/2018   Discoloration of skin of foot 05/20/2014   Purplish discoloration top of L foot    Eczema    Eustachian tube dysfunction 01/25/2014   Mallet deformity of fifth finger, left, acquired 06/19/2015   Suicidal ideation    Urticaria    Past Surgical History:  Procedure Laterality Date   COLPOSCOPY  2020   pt reported benign.    FOOT SURGERY Left    Morton's neuroma, Dr Charlsie Merles   RHINOPLASTY     TONSILLECTOMY     Family History  Problem Relation Age of Onset   Asthma Mother    Colon cancer Mother    Breast cancer Mother    Celiac disease Mother    Alcoholism Father    Asthma Sister    Anxiety disorder Sister    ADD / ADHD Sister    Bipolar disorder Brother    Pancreatic cancer Brother    Lung cancer Paternal Grandmother     Pancreatic cancer Paternal Grandfather    Asthma Son    Eczema Son    Bipolar disorder Son    Diabetes Other    Hypertension Other    Asthma Daughter    Allergies as of 05/06/2023       Reactions   Bactrim [sulfamethoxazole-trimethoprim] Anaphylaxis   Sulfa Antibiotics Anaphylaxis   Lisinopril    ? angioedema   Seroquel [quetiapine Fumarate] Other (See Comments)   Increased sedation on low dose and leg weakness        Medication List        Accurate as of May 06, 2023 11:59 PM. If you have any questions, ask your nurse or doctor.          STOP taking these medications    Debrox 6.5 % OTIC solution Generic drug: carbamide peroxide Stopped by: Felix Pacini   valACYclovir 500 MG tablet Commonly known as: VALTREX Stopped by: Felix Pacini       TAKE these medications    amLODipine 5 MG tablet Commonly known as: NORVASC Take 1 tablet (5 mg total) by mouth daily. What changed:  medication strength how much to take when to take this Changed by: Felix Pacini   b complex vitamins capsule Take by mouth.   EPINEPHrine 0.3 mg/0.3 mL Soaj injection Commonly known as: EPI-PEN Inject 0.3 mg into the muscle as needed for anaphylaxis.   estradiol 1 MG tablet Commonly known as: ESTRACE Take 1 mg by mouth daily.   medroxyPROGESTERone 2.5 MG tablet Commonly known as: PROVERA Take 2.5 mg by mouth daily.   propranolol ER 120 MG 24 hr capsule Commonly known as: INDERAL LA Take 1 capsule (120 mg total) by mouth daily. What changed:  medication strength how much to take Changed by: Felix Pacini        All past medical history, surgical history, allergies, family history, immunizations andmedications were updated in the EMR today and reviewed under the history and medication portions of their EMR.     ROS Negative, with the exception of above mentioned in HPI   Objective:  BP (!) 140/88   Pulse 85   Temp 97.9 F (36.6 C)   Wt 148 lb (67.1 kg)    LMP 04/18/2019 (Approximate)   SpO2 98%   BMI 23.89 kg/m  Body mass index is 23.89 kg/m. Physical Exam Vitals and nursing note reviewed.  Constitutional:      General: She is not in acute distress.    Appearance: Normal appearance. She is not ill-appearing, toxic-appearing or diaphoretic.  HENT:     Head: Normocephalic and atraumatic.  Eyes:     General: No scleral icterus.       Right eye: No discharge.        Left eye: No discharge.     Extraocular Movements: Extraocular movements intact.     Conjunctiva/sclera: Conjunctivae normal.     Pupils: Pupils are equal, round, and reactive to light.  Cardiovascular:     Rate and Rhythm: Normal rate and regular rhythm.  Pulmonary:     Effort: Pulmonary effort is normal. No respiratory distress.     Breath sounds: Normal breath sounds. No wheezing, rhonchi or rales.  Musculoskeletal:     Right lower leg: No edema.     Left lower leg: No edema.  Skin:    General: Skin is warm.     Findings: No rash.  Neurological:     Mental Status: She is alert and oriented to person, place, and time. Mental status is at baseline.     Motor: No weakness.     Gait: Gait normal.     Comments: Bilateral hand tremor present  Psychiatric:        Mood and Affect: Mood normal.        Behavior: Behavior normal.        Thought Content: Thought content normal.        Judgment: Judgment normal.     No results found. No results found. No results found for this or any previous visit (from the past 24 hour(s)).  Assessment/Plan: Leah Jones is a 62 y.o. female present for OV for  Tremor Worsening Tremor head improved with propranolol 80 mg daily.  But recently noticed increasing tremors.  Will increase propranolol to 120 mg daily  Neuropathy: New problem Patient has bilateral feet neuropathy. She had an abnormal SPEP in the past-established with oncology in May She has had B vitamin deficiencies, and states she is supplementing. She has struggled  with alcohol dependence Referral to neurology placed  Hypertension Uncontrolled Above goal Increase propranolol 120 ER daily Increase amlodipine 5 mg daily Low-sodium diet. Routine exercise  Thyroid cyst: Patient concerned however anterior neck appearance.  She is concerned her thyroid nodule/cyst is getting larger. Thyroid ultrasound ordered to be certain. We discussed aging process and laxity of platysma bands can cause a similar appearance.  Reviewed expectations re: course of current medical issues. Discussed self-management of symptoms. Outlined signs and symptoms indicating need for more acute intervention. Patient verbalized understanding and all questions were answered. Patient received an After-Visit Summary.    Orders Placed This Encounter  Procedures   US THYROID   Ambulatory referral to Neurology   Meds ordered this encounter  Medications   DISCONTD: amLODipine (NORVASC) 5 MG tablet    Sig: Take 1 tablet (5 mg total) by mouth in the morning and at bedtime.    Dispense:  90 tablet    Refill:  1   amLODipine (NORVASC) 5 MG tablet    Sig: Take 1 tablet (5 mg total) by mouth daily.    Dispense:  90 tablet    Refill:  1    Use this script "daily"   propranolol ER (INDERAL LA) 120 MG 24 hr capsule    Sig:  Take 1 capsule (120 mg total) by mouth daily.    Dispense:  90 capsule    Refill:  1   Referral Orders         Ambulatory referral to Neurology       Note is dictated utilizing voice recognition software. Although note has been proof read prior to signing, occasional typographical errors still can be missed. If any questions arise, please do not hesitate to call for verification.   electronically signed by:  Felix Pacini, DO  [facility] [place]

## 2023-05-08 ENCOUNTER — Inpatient Hospital Stay: Payer: 59 | Attending: Hematology & Oncology

## 2023-05-08 ENCOUNTER — Inpatient Hospital Stay: Payer: 59 | Admitting: Medical Oncology

## 2023-05-09 DIAGNOSIS — G5793 Unspecified mononeuropathy of bilateral lower limbs: Secondary | ICD-10-CM | POA: Insufficient documentation

## 2023-05-12 ENCOUNTER — Encounter: Payer: Self-pay | Admitting: Medical Oncology

## 2023-05-15 ENCOUNTER — Other Ambulatory Visit: Payer: Self-pay | Admitting: Family Medicine

## 2023-05-20 ENCOUNTER — Ambulatory Visit (HOSPITAL_BASED_OUTPATIENT_CLINIC_OR_DEPARTMENT_OTHER): Payer: 59

## 2023-06-10 DIAGNOSIS — Z124 Encounter for screening for malignant neoplasm of cervix: Secondary | ICD-10-CM | POA: Diagnosis not present

## 2023-06-10 DIAGNOSIS — Z01419 Encounter for gynecological examination (general) (routine) without abnormal findings: Secondary | ICD-10-CM | POA: Diagnosis not present

## 2023-06-10 DIAGNOSIS — Z01411 Encounter for gynecological examination (general) (routine) with abnormal findings: Secondary | ICD-10-CM | POA: Diagnosis not present

## 2023-06-10 DIAGNOSIS — Z1231 Encounter for screening mammogram for malignant neoplasm of breast: Secondary | ICD-10-CM | POA: Diagnosis not present

## 2023-06-10 DIAGNOSIS — Z1331 Encounter for screening for depression: Secondary | ICD-10-CM | POA: Diagnosis not present

## 2023-06-10 DIAGNOSIS — Z113 Encounter for screening for infections with a predominantly sexual mode of transmission: Secondary | ICD-10-CM | POA: Diagnosis not present

## 2023-06-10 LAB — HM MAMMOGRAPHY

## 2023-06-30 DIAGNOSIS — H04213 Epiphora due to excess lacrimation, bilateral lacrimal glands: Secondary | ICD-10-CM | POA: Diagnosis not present

## 2023-06-30 DIAGNOSIS — H04552 Acquired stenosis of left nasolacrimal duct: Secondary | ICD-10-CM | POA: Diagnosis not present

## 2023-06-30 DIAGNOSIS — H04561 Stenosis of right lacrimal punctum: Secondary | ICD-10-CM | POA: Diagnosis not present

## 2023-07-02 ENCOUNTER — Ambulatory Visit (HOSPITAL_BASED_OUTPATIENT_CLINIC_OR_DEPARTMENT_OTHER)
Admission: RE | Admit: 2023-07-02 | Discharge: 2023-07-02 | Disposition: A | Discharge status: Home / Self Care | Payer: 59 | Source: Ambulatory Visit | Attending: Family Medicine | Admitting: Family Medicine

## 2023-07-02 DIAGNOSIS — E041 Nontoxic single thyroid nodule: Secondary | ICD-10-CM | POA: Insufficient documentation

## 2023-07-02 DIAGNOSIS — I10 Essential (primary) hypertension: Secondary | ICD-10-CM | POA: Insufficient documentation

## 2023-07-02 DIAGNOSIS — R251 Tremor, unspecified: Secondary | ICD-10-CM | POA: Diagnosis not present

## 2023-07-10 DIAGNOSIS — H01029 Squamous blepharitis unspecified eye, unspecified eyelid: Secondary | ICD-10-CM | POA: Diagnosis not present

## 2023-07-10 DIAGNOSIS — H04223 Epiphora due to insufficient drainage, bilateral lacrimal glands: Secondary | ICD-10-CM | POA: Diagnosis not present

## 2023-07-10 DIAGNOSIS — B308 Other viral conjunctivitis: Secondary | ICD-10-CM | POA: Diagnosis not present

## 2023-07-10 DIAGNOSIS — H52223 Regular astigmatism, bilateral: Secondary | ICD-10-CM | POA: Diagnosis not present

## 2023-07-10 DIAGNOSIS — H47323 Drusen of optic disc, bilateral: Secondary | ICD-10-CM | POA: Diagnosis not present

## 2023-07-28 DIAGNOSIS — H01029 Squamous blepharitis unspecified eye, unspecified eyelid: Secondary | ICD-10-CM | POA: Diagnosis not present

## 2023-07-28 DIAGNOSIS — H04223 Epiphora due to insufficient drainage, bilateral lacrimal glands: Secondary | ICD-10-CM | POA: Diagnosis not present

## 2023-07-28 DIAGNOSIS — H52223 Regular astigmatism, bilateral: Secondary | ICD-10-CM | POA: Diagnosis not present

## 2023-07-28 DIAGNOSIS — H47323 Drusen of optic disc, bilateral: Secondary | ICD-10-CM | POA: Diagnosis not present

## 2023-08-04 DIAGNOSIS — F1020 Alcohol dependence, uncomplicated: Secondary | ICD-10-CM | POA: Diagnosis not present

## 2023-08-05 DIAGNOSIS — F1120 Opioid dependence, uncomplicated: Secondary | ICD-10-CM | POA: Diagnosis not present

## 2023-08-11 DIAGNOSIS — H9392 Unspecified disorder of left ear: Secondary | ICD-10-CM | POA: Diagnosis not present

## 2023-08-15 DIAGNOSIS — F1020 Alcohol dependence, uncomplicated: Secondary | ICD-10-CM | POA: Diagnosis not present

## 2023-08-19 DIAGNOSIS — F1020 Alcohol dependence, uncomplicated: Secondary | ICD-10-CM | POA: Diagnosis not present

## 2023-08-26 DIAGNOSIS — F1020 Alcohol dependence, uncomplicated: Secondary | ICD-10-CM | POA: Diagnosis not present

## 2023-09-09 DIAGNOSIS — F1020 Alcohol dependence, uncomplicated: Secondary | ICD-10-CM | POA: Diagnosis not present

## 2023-09-11 DIAGNOSIS — F1020 Alcohol dependence, uncomplicated: Secondary | ICD-10-CM | POA: Diagnosis not present

## 2023-09-15 DIAGNOSIS — F1020 Alcohol dependence, uncomplicated: Secondary | ICD-10-CM | POA: Diagnosis not present

## 2023-09-17 DIAGNOSIS — F1020 Alcohol dependence, uncomplicated: Secondary | ICD-10-CM | POA: Diagnosis not present

## 2023-09-19 ENCOUNTER — Encounter: Payer: Self-pay | Admitting: Gastroenterology

## 2023-09-19 ENCOUNTER — Telehealth: Payer: Self-pay

## 2023-09-19 NOTE — Telephone Encounter (Signed)
 Reason for CRM: Patient states she had imaging done in regards to a lump she had on her neck, and she never received the results from it. Patient is requesting a call back at [phone]   Pt given results.

## 2023-09-24 DIAGNOSIS — H04552 Acquired stenosis of left nasolacrimal duct: Secondary | ICD-10-CM | POA: Diagnosis not present

## 2023-09-24 DIAGNOSIS — H04542 Stenosis of left lacrimal canaliculi: Secondary | ICD-10-CM | POA: Diagnosis not present

## 2023-09-24 HISTORY — PX: OTHER SURGICAL HISTORY: SHX169

## 2023-10-02 ENCOUNTER — Ambulatory Visit (AMBULATORY_SURGERY_CENTER): Payer: 59

## 2023-10-02 VITALS — Ht 66.0 in | Wt 130.0 lb

## 2023-10-02 DIAGNOSIS — Z1211 Encounter for screening for malignant neoplasm of colon: Secondary | ICD-10-CM

## 2023-10-02 MED ORDER — SUFLAVE 178.7 G PO SOLR: 1.0000 | Freq: Once | ORAL | 0 refills | Status: AC

## 2023-10-02 NOTE — Progress Notes (Signed)

## 2023-10-09 DIAGNOSIS — F1021 Alcohol dependence, in remission: Secondary | ICD-10-CM | POA: Diagnosis not present

## 2023-10-14 ENCOUNTER — Encounter: Payer: Self-pay | Admitting: Gastroenterology

## 2023-10-14 DIAGNOSIS — F1021 Alcohol dependence, in remission: Secondary | ICD-10-CM | POA: Diagnosis not present

## 2023-10-16 ENCOUNTER — Ambulatory Visit (AMBULATORY_SURGERY_CENTER): Payer: PRIVATE HEALTH INSURANCE | Admitting: Gastroenterology

## 2023-10-16 ENCOUNTER — Encounter: Payer: Self-pay | Admitting: Gastroenterology

## 2023-10-16 VITALS — BP 153/95 | HR 99 | Temp 97.2°F | Resp 17 | Ht 66.0 in | Wt 130.0 lb

## 2023-10-16 DIAGNOSIS — D125 Benign neoplasm of sigmoid colon: Secondary | ICD-10-CM

## 2023-10-16 DIAGNOSIS — Z1211 Encounter for screening for malignant neoplasm of colon: Secondary | ICD-10-CM | POA: Diagnosis not present

## 2023-10-16 DIAGNOSIS — D124 Benign neoplasm of descending colon: Secondary | ICD-10-CM

## 2023-10-16 MED ORDER — SODIUM CHLORIDE 0.9 % IV SOLN: 500.0000 mL | Freq: Once | INTRAVENOUS | Status: DC

## 2023-10-16 NOTE — Progress Notes (Signed)
 Vss nad trans to pacu

## 2023-10-16 NOTE — Progress Notes (Signed)
 Pt's states no medical or surgical changes since previsit or office visit.

## 2023-10-16 NOTE — Patient Instructions (Signed)

## 2023-10-16 NOTE — Progress Notes (Signed)
 Called to room to assist during endoscopic procedure.  Patient ID and intended procedure confirmed with present staff. Received instructions for my participation in the procedure from the performing physician.

## 2023-10-16 NOTE — Progress Notes (Signed)
 [facility] History and Physical   Primary Care Physician:  Natalia Leatherwood, DO   Reason for Procedure:   Colon cancer screening  Plan:    Screening colonoscopy     HPI: Leah Jones is a 63 y.o. female undergoing average risk screening colonoscopy.  She has no family history of colon cancer and no chronic GI symptoms.  She underwent a colonoscopy in 2012 to evaluate rectal bleeding and diarrhea which was unremarkable.    Past Medical History:  Diagnosis Date   ADD (attention deficit disorder)    ? alcohol related   Alcohol dependence, daily use (HCC) 11/17/2018   Alcoholism (HCC)    per record   Allergic reaction 09/24/2022   ANAL FISSURE, HX OF 04/15/2007   Annotation: occasional rectal bleeding Qualifier: Diagnosis of  By: Drue Novel MD, Nolon Rod.    Angio-edema    B12 deficiency    Carpal tunnel syndrome    Carpal tunnel syndrome 12/01/2009   Qualifier: Diagnosis of  By: Drue Novel MD, Jose E.    COVID-19 virus infection 08/23/2019   Diarrhea 11/17/2018   Discoloration of skin of foot 05/20/2014   Purplish discoloration top of L foot    Eczema    Eustachian tube dysfunction 01/25/2014   Mallet deformity of fifth finger, left, acquired 06/19/2015   Suicidal ideation    Urticaria     Past Surgical History:  Procedure Laterality Date   COLPOSCOPY  2020   pt reported benign.    FOOT SURGERY Left    Morton's neuroma, Dr Charlsie Merles   RHINOPLASTY     TONSILLECTOMY      Prior to Admission medications   Medication Sig Start Date End Date Taking? Authorizing Provider  amLODipine (NORVASC) 5 MG tablet Take 1 tablet (5 mg total) by mouth daily. 05/06/23  Yes Kuneff, Renee A, DO  atomoxetine (STRATTERA) 40 MG capsule Take by mouth. 09/05/23  Yes [provider]  b complex vitamins capsule Take by mouth.   Yes [provider]  cetirizine (ZYRTEC) 10 MG tablet Take by mouth. 08/11/23  Yes [provider]  estradiol (ESTRACE) 1 MG tablet Take 1 mg by  mouth daily. 09/15/18  Yes [provider]  fluticasone (CUTIVATE) 0.05 % cream SMARTSIG:Topical Morning-Night PRN 08/21/23  Yes [provider]  guanFACINE (TENEX) 1 MG tablet Take 1 mg by mouth at bedtime. 09/05/23  Yes [provider]  loteprednol (LOTEMAX) 0.5 % ophthalmic suspension Apply to eye. 07/11/23  Yes [provider]  magnesium oxide (MAG-OX) 400 MG tablet Take 1 tablet by mouth daily. 09/05/23  Yes [provider]  medroxyPROGESTERone (PROVERA) 2.5 MG tablet Take 2.5 mg by mouth daily.   Yes [provider]  Melatonin 3 MG SUBL Take 1 tablet by mouth at bedtime as needed. 09/05/23  Yes [provider]  naltrexone (DEPADE) 50 MG tablet Take 50 mg by mouth at bedtime. 09/05/23  Yes [provider]  neomycin-polymyxin b-dexamethasone (MAXITROL) 3.5-10000-0.1 SUSP SMARTSIG:In Eye(s)   Yes [provider]  propranolol ER (INDERAL LA) 120 MG 24 hr capsule Take 1 capsule (120 mg total) by mouth daily. 05/06/23  Yes Kuneff, Renee A, DO  EPINEPHrine 0.3 mg/0.3 mL IJ SOAJ injection Inject 0.3 mg into the muscle as needed for anaphylaxis. 08/23/22   Derwood Kaplan, MD    Current Outpatient Medications  Medication Sig Dispense Refill   amLODipine (NORVASC) 5 MG tablet Take 1 tablet (5 mg total) by mouth daily. 90 tablet 1  atomoxetine (STRATTERA) 40 MG capsule Take by mouth.     b complex vitamins capsule Take by mouth.     cetirizine (ZYRTEC) 10 MG tablet Take by mouth.     estradiol (ESTRACE) 1 MG tablet Take 1 mg by mouth daily.     fluticasone (CUTIVATE) 0.05 % cream SMARTSIG:Topical Morning-Night PRN     guanFACINE (TENEX) 1 MG tablet Take 1 mg by mouth at bedtime.     loteprednol (LOTEMAX) 0.5 % ophthalmic suspension Apply to eye.     magnesium oxide (MAG-OX) 400 MG tablet Take 1 tablet by mouth daily.     medroxyPROGESTERone (PROVERA) 2.5 MG tablet Take 2.5 mg by mouth daily.     Melatonin 3 MG SUBL Take 1  tablet by mouth at bedtime as needed.     naltrexone (DEPADE) 50 MG tablet Take 50 mg by mouth at bedtime.     neomycin-polymyxin b-dexamethasone (MAXITROL) 3.5-10000-0.1 SUSP SMARTSIG:In Eye(s)     propranolol ER (INDERAL LA) 120 MG 24 hr capsule Take 1 capsule (120 mg total) by mouth daily. 90 capsule 1   EPINEPHrine 0.3 mg/0.3 mL IJ SOAJ injection Inject 0.3 mg into the muscle as needed for anaphylaxis. 2 each 1   Current Facility-Administered Medications  Medication Dose Route Frequency Provider Last Rate Last Admin   0.9 %  sodium chloride infusion  500 mL Intravenous Once Jenel Lucks, MD        Allergies as of 10/16/2023 - Review Complete 10/16/2023  Allergen Reaction Noted   Bactrim [sulfamethoxazole-trimethoprim] Anaphylaxis 09/02/2022   Sulfa antibiotics Anaphylaxis 09/02/2022   Lisinopril Other (See Comments) 09/24/2022   Seroquel [quetiapine fumarate] Other (See Comments) 12/29/2018    Family History  Problem Relation Age of Onset   Stomach cancer Mother    Colon polyps Mother    Asthma Mother    Colon cancer Mother    Breast cancer Mother    Celiac disease Mother    Colon polyps Father    Alcoholism Father    Colon polyps Sister    Asthma Sister    Anxiety disorder Sister    ADD / ADHD Sister    Colon polyps Brother    Bipolar disorder Brother    Pancreatic cancer Brother    Lung cancer Paternal Grandmother    Pancreatic cancer Paternal Grandfather    Asthma Daughter    Asthma Son    Eczema Son    Bipolar disorder Son    Diabetes Other    Hypertension Other    Esophageal cancer Neg Hx    Rectal cancer Neg Hx     Social History   Socioeconomic History   Marital status: Divorced    Spouse name: Not on file   Number of children: 3   Years of education: Not on file   Highest education level: Not on file  Occupational History   Occupation: Public affairs consultant: [employer]  Tobacco Use   Smoking status: Some Days    Types:  Cigarettes   Smokeless tobacco: Never  Vaping Use   Vaping status: Never Used  Substance and Sexual Activity   Alcohol use: Yes    Comment: Drinking 6 days a week. Drinks a bottle of wine, beer, or liquor    Drug use: No   Sexual activity: Not on file  Other Topics Concern   Not on file  Social History Narrative   G5 P3, 3 miscarriages   Social Drivers of  Health   Financial Resource Strain: Not on file  Food Insecurity: No Food Insecurity (12/11/2022)   Hunger Vital Sign    Worried About Running Out of Food in the Last Year: Never true    Ran Out of Food in the Last Year: Never true  Transportation Needs: No Transportation Needs (12/11/2022)   PRAPARE - Administrator, Civil Service (Medical): No    Lack of Transportation (Non-Medical): No  Physical Activity: Not on file  Stress: Not on file  Social Connections: Not on file  Intimate Partner Violence: Not At Risk (12/11/2022)   Humiliation, Afraid, Rape, and Kick questionnaire    Fear of Current or Ex-Partner: No    Emotionally Abused: No    Physically Abused: No    Sexually Abused: No    Review of Systems:  All other review of systems negative except as mentioned in the HPI.  Physical Exam: Vital signs BP 115/70   Pulse (!) 129   Temp (!) 97.2 F (36.2 C) (Skin)   Ht 5\' 6"  (1.676 m)   Wt 130 lb (59 kg)   LMP 04/18/2019 (Approximate)   SpO2 100%   BMI 20.98 kg/m   General:   Alert,  Well-developed, well-nourished, pleasant and cooperative in NAD Airway:  Mallampati 2 Lungs:  Clear throughout to auscultation.   Heart:  Regular rate and rhythm; no murmurs, clicks, rubs,  or gallops. Abdomen:  Soft, nontender and nondistended. Normal bowel sounds.   Neuro/Psych:  Normal mood and affect. A and O x 3   Leah Jones E. Tomasa Rand, MD [facility]

## 2023-10-16 NOTE — Op Note (Signed)
 [facility] Patient Name: Leah Jones Procedure Date: 10/16/2023 10:29 AM MRN: [id number] Endoscopist: Lorin Picket E. Tomasa Rand , MD, [id number] Age: 63 Referring MD:  Date of Birth: [birth year] Gender: Female Account #: [id number] Procedure:                Colonoscopy Indications:              Screening for colorectal malignant neoplasm (last                            colonoscopy was more than 10 years ago) Medicines:                Monitored Anesthesia Care Procedure:                Pre-Anesthesia Assessment:                           - Prior to the procedure, a History and Physical                            was performed, and patient medications and                            allergies were reviewed. The patient's tolerance of                            previous anesthesia was also reviewed. The risks                            and benefits of the procedure and the sedation                            options and risks were discussed with the patient.                            All questions were answered, and informed consent                            was obtained. Prior Anticoagulants: The patient has                            taken no anticoagulant or antiplatelet agents. ASA                            Grade Assessment: II - A patient with mild systemic                            disease. After reviewing the risks and benefits,                            the patient was deemed in satisfactory condition to                            undergo the procedure.  After obtaining informed consent, the colonoscope                            was passed under direct vision. Throughout the                            procedure, the patient's blood pressure, pulse, and                            oxygen saturations were monitored continuously. The                            CF [id number] #[id number] was introduced through the anus                            and  advanced to the the cecum, identified by                            appendiceal orifice and ileocecal valve. The                            colonoscopy was performed without difficulty. The                            patient tolerated the procedure well. The quality                            of the bowel preparation was good. The ileocecal                            valve, appendiceal orifice, and rectum were                            photographed. The bowel preparation used was                            SUFLAVE via split dose instruction. Scope In: 10:50:19 AM Scope Out: 11:06:46 AM Scope Withdrawal Time: 0 hours 10 minutes 31 seconds  Total Procedure Duration: 0 hours 16 minutes 27 seconds  Findings:                 The perianal and digital rectal examinations were                            normal. Pertinent negatives include normal                            sphincter tone and no palpable rectal lesions.                           A 3 mm polyp was found in the descending colon. The                            polyp was sessile. The polyp was removed  with a                            cold snare. Resection and retrieval were complete.                            Estimated blood loss was minimal.                           A 4 mm polyp was found in the sigmoid colon. The                            polyp was sessile. The polyp was removed with a                            cold snare. Resection and retrieval were complete.                            Estimated blood loss was minimal.                           The exam was otherwise normal throughout the                            examined colon.                           The retroflexed view of the distal rectum and anal                            verge was normal and showed no anal or rectal                            abnormalities. Complications:            No immediate complications. Estimated Blood Loss:     Estimated blood loss was  minimal. Impression:               - One 3 mm polyp in the descending colon, removed                            with a cold snare. Resected and retrieved.                           - One 4 mm polyp in the sigmoid colon, removed with                            a cold snare. Resected and retrieved.                           - The distal rectum and anal verge are normal on                            retroflexion view. Recommendation:           -  Patient has a contact number available for                            emergencies. The signs and symptoms of potential                            delayed complications were discussed with the                            patient. Return to normal activities tomorrow.                            Written discharge instructions were provided to the                            patient.                           - Resume previous diet.                           - Continue present medications.                           - Await pathology results.                           - Repeat colonoscopy (date not yet determined) for                            surveillance based on pathology results. Tashonna Descoteaux E. Tomasa Rand, MD 10/16/2023 11:11:18 AM This report has been signed electronically.

## 2023-10-17 ENCOUNTER — Telehealth: Payer: Self-pay

## 2023-10-17 NOTE — Telephone Encounter (Signed)
  Follow up Call-     10/16/2023   10:24 AM  Call back number  Post procedure Call Back phone  # [phone]  Permission to leave phone message Yes     Patient questions:  Do you have a fever, pain , or abdominal swelling? No. Pain Score  0 *  Have you tolerated food without any problems? Yes.    Have you been able to return to your normal activities? Yes.    Do you have any questions about your discharge instructions: Diet   No. Medications  No. Follow up visit  No.  Do you have questions or concerns about your Care? No.  Actions: * If pain score is 4 or above: No action needed, pain <4.

## 2023-10-20 ENCOUNTER — Other Ambulatory Visit: Payer: Self-pay | Admitting: Family Medicine

## 2023-10-20 LAB — SURGICAL PATHOLOGY

## 2023-10-20 NOTE — Telephone Encounter (Signed)
 Copied from CRM [id number]. Topic: Clinical - Medication Refill >> Oct 20, 2023 10:31 AM Cammy Copa D wrote: Most Recent Primary Care Visit:  Provider: Felix Pacini A  Department: [facility]  Visit Type: OFFICE VISIT  Date: 05/06/2023  Medication: atomoxetine (STRATTERA) 40 MG capsule, naltrexone (DEPADE) 50 MG tablet, cetirizine (ZYRTEC) 10 MG tablet  Has the patient contacted their pharmacy? No (Agent: If no, request that the patient contact the pharmacy for the refill. If patient does not wish to contact the pharmacy document the reason why and proceed with request.)  (Agent: If yes, when and what did the pharmacy advise?)  Is this the correct pharmacy for this prescription? Yes If no, delete pharmacy and type the correct one.  This is the patient's preferred pharmacy:  [facility] [id number] - [place], [place] - [address] [place] [address] Phone: [phone] Fax: [phone]   Has the prescription been filled recently? No  Is the patient out of the medication? Yes  Has the patient been seen for an appointment in the last year OR does the patient have an upcoming appointment? Yes  Can we respond through MyChart? No  Agent: Please be advised that Rx refills may take up to 3 business days. We ask that you follow-up with your pharmacy.

## 2023-10-22 ENCOUNTER — Ambulatory Visit: Admitting: Family Medicine

## 2023-10-22 ENCOUNTER — Other Ambulatory Visit: Payer: Self-pay | Admitting: Family Medicine

## 2023-10-22 MED ORDER — NALTREXONE HCL 50 MG PO TABS: 50.0000 mg | ORAL_TABLET | Freq: Every day | ORAL | 0 refills | Status: DC

## 2023-10-22 NOTE — Telephone Encounter (Signed)
 Copied from CRM [id number]. Topic: Clinical - Prescription Issue >> Oct 22, 2023 12:50 PM Gurney Maxin H wrote: Reason for CRM: Patient is calling to ask is she can have enough of the naltrexone (DEPADE) 50 MG tablets to hold her until she comes in for her appointment with Renee on 4/9. Patient states she's starting to have the alcohol craving which isn't good and has been out of the medication since last week, please reach out to patient, thanks.  Vitalia [phone]

## 2023-10-27 ENCOUNTER — Encounter: Payer: Self-pay | Admitting: Gastroenterology

## 2023-10-27 NOTE — Progress Notes (Signed)
 Leah Jones,  The two polyps which I removed during your recent procedure were proven to be completely benign but are considered "pre-cancerous" polyps that MAY have grown into cancer if they had not been removed.  Studies shows that at least 20% of women over age 61 and 30% of men over age 15 have pre-cancerous polyps.  Based on current nationally recognized surveillance guidelines, I recommend that you have a repeat colonoscopy in 7 years.   If you develop any new rectal bleeding, abdominal pain or significant bowel habit changes, please contact me before then.

## 2023-10-29 ENCOUNTER — Telehealth (INDEPENDENT_AMBULATORY_CARE_PROVIDER_SITE_OTHER): Admitting: Family Medicine

## 2023-10-29 ENCOUNTER — Encounter: Payer: Self-pay | Admitting: Family Medicine

## 2023-10-29 ENCOUNTER — Ambulatory Visit: Admitting: Family Medicine

## 2023-10-29 DIAGNOSIS — G479 Sleep disorder, unspecified: Secondary | ICD-10-CM | POA: Diagnosis not present

## 2023-10-29 DIAGNOSIS — R41840 Attention and concentration deficit: Secondary | ICD-10-CM | POA: Diagnosis not present

## 2023-10-29 DIAGNOSIS — I10 Essential (primary) hypertension: Secondary | ICD-10-CM

## 2023-10-29 DIAGNOSIS — F4323 Adjustment disorder with mixed anxiety and depressed mood: Secondary | ICD-10-CM

## 2023-10-29 DIAGNOSIS — F410 Panic disorder [episodic paroxysmal anxiety] without agoraphobia: Secondary | ICD-10-CM | POA: Diagnosis not present

## 2023-10-29 DIAGNOSIS — F1021 Alcohol dependence, in remission: Secondary | ICD-10-CM | POA: Diagnosis not present

## 2023-10-29 DIAGNOSIS — G5793 Unspecified mononeuropathy of bilateral lower limbs: Secondary | ICD-10-CM

## 2023-10-29 DIAGNOSIS — F430 Acute stress reaction: Secondary | ICD-10-CM

## 2023-10-29 DIAGNOSIS — R251 Tremor, unspecified: Secondary | ICD-10-CM

## 2023-10-29 MED ORDER — AMLODIPINE BESYLATE 5 MG PO TABS: 5.0000 mg | ORAL_TABLET | Freq: Every day | ORAL | 1 refills | Status: DC

## 2023-10-29 MED ORDER — CETIRIZINE HCL 10 MG PO TABS: 10.0000 mg | ORAL_TABLET | Freq: Every day | ORAL | 3 refills | Status: DC

## 2023-10-29 MED ORDER — DULOXETINE HCL 60 MG PO CPEP: 60.0000 mg | ORAL_CAPSULE | Freq: Every day | ORAL | 1 refills | Status: DC

## 2023-10-29 MED ORDER — PROPRANOLOL HCL ER 120 MG PO CP24: 120.0000 mg | ORAL_CAPSULE | Freq: Every day | ORAL | 1 refills | Status: DC

## 2023-10-29 MED ORDER — GUANFACINE HCL 1 MG PO TABS: 1.0000 mg | ORAL_TABLET | Freq: Every day | ORAL | 1 refills | Status: DC

## 2023-10-29 MED ORDER — NALTREXONE HCL 50 MG PO TABS: 100.0000 mg | ORAL_TABLET | Freq: Every day | ORAL | 1 refills | Status: DC

## 2023-10-29 MED ORDER — ATOMOXETINE HCL 40 MG PO CAPS: 40.0000 mg | ORAL_CAPSULE | Freq: Every day | ORAL | 1 refills | Status: DC

## 2023-10-29 NOTE — Patient Instructions (Addendum)
 Return in about 4 weeks (around 11/26/2023) for Routine chronic condition follow-up- in person.        Great to see you today.  I have refilled the medication(s) we provide.   If labs were collected or images ordered, we will inform you of  results once we have received them and reviewed. We will contact you either by echart message, or telephone call.  Please give ample time to the testing facility, and our office to run,  receive and review results. Please do not call inquiring of results, even if you can see them in your chart. We will contact you as soon as we are able. If it has been over 1 week since the test was completed, and you have not yet heard from [place], then please call us.    - echart message- for normal results that have been seen by the patient already.   - telephone call: abnormal results or if patient has not viewed results in their echart.  If a referral to a specialist was entered for you, please call us in 2 weeks if you have not heard from the specialist office to schedule.

## 2023-10-29 NOTE — Progress Notes (Signed)
 VIRTUAL VISIT VIA VIDEO  I connected with Leah Jones on 11/03/23 at  9:40 AM EDT by a video enabled telemedicine application and verified that I am speaking with the correct person using two identifiers. Location patient: Home Location provider: [facility], Office Persons participating in the virtual visit: Patient, Dr. Marylee Snowball and Pressley Brome, CMA  I discussed the limitations of evaluation and management by telemedicine and the availability of in person appointments. The patient expressed understanding and agreed to proceed.      Leah Jones , [birth year], 63 y.o., female MRN: [id number] Patient Care Team    Relationship Specialty Notifications Start End  Mariel Shope, DO PCP - General Family Medicine  06/19/15   Obgyn, Corbin Dess    03/05/22     Chief Complaint  Patient presents with   Hypertension     Subjective: Leah Jones is a 63 y.o. Pt presents for an OV  Hypertension/tremor/panic: Pt reports compliance with Inderal 80 mg daily and amlodipine 5 mg daily Patient denies chest pain, shortness of breath, dizziness or lower extremity edema.   She has noticed an increase in her hand tremors Prior note Pt presents for an OV with complaints of hand tremors, shaky voice and head movement involuntarily.  Patient states she has noticed it has become more noticeable over the last 2 months.  She feels maybe it improves after she eats.  She has a past medical history of anxiety, panic attack, B12 deficiency and alcohol dependence.  She is having numbness and tingling in her fingers and toes.  She has a known B12 deficiency.  She states that she gets back in the habit of taking her B vitamins it can improve the feeling in her extremities.  She had been able to cut back on her alcohol use in the past, but admits she is drinking alcohol daily again.  Neuropathy: Patient reports the neuropathy in her feet has worsened.  She reports she is taking her B complex vitamin and  cymbalta. Panic attack as reaction to stress (Primary)/Adjustment disorder with mixed anxiety and depressed mood She has been out of her cymbalta for about a month. She endorses anxiety  Alcohol dependence in remission [facility]) Has been in remission.  Compliant with depade every day, wonders if she needs an increase in her dose to 100 mg every day.  Sleep disturbance Still having difficulty with sleep. She has not started the Intutiv.   Neuropathy of both feet Was taking cymbalta  ADD: Taking Strattera, had not started intuntiv.   Stopped  drinking, went [facility]. Dc mid feb.      12/10/2022    2:09 PM 11/20/2022    2:15 PM 03/05/2022    9:03 AM 12/12/2020    3:08 PM 12/05/2020    1:13 PM  Depression screen PHQ 2/9  Decreased Interest  0 0 0 0  Down, Depressed, Hopeless 0 0 0 0 0  PHQ - 2 Score 0 0 0 0 0  Altered sleeping 0 3     Tired, decreased energy 0 2     Change in appetite 0 0     Feeling bad or failure about yourself  0 0     Trouble concentrating 0 3     Moving slowly or fidgety/restless 0 2     Suicidal thoughts 0 0     PHQ-9 Score 0 10     Difficult doing work/chores Not difficult at all Somewhat difficult  Allergies  Allergen Reactions   Bactrim [Sulfamethoxazole-Trimethoprim] Anaphylaxis   Sulfa Antibiotics Anaphylaxis   Lisinopril Other (See Comments)    ? 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 alcohol related   Alcohol dependence, daily use (HCC) 11/17/2018   Alcoholism (HCC)    per record   Allergic reaction 09/24/2022   ANAL FISSURE, HX OF 04/15/2007   Annotation: occasional rectal bleeding Qualifier: Diagnosis of  By: Neomi Banks MD, Anitra Ket.    Angio-edema    B12 deficiency    Carpal tunnel syndrome    Carpal  tunnel syndrome 12/01/2009   Qualifier: Diagnosis of  By: Neomi Banks MD, Jose E.    COVID-19 virus infection 08/23/2019   Diarrhea 11/17/2018   Discoloration of skin of foot 05/20/2014   Purplish discoloration top of L foot    Eczema    Eustachian tube dysfunction 01/25/2014   Mallet deformity of fifth finger, left, acquired 06/19/2015   Suicidal ideation    Urticaria    Past Surgical History:  Procedure Laterality Date   COLPOSCOPY  2020   pt reported benign.    FOOT SURGERY Left    Morton's neuroma, Dr Lova Rua OF NASOLACRIMAL DUCT, SILICONE TUBE, WITH OR WITHOUT IRRIGATION; WITH INSERTION OF TUBE OR STENT  09/24/2023   RHINOPLASTY     TONSILLECTOMY     Family History  Problem Relation Age of Onset   Stomach cancer Mother    Colon polyps Mother    Asthma Mother    Colon cancer Mother    Breast cancer Mother    Celiac disease Mother    Colon polyps Father    Alcoholism Father    Colon polyps Sister    Asthma Sister    Anxiety disorder Sister    ADD / ADHD Sister    Colon polyps Brother    Bipolar disorder Brother    Pancreatic cancer Brother    Lung cancer Paternal Grandmother    Pancreatic cancer Paternal Grandfather    Asthma Daughter    Asthma Son    Eczema Son    Bipolar disorder Son    Diabetes Other    Hypertension Other    Esophageal cancer Neg Hx    Rectal cancer Neg Hx    Allergies as of 10/29/2023       Reactions   Bactrim [sulfamethoxazole-trimethoprim] Anaphylaxis   Sulfa Antibiotics Anaphylaxis   Lisinopril Other (See Comments)   ? angioedema   Seroquel [quetiapine Fumarate] Other (See Comments)   Increased sedation on low dose and leg weakness        Medication List        Accurate as of October 29, 2023 11:59 PM. If you have any questions, ask your nurse or doctor.          amLODipine 5 MG tablet Commonly known as: NORVASC Take 1 tablet (5 mg total) by mouth daily.   atomoxetine 40 MG capsule Commonly known as: STRATTERA Take 1  capsule (40 mg total) by mouth daily. What changed:  how much to take when to take this Changed by: Treysean Petruzzi   b complex vitamins capsule Take by mouth.   cetirizine 10 MG tablet Commonly known as: ZYRTEC Take 1  tablet (10 mg total) by mouth at bedtime. What changed:  how much to take when to take this Changed by: Napolean Backbone   DULoxetine 60 MG capsule Commonly known as: Cymbalta Take 1 capsule (60 mg total) by mouth daily. Started by: Marvis Bakken   EPINEPHrine 0.3 mg/0.3 mL Soaj injection Commonly known as: EPI-PEN Inject 0.3 mg into the muscle as needed for anaphylaxis.   estradiol 1 MG tablet Commonly known as: ESTRACE Take 1 mg by mouth daily.   fluticasone 0.05 % cream Commonly known as: CUTIVATE SMARTSIG:Topical Morning-Night PRN   guanFACINE 1 MG tablet Commonly known as: TENEX Take 1 tablet (1 mg total) by mouth at bedtime.   loteprednol 0.5 % ophthalmic suspension Commonly known as: LOTEMAX Apply to eye.   magnesium oxide 400 MG tablet Commonly known as: MAG-OX Take 1 tablet by mouth daily.   medroxyPROGESTERone 2.5 MG tablet Commonly known as: PROVERA Take 2.5 mg by mouth daily.   Melatonin 3 MG Subl Take 1 tablet by mouth at bedtime as needed.   naltrexone 50 MG tablet Commonly known as: DEPADE Take 2 tablets (100 mg total) by mouth at bedtime. What changed: how much to take Changed by: Napolean Backbone   neomycin-polymyxin b-dexamethasone 3.5-10000-0.1 Susp Commonly known as: MAXITROL SMARTSIG:In Eye(s)   propranolol ER 120 MG 24 hr capsule Commonly known as: INDERAL LA Take 1 capsule (120 mg total) by mouth daily.        All past medical history, surgical history, allergies, family history, immunizations andmedications were updated in the EMR today and reviewed under the history and medication portions of their EMR.     ROS Negative, with the exception of above mentioned in HPI   Objective:  LMP 04/18/2019 (Approximate)   There is no height or weight on file to calculate BMI. Physical Exam Vitals and nursing note reviewed.  Constitutional:      General: She is not in acute distress.    Appearance: Normal appearance. She is not ill-appearing, toxic-appearing or diaphoretic.  HENT:     Head: Normocephalic and atraumatic.  Eyes:     General: No scleral icterus.       Right eye: No discharge.        Left eye: No discharge.     Extraocular Movements: Extraocular movements intact.     Conjunctiva/sclera: Conjunctivae normal.     Pupils: Pupils are equal, round, and reactive to light.  Cardiovascular:     Rate and Rhythm: Normal rate and regular rhythm.  Pulmonary:     Effort: Pulmonary effort is normal. No respiratory distress.     Breath sounds: Normal breath sounds. No wheezing, rhonchi or rales.  Musculoskeletal:     Right lower leg: No edema.     Left lower leg: No edema.  Skin:    General: Skin is warm.     Findings: No rash.  Neurological:     Mental Status: She is alert and oriented to person, place, and time. Mental status is at baseline.     Motor: No weakness.     Gait: Gait normal.     Comments: Bilateral hand tremor present  Psychiatric:        Mood and Affect: Mood normal.        Behavior: Behavior normal.        Thought Content: Thought content normal.        Judgment: Judgment normal.     No results found. No results found. No results found  for this or any previous visit (from the past 24 hours).  Assessment/Plan: Leah Jones is a 64 y.o. female present for OV for Chronic Conditions/illness Management Tremor Continue Tremor head improved with propranolol 120 mg daily.    Neuropathy: Patient has bilateral feet neuropathy. She had an abnormal SPEP in the past-established with oncology in May 2024 She has had B vitamin deficiencies, and states she is supplementing. She has struggled with alcohol dependence> now in remission since mid-feb.  Referral to neurology had been  placed Restart cymblalta  Hypertension Last visit above goal, needs in person visit.  Continue  propranolol 120 ER daily Continue  amlodipine 5 mg daily Low-sodium diet. Routine exercise  Panic attack as reaction to stress (Primary)/Adjustment disorder with mixed anxiety and depressed mood Restart cymbalta  Alcohol dependence in remission (HCC) Alcohol free since med-feb 2025 Depade 50-100 mgs qd  Sleep disturbance In person visit after starting intuniv for ADD, which can cause drowsiness.   ADD: Continue Strattera Start intuniv, which was started in rehab.  Do not recommend stimulants with anxiety and sleep d/o  Reviewed expectations re: course of current medical issues. Discussed self-management of symptoms. Outlined signs and symptoms indicating need for more acute intervention. Patient verbalized understanding and all questions were answered. Patient received an After-Visit Summary.    No orders of the defined types were placed in this encounter.  Meds ordered this encounter  Medications   amLODipine (NORVASC) 5 MG tablet    Sig: Take 1 tablet (5 mg total) by mouth daily.    Dispense:  90 tablet    Refill:  1    Use this script "daily"   propranolol ER (INDERAL LA) 120 MG 24 hr capsule    Sig: Take 1 capsule (120 mg total) by mouth daily.    Dispense:  90 capsule    Refill:  1   naltrexone (DEPADE) 50 MG tablet    Sig: Take 2 tablets (100 mg total) by mouth at bedtime.    Dispense:  180 tablet    Refill:  1   atomoxetine (STRATTERA) 40 MG capsule    Sig: Take 1 capsule (40 mg total) by mouth daily.    Dispense:  90 capsule    Refill:  1   guanFACINE (TENEX) 1 MG tablet    Sig: Take 1 tablet (1 mg total) by mouth at bedtime.    Dispense:  90 tablet    Refill:  1   DULoxetine (CYMBALTA) 60 MG capsule    Sig: Take 1 capsule (60 mg total) by mouth daily.    Dispense:  90 capsule    Refill:  1   cetirizine (ZYRTEC) 10 MG tablet    Sig: Take 1 tablet (10  mg total) by mouth at bedtime.    Dispense:  90 tablet    Refill:  3   Referral Orders  No referral(s) requested today      Note is dictated utilizing voice recognition software. Although note has been proof read prior to signing, occasional typographical errors still can be missed. If any questions arise, please do not hesitate to call for verification.   electronically signed by:  Napolean Backbone, DO  [facility] [place]

## 2023-10-30 DIAGNOSIS — F1021 Alcohol dependence, in remission: Secondary | ICD-10-CM | POA: Diagnosis not present

## 2023-11-05 DIAGNOSIS — F1021 Alcohol dependence, in remission: Secondary | ICD-10-CM | POA: Diagnosis not present

## 2023-11-11 ENCOUNTER — Ambulatory Visit: Payer: Self-pay

## 2023-11-11 NOTE — Telephone Encounter (Signed)
  Chief Complaint: Fall, syncope Symptoms: fell, black eye Frequency: Fall X 1, near syncope is intermittent  Pertinent Negatives: Patient denies at this time weakness, near syncope, chest pain, difficulty breathing, headache, vision changes, and all other symptoms.  Disposition: [] ED /[] Urgent Care (no appt availability in office) / [x] Appointment(In office/virtual)/ []  [facility]/ [] Home Care/ [] Refused Recommended Disposition /[] [facility]/ []  Follow-up with PCP Additional Notes:  Near syncope intermittently since last week.  Last night she fell on the ground while outside on pavement. She denies lightheadedness, and dizziness, denies chest pain, denies shortness of breath. She thinks she just tripped but unsure. She was able to get up on her own, did not loss consciousness. No injury noted at the time of the fall. Calling today because she has a right black eye with mild swelling, no visual changes, denies pain. She reports she stopped taking her blood pressure medication on Thursday because she was having near syncope each time she stood up. She does not have a blood pressure cuff at home. She states she is feeling fine during this call, no weakness, dizziness. She had near syncope about 3 times over this weekend. Next available acute visit scheduled with PCP on 11/12/23, added to wait list for today. Plan to check blood pressure at [facility] and if not wnl she will return call today. Educated on care advice as documented in protocol, patient verbalized understanding. Discussed reasons to call back.    Copied from CRM [id number]. Topic: Clinical - Red Word Triage >> Nov 11, 2023 11:20 AM Leah Jones wrote: [facility] that prompted transfer to Nurse Triage: Leah Jones and has a black out, passing out multiple times, high blood pressure Reason for Disposition  MILD weakness (i.e., does not interfere with ability to work, go to school, normal activities)  (Exception: Mild weakness is a chronic  symptom.)  Taking a medicine that could cause weakness (e.g., blood pressure medications, diuretics)  Protocols used: Falls and Falling-A-AH, Weakness (Generalized) and Fatigue-A-AH

## 2023-11-11 NOTE — Telephone Encounter (Signed)
 No further action needed.

## 2023-11-12 ENCOUNTER — Ambulatory Visit: Admitting: Family Medicine

## 2023-11-12 NOTE — Progress Notes (Deleted)
 Leah Jones , 04/22/1961, 63 y.o., female MRN: [id number] Patient Care Team    Relationship Specialty Notifications Start End  Mariel Shope, DO PCP - General Family Medicine  06/19/15   Obgyn, Corbin Dess    03/05/22     No chief complaint on file.    Subjective: Leah Jones is a 63 y.o. Pt presents for an OV with complaints of *** of *** duration.  Associated symptoms include ***.  Pt has tried *** to ease their symptoms.      12/10/2022    2:09 PM 11/20/2022    2:15 PM 03/05/2022    9:03 AM 12/12/2020    3:08 PM 12/05/2020    1:13 PM  Depression screen PHQ 2/9  Decreased Interest  0 0 0 0  Down, Depressed, Hopeless 0 0 0 0 0  PHQ - 2 Score 0 0 0 0 0  Altered sleeping 0 3     Tired, decreased energy 0 2     Change in appetite 0 0     Feeling bad or failure about yourself  0 0     Trouble concentrating 0 3     Moving slowly or fidgety/restless 0 2     Suicidal thoughts 0 0     PHQ-9 Score 0 10     Difficult doing work/chores Not difficult at all Somewhat difficult       Allergies  Allergen Reactions   Bactrim [Sulfamethoxazole-Trimethoprim ] Anaphylaxis   Sulfa Antibiotics Anaphylaxis   Lisinopril  Other (See Comments)    ? 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 alcohol  related   Alcohol  dependence, daily use (HCC) 11/17/2018   Alcoholism (HCC)    per record   Allergic reaction 09/24/2022   ANAL FISSURE, HX OF 04/15/2007   Annotation: occasional rectal bleeding Qualifier: Diagnosis of  By: Neomi Banks MD, Anitra Ket.    Angio-edema    B12 deficiency    Carpal tunnel syndrome    Carpal tunnel syndrome 12/01/2009   Qualifier: Diagnosis of  By: Neomi Banks MD, Jose E.    COVID-19 virus infection 08/23/2019   Diarrhea 11/17/2018   Discoloration of  skin of foot 05/20/2014   Purplish discoloration top of L foot    Eczema    Eustachian tube dysfunction 01/25/2014   Mallet deformity of fifth finger, left, acquired 06/19/2015   Suicidal ideation    Urticaria    Past Surgical History:  Procedure Laterality Date   COLPOSCOPY  2020   pt reported benign.    FOOT SURGERY Left    Morton's neuroma, Dr Lova Rua OF NASOLACRIMAL DUCT, SILICONE TUBE, WITH OR WITHOUT IRRIGATION; WITH INSERTION OF TUBE OR STENT  09/24/2023   RHINOPLASTY     TONSILLECTOMY     Family History  Problem Relation Age of Onset   Stomach cancer Mother    Colon polyps Mother    Asthma Mother    Colon cancer Mother    Breast cancer Mother    Celiac disease Mother    Colon polyps Father    Alcoholism Father    Colon polyps Sister    Asthma Sister    Anxiety disorder Sister    ADD / ADHD  Sister    Colon polyps Brother    Bipolar disorder Brother    Pancreatic cancer Brother    Lung cancer Paternal Grandmother    Pancreatic cancer Paternal Grandfather    Asthma Daughter    Asthma Son    Eczema Son    Bipolar disorder Son    Diabetes Other    Hypertension Other    Esophageal cancer Neg Hx    Rectal cancer Neg Hx    Allergies as of 11/12/2023       Reactions   Bactrim [sulfamethoxazole-trimethoprim ] Anaphylaxis   Sulfa Antibiotics Anaphylaxis   Lisinopril  Other (See Comments)   ? angioedema   Seroquel  [quetiapine  Fumarate] Other (See Comments)   Increased sedation on low dose and leg weakness        Medication List        Accurate as of November 12, 2023  9:09 AM. If you have any questions, ask your nurse or doctor.          amLODipine  5 MG tablet Commonly known as: NORVASC  Take 1 tablet (5 mg total) by mouth daily.   atomoxetine  40 MG capsule Commonly known as: STRATTERA  Take 1 capsule (40 mg total) by mouth daily.   b complex vitamins capsule Take by mouth.   cetirizine  10 MG tablet Commonly known as: ZYRTEC  Take 1 tablet  (10 mg total) by mouth at bedtime.   DULoxetine  60 MG capsule Commonly known as: Cymbalta  Take 1 capsule (60 mg total) by mouth daily.   EPINEPHrine  0.3 mg/0.3 mL Soaj injection Commonly known as: EPI-PEN Inject 0.3 mg into the muscle as needed for anaphylaxis.   estradiol  1 MG tablet Commonly known as: ESTRACE  Take 1 mg by mouth daily.   fluticasone  0.05 % cream Commonly known as: CUTIVATE  SMARTSIG:Topical Morning-Night PRN   guanFACINE  1 MG tablet Commonly known as: TENEX  Take 1 tablet (1 mg total) by mouth at bedtime.   loteprednol 0.5 % ophthalmic suspension Commonly known as: LOTEMAX Apply to eye.   magnesium  oxide 400 MG tablet Commonly known as: MAG-OX Take 1 tablet by mouth daily.   medroxyPROGESTERone 2.5 MG tablet Commonly known as: PROVERA Take 2.5 mg by mouth daily.   Melatonin 3 MG Subl Take 1 tablet by mouth at bedtime as needed.   naltrexone  50 MG tablet Commonly known as: DEPADE Take 2 tablets (100 mg total) by mouth at bedtime.   neomycin -polymyxin b -dexamethasone 3.5-10000-0.1 Susp Commonly known as: MAXITROL SMARTSIG:In Eye(s)   propranolol  ER 120 MG 24 hr capsule Commonly known as: INDERAL  LA Take 1 capsule (120 mg total) by mouth daily.        All past medical history, surgical history, allergies, family history, immunizations andmedications were updated in the EMR today and reviewed under the history and medication portions of their EMR.     ROS Negative, with the exception of above mentioned in HPI   Objective:  LMP 04/18/2019 (Approximate)  There is no height or weight on file to calculate BMI.  Physical Exam   No results found. No results found. No results found for this or any previous visit (from the past 24 hours).  Assessment/Plan: Leah Jones is a 63 y.o. female present for OV for  *** Reviewed expectations re: course of current medical issues. Discussed self-management of symptoms. Outlined signs and symptoms  indicating need for more acute intervention. Patient verbalized understanding and all questions were answered. Patient received an After-Visit Summary.    No orders of the defined types  were placed in this encounter.  No orders of the defined types were placed in this encounter.  Referral Orders  No referral(s) requested today     Note is dictated utilizing voice recognition software. Although note has been proof read prior to signing, occasional typographical errors still can be missed. If any questions arise, please do not hesitate to call for verification.   electronically signed by:  Napolean Backbone, DO  [facility] [place]

## 2023-11-13 ENCOUNTER — Ambulatory Visit: Admitting: Urgent Care

## 2023-11-14 ENCOUNTER — Encounter: Payer: Self-pay | Admitting: Family Medicine

## 2023-11-14 ENCOUNTER — Ambulatory Visit: Attending: Family Medicine

## 2023-11-14 ENCOUNTER — Ambulatory Visit (INDEPENDENT_AMBULATORY_CARE_PROVIDER_SITE_OTHER): Admitting: Family Medicine

## 2023-11-14 VITALS — BP 136/76 | HR 82 | Temp 98.2°F | Wt 129.4 lb

## 2023-11-14 DIAGNOSIS — R55 Syncope and collapse: Secondary | ICD-10-CM

## 2023-11-14 DIAGNOSIS — R0789 Other chest pain: Secondary | ICD-10-CM

## 2023-11-14 DIAGNOSIS — R531 Weakness: Secondary | ICD-10-CM

## 2023-11-14 LAB — B12 AND FOLATE PANEL
Folate: 5 ng/mL — ABNORMAL LOW (ref >5.9)
Vitamin B-12: 520 pg/mL (ref 211–911)

## 2023-11-14 LAB — CBC WITH DIFFERENTIAL/PLATELET
Basophils Absolute: 0.1 10*3/uL (ref 0.0–0.1)
Basophils Relative: 2.2 % (ref 0.0–3.0)
Eosinophils Absolute: 0.2 10*3/uL (ref 0.0–0.7)
Eosinophils Relative: 5.5 % — ABNORMAL HIGH (ref 0.0–5.0)
HCT: 42.5 % (ref 36.0–46.0)
Hemoglobin: 14.2 g/dL (ref 12.0–15.0)
Lymphocytes Relative: 40.3 % (ref 12.0–46.0)
Lymphs Abs: 1.8 10*3/uL (ref 0.7–4.0)
MCHC: 33.4 g/dL (ref 30.0–36.0)
MCV: 95.1 fl (ref 78.0–100.0)
Monocytes Absolute: 0.7 10*3/uL (ref 0.1–1.0)
Monocytes Relative: 16.5 % — ABNORMAL HIGH (ref 3.0–12.0)
Neutro Abs: 1.6 10*3/uL (ref 1.4–7.7)
Neutrophils Relative %: 35.5 % — ABNORMAL LOW (ref 43.0–77.0)
Platelets: 304 10*3/uL (ref 150.0–400.0)
RBC: 4.47 Mil/uL (ref 3.87–5.11)
RDW: 16.8 % — ABNORMAL HIGH (ref 11.5–15.5)
WBC: 4.5 10*3/uL (ref 4.0–10.5)

## 2023-11-14 LAB — COMPREHENSIVE METABOLIC PANEL WITH GFR
ALT: 12 U/L (ref 0–35)
AST: 20 U/L (ref 0–37)
Albumin: 4.2 g/dL (ref 3.5–5.2)
Alkaline Phosphatase: 67 U/L (ref 39–117)
BUN: 3 mg/dL — ABNORMAL LOW (ref 6–23)
CO2: 28 meq/L (ref 19–32)
Calcium: 9.2 mg/dL (ref 8.4–10.5)
Chloride: 98 meq/L (ref 96–112)
Creatinine, Ser: 0.56 mg/dL (ref 0.40–1.20)
GFR: 99.19 mL/min (ref >60.00)
Glucose, Bld: 91 mg/dL (ref 70–99)
Potassium: 4.4 meq/L (ref 3.5–5.1)
Sodium: 137 meq/L (ref 135–145)
Total Bilirubin: 0.7 mg/dL (ref 0.2–1.2)
Total Protein: 7.1 g/dL (ref 6.0–8.3)

## 2023-11-14 LAB — TSH: TSH: 1.75 u[IU]/mL (ref 0.35–5.50)

## 2023-11-14 NOTE — Progress Notes (Signed)
 Leah Jones , Jan 07, 1961, 63 y.o., female MRN: [id number] Patient Care Team    Relationship Specialty Notifications Start End  Mariel Shope, DO PCP - General Family Medicine  06/19/15   Obgyn, Corbin Dess    03/05/22     Chief Complaint  Patient presents with   Fall    Monday; Pt was going for a walk; stated she must have passed out for a moment because she does not remember falling. Hit head/face causing bruising. No other injuries.      Subjective: Leah Jones is a 63 y.o. Pt presents for an OV with complaints of fall versus syncopal episode that occurred 4 days ago on Monday.  She states she was walking in her neighborhood in the early evening, on the sidewalk, when she felt like she lost her footing and the next thing she realized she was waking up on the ground.  She had sunglasses on, and sunglasses hit against her face causing bruising surrounding her right eye.  She does not recall falling, only losing her footing. She states she had noticed a couple days prior to this episode when she would stand up she would become dizzy and lightheaded.  She was worried her blood pressure was getting too low, so she stopped taking her medication for her blood pressure 3-4 days prior to the syncopal episode.  She also endorses not eating well over the last few weeks.  She recently was discharged from an inpatient rehab clinic to help her with alcohol  cessation.  She reports she has had increased stress over the last 1 to 2 weeks, her significant other has decided to move on.  Patient reports there has been a few days where she has not eaten anything.  She does admit to having 1.5" alcoholic drinks. She had not restarted her Cymbalta .  At the time of syncope, she was not taking any of her medications. She denies any visual changes.  She reports she can move her jaw and head/neck fine without discomfort.   Patient also reports when she was at choir practice on Sunday, she was having chest  discomfort.  She states her chest just felt heavy.  She had to sit down during choir practice, but was able to continue singing.  The pain has not reoccurred.     11/14/2023   10:50 AM 12/10/2022    2:09 PM 11/20/2022    2:15 PM 03/05/2022    9:03 AM 12/12/2020    3:08 PM  Depression screen PHQ 2/9  Decreased Interest 1  0 0 0  Down, Depressed, Hopeless 1 0 0 0 0  PHQ - 2 Score 2 0 0 0 0  Altered sleeping 2 0 3    Tired, decreased energy 2 0 2    Change in appetite 3 0 0    Feeling bad or failure about yourself  1 0 0    Trouble concentrating 2 0 3    Moving slowly or fidgety/restless 0 0 2    Suicidal thoughts 0 0 0    PHQ-9 Score 12 0 10    Difficult doing work/chores Not difficult at all Not difficult at all Somewhat difficult      Allergies  Allergen Reactions   Bactrim [Sulfamethoxazole-Trimethoprim ] Anaphylaxis   Sulfa Antibiotics Anaphylaxis   Lisinopril  Other (See Comments)    ? 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 alcohol  related   Alcohol  dependence, daily use (HCC) 11/17/2018   Alcoholism (HCC)    per record   Allergic reaction 09/24/2022   ANAL FISSURE, HX OF 04/15/2007   Annotation: occasional rectal bleeding Qualifier: Diagnosis of  By: Neomi Banks MD, Anitra Ket.    Angio-edema    B12 deficiency    Carpal tunnel syndrome    Carpal tunnel syndrome 12/01/2009   Qualifier: Diagnosis of  By: Neomi Banks MD, Jose E.    COVID-19 virus infection 08/23/2019   Diarrhea 11/17/2018   Discoloration of skin of foot 05/20/2014   Purplish discoloration top of L foot    Eczema    Eustachian tube dysfunction 01/25/2014   Mallet deformity of fifth finger, left, acquired 06/19/2015   Suicidal ideation    Urticaria    Past Surgical History:  Procedure  Laterality Date   COLPOSCOPY  2020   pt reported benign.    FOOT SURGERY Left    Morton's neuroma, Dr Lova Rua OF NASOLACRIMAL DUCT, SILICONE TUBE, WITH OR WITHOUT IRRIGATION; WITH INSERTION OF TUBE OR STENT  09/24/2023   RHINOPLASTY     TONSILLECTOMY     Family History  Problem Relation Age of Onset   Stomach cancer Mother    Colon polyps Mother    Asthma Mother    Colon cancer Mother    Breast cancer Mother    Celiac disease Mother    Colon polyps Father    Alcoholism Father    Colon polyps Sister    Asthma Sister    Anxiety disorder Sister    ADD / ADHD Sister    Colon polyps Brother    Bipolar disorder Brother    Pancreatic cancer Brother    Lung cancer Paternal Grandmother    Pancreatic cancer Paternal Grandfather    Asthma Daughter    Asthma Son    Eczema Son    Bipolar disorder Son    Diabetes Other    Hypertension Other    Esophageal cancer Neg Hx    Rectal cancer Neg Hx    Allergies as of 11/14/2023       Reactions   Bactrim [sulfamethoxazole-trimethoprim ] Anaphylaxis   Sulfa Antibiotics Anaphylaxis   Lisinopril  Other (See Comments)   ? angioedema   Seroquel  [quetiapine  Fumarate] Other (See Comments)   Increased sedation on low dose and leg weakness        Medication List        Accurate as of November 14, 2023 12:31 PM. If you have any questions, ask your nurse or doctor.          STOP taking these medications    atomoxetine  40 MG capsule Commonly known as: STRATTERA  Stopped by: Napolean Backbone       TAKE these medications    amLODipine  5 MG tablet Commonly known as: NORVASC  Take 1 tablet (5 mg total) by mouth daily.   b complex vitamins capsule Take by mouth.   cetirizine  10 MG tablet Commonly known as: ZYRTEC  Take 1 tablet (10 mg total) by mouth at bedtime.   DULoxetine  60 MG capsule Commonly known as: Cymbalta  Take 1 capsule (60 mg total) by mouth daily.   EPINEPHrine  0.3 mg/0.3 mL Soaj injection Commonly known as:  EPI-PEN Inject 0.3 mg into the muscle as needed for anaphylaxis.  estradiol  1 MG tablet Commonly known as: ESTRACE  Take 1 mg by mouth daily.   fluticasone  0.05 % cream Commonly known as: CUTIVATE  SMARTSIG:Topical Morning-Night PRN   guanFACINE  1 MG tablet Commonly known as: TENEX  Take 1 tablet (1 mg total) by mouth at bedtime.   loteprednol 0.5 % ophthalmic suspension Commonly known as: LOTEMAX Apply to eye.   magnesium  oxide 400 MG tablet Commonly known as: MAG-OX Take 1 tablet by mouth daily.   medroxyPROGESTERone 2.5 MG tablet Commonly known as: PROVERA Take 2.5 mg by mouth daily.   Melatonin 3 MG Subl Take 1 tablet by mouth at bedtime as needed.   naltrexone  50 MG tablet Commonly known as: DEPADE Take 2 tablets (100 mg total) by mouth at bedtime.   neomycin -polymyxin b -dexamethasone 3.5-10000-0.1 Susp Commonly known as: MAXITROL SMARTSIG:In Eye(s)   propranolol  ER 120 MG 24 hr capsule Commonly known as: INDERAL  LA Take 1 capsule (120 mg total) by mouth daily.        All past medical history, surgical history, allergies, family history, immunizations andmedications were updated in the EMR today and reviewed under the history and medication portions of their EMR.     ROS Negative, with the exception of above mentioned in HPI   Objective:  BP 136/76   Pulse 82   Temp 98.2 F (36.8 C)   Wt 129 lb 6.4 oz (58.7 kg)   LMP 04/18/2019 (Approximate)   SpO2 98%   BMI 20.89 kg/m  Body mass index is 20.89 kg/m. Physical Exam Vitals and nursing note reviewed.  Constitutional:      General: She is not in acute distress.    Appearance: Normal appearance. She is not ill-appearing, toxic-appearing or diaphoretic.  HENT:     Head: Normocephalic and atraumatic.  Eyes:     General: No scleral icterus.       Right eye: No discharge.        Left eye: No discharge.     Extraocular Movements: Extraocular movements intact.     Conjunctiva/sclera: Conjunctivae  normal.     Pupils: Pupils are equal, round, and reactive to light.  Cardiovascular:     Rate and Rhythm: Normal rate and regular rhythm.  Pulmonary:     Effort: Pulmonary effort is normal. No respiratory distress.     Breath sounds: Normal breath sounds. No wheezing, rhonchi or rales.  Musculoskeletal:     Right lower leg: No edema.     Left lower leg: No edema.  Skin:    General: Skin is warm.     Findings: Bruising present. No rash.  Neurological:     Mental Status: She is alert and oriented to person, place, and time. Mental status is at baseline.     Motor: No weakness.     Gait: Gait normal.  Psychiatric:        Mood and Affect: Mood normal.        Behavior: Behavior normal.        Thought Content: Thought content normal.        Judgment: Judgment normal.      No results found. No results found. No results found for this or any previous visit (from the past 24 hours).  Assessment/Plan: Armetta Henri is a 63 y.o. female present for OV for  Syncope, unspecified syncope type (Primary)/weakness Weakness and syncopal episodes could be related to mood lack of nutrition, hypoglycemia-cannot rule out arrhythmia, especially since she was not taking any of her blood pressure meds  which also consists of a beta-blocker. Will start with lab workup today and order a ZIO monitor Patient is encouraged to eat 3 meals a day even if they are small meals a day, with adequate hydration avoiding all alcohol  She is taking her amlodipine  5 mg daily and propranolol  120 mg today and blood pressure/heart rate in normal range.  Continue. As far as her anxiety have encouraged her to restart her Cymbalta  and Intuniv  nightly for her ADHD - Comp Met (CMET) - TSH - CBC w/Diff - B12 and Folate Panel - Vitamin B1 - LONG TERM MONITOR (3-14 DAYS); Future  Facial bruising: Patient declined x-ray today, will check and is reasonable.  Range of motion of neck and jaw are normal.  No visual disturbances  reported.  Pain present only to palpation over zygomatic  chest discomfort Chest discomfort certainly could be contributed to her increasing anxiety.  However with her history of alcohol  dependence, followed by recent syncopal episode will refer to cardiology for further evaluation to be safe. Currently not feeling chest discomfort.   Reviewed expectations re: course of current medical issues. Discussed self-management of symptoms. Outlined signs and symptoms indicating need for more acute intervention. Patient verbalized understanding and all questions were answered. Patient received an After-Visit Summary.    Orders Placed This Encounter  Procedures   Comp Met (CMET)   TSH   CBC w/Diff   B12 and Folate Panel   Vitamin B1   Ambulatory referral to Cardiology   LONG TERM MONITOR (3-14 DAYS)   No orders of the defined types were placed in this encounter.  Referral Orders         Ambulatory referral to Cardiology       Note is dictated utilizing voice recognition software. Although note has been proof read prior to signing, occasional typographical errors still can be missed. If any questions arise, please do not hesitate to call for verification.   electronically signed by:  Napolean Backbone, DO  [facility] [place]

## 2023-11-14 NOTE — Progress Notes (Unsigned)
 EP to read.

## 2023-11-14 NOTE — Patient Instructions (Signed)

## 2023-11-17 ENCOUNTER — Other Ambulatory Visit: Payer: Self-pay | Admitting: Family Medicine

## 2023-11-17 ENCOUNTER — Telehealth: Payer: Self-pay

## 2023-11-17 MED ORDER — FOLIC ACID 1 MG PO TABS: 1.0000 mg | ORAL_TABLET | Freq: Every day | ORAL | 1 refills | Status: DC

## 2023-11-17 NOTE — Telephone Encounter (Signed)
 Reason for CRM: pt called for lab result, requesting a call back at [phone]                                LVM. Pt MyChart sent/

## 2023-11-18 ENCOUNTER — Telehealth: Payer: Self-pay | Admitting: Family Medicine

## 2023-11-18 LAB — VITAMIN B1: Vitamin B1 (Thiamine): 6 nmol/L — ABNORMAL LOW (ref 8–30)

## 2023-11-18 MED ORDER — THIAMINE HCL 100 MG PO TABS: 100.0000 mg | ORAL_TABLET | Freq: Every day | ORAL | 1 refills | Status: DC

## 2023-11-18 NOTE — Telephone Encounter (Signed)
 LM for pt to return call to discuss.

## 2023-11-18 NOTE — Telephone Encounter (Signed)
 Please please call patient  Please provide the prior phone note to patient, she has not yet received message and instructions must be provided to her.    Liver, kidney and thyroid  function are normal  Blood cell counts and electrolytes are stable  B12 is in normal range  Folate levels are low.  B1-thiamine vitamin is also low.  I have called in a folic acid  and a thiamine supplement to take once daily to increase levels into normal range..  Low folic acid  and low thiamine both cause neurological symptoms, decreased energy, neuro/muscular discomfort, neuropathy .  the lack of adequate nutrition, low thiamine and low folic acid , certainly could be contributing to her symptoms and her lightheadedness.   -Also please advise patient I did place a cardiology referral for her after her visit to be safe, since she was having some chest pain.    Follow-up in 3 months on chronic conditions and retesting of vitamin deficiencies

## 2023-11-19 NOTE — Telephone Encounter (Signed)
 Results and recommendations given to pt.

## 2023-11-21 ENCOUNTER — Ambulatory Visit: Payer: Self-pay

## 2023-11-21 NOTE — Telephone Encounter (Signed)
 FYI has been reviewed.

## 2023-11-21 NOTE — Telephone Encounter (Signed)
 Chief Complaint: lightheadedness Symptoms: head pressure, dizziness during events Frequency: today x 2 Pertinent Negatives: Patient denies falls, head injury Disposition: [] ED /[] Urgent Care (no appt availability in office) / [x] Appointment(In office/virtual)/ []  [facility]/ [] Home Care/ [] Refused Recommended Disposition /[] [facility]/ []  Follow-up with PCP Additional Notes: Pt calling c/o lightheadedness, dizziness, and head pressure x 2 events today at the store. Pt reports she felt her knees buckle and was able to lower herself to her knees. She is currently in the car at the grocery store and reports that sx worsen with her standing up. Endorsed being hydrated, but perhaps not enough. Triager reinforced safety, and asked if family/caregiver is able to pick her up to take home. Pt states that she only lives 3 miles away and is OK to drive. Of note, pt has additional questions about folate levels and questions about how long supplement would take for effect. Triager unable to answer question and advised to speak with PCP at appt. Scheduled patient per protocol on 11/24/2023. Patient verbalized understanding and to call back/go to UC with worsening symptoms/falls/head injury.      Copied from CRM [id number]. Topic: Clinical - Red Word Triage >> Nov 21, 2023  3:07 PM Allyne Areola wrote: Red Word that prompted transfer to Nurse Triage: Patient is calling because she was advised that her  folic acid  was low, she has been feeling like she is going to pass out. It happened a couples times today while getting up. She is also experiencing dizziness and pressure in her head. Reason for Disposition  [1] MODERATE dizziness (e.g., interferes with normal activities) AND [2] has been evaluated by doctor (or NP/PA) for this  Answer Assessment - Initial Assessment Questions 1. DESCRIPTION: "Describe your dizziness."     Recent acute visit this week - reports dx with low folic acid  "I just went  into the convenience store and about passed out" - my knees buckled, dizziness, pressure in head, and lowered down to ground 2. LIGHTHEADED: "Do you feel lightheaded?" (e.g., somewhat faint, woozy, weak upon standing)     faint 3. VERTIGO: "Do you feel like either you or the room is spinning or tilting?" (i.e. vertigo)     denies 4. SEVERITY: "How bad is it?"  "Do you feel like you are going to faint?" "Can you stand and walk?"   - MILD: Feels slightly dizzy, but walking normally.   - MODERATE: Feels unsteady when walking, but not falling; interferes with normal activities (e.g., school, work).   - SEVERE: Unable to walk without falling, or requires assistance to walk without falling; feels like passing out now.      moderate 5. ONSET:  "When did the dizziness begin?"     A while 6. AGGRAVATING FACTORS: "Does anything make it worse?" (e.g., standing, change in head position)     Standing up. Endorses getting up very slowly as precaution 7. HEART RATE: "Can you tell me your heart rate?" "How many beats in 15 seconds?"  (Note: not all patients can do this)       N/a 8. CAUSE: "What do you think is causing the dizziness?"     Unknown, possibly folic acid  9. RECURRENT SYMPTOM: "Have you had dizziness before?" If Yes, ask: "When was the last time?" "What happened that time?"     Today, knees buckled at store 10. OTHER SYMPTOMS: "Do you have any other symptoms?" (e.g., fever, chest pain, vomiting, diarrhea, bleeding)  Denies at this time, but does endorse nausea/weakness 2 days ago  Protocols used: Dizziness - Lightheadedness-A-AH

## 2023-11-24 ENCOUNTER — Ambulatory Visit (INDEPENDENT_AMBULATORY_CARE_PROVIDER_SITE_OTHER): Admitting: Family Medicine

## 2023-11-24 ENCOUNTER — Encounter: Payer: Self-pay | Admitting: Family Medicine

## 2023-11-24 VITALS — BP 136/82 | HR 79 | Temp 98.0°F | Wt 132.0 lb

## 2023-11-24 DIAGNOSIS — E538 Deficiency of other specified B group vitamins: Secondary | ICD-10-CM | POA: Diagnosis not present

## 2023-11-24 DIAGNOSIS — R55 Syncope and collapse: Secondary | ICD-10-CM

## 2023-11-24 DIAGNOSIS — S0990XD Unspecified injury of head, subsequent encounter: Secondary | ICD-10-CM

## 2023-11-24 DIAGNOSIS — R42 Dizziness and giddiness: Secondary | ICD-10-CM

## 2023-11-24 LAB — IBC + FERRITIN
Ferritin: 160.4 ng/mL (ref 10.0–291.0)
Iron: 203 ug/dL — ABNORMAL HIGH (ref 42–145)
Saturation Ratios: 56.2 % — ABNORMAL HIGH (ref 20.0–50.0)
TIBC: 361.2 ug/dL (ref 250.0–450.0)
Transferrin: 258 mg/dL (ref 212.0–360.0)

## 2023-11-24 NOTE — Progress Notes (Signed)
 Leah Jones , [birth year], 63 y.o., female MRN: [id number] Patient Care Team    Relationship Specialty Notifications Start End  Mariel Shope, DO PCP - General Family Medicine  06/19/15   Obgyn, Corbin Dess    03/05/22     Chief Complaint  Patient presents with   Dizziness    F/U. Pt states since the fall, she is still experiencing dizziness; feels like she is going to pass out.      Subjective: Leah Jones is a 63 y.o. Pt presents for an OV with complaints continued dizziness since fall. Pt was seen 11/14/2023 for a fall she states occurred 11/10/2023. She sustained bruising to her face, but had reported feeling ok. She was not seen at the time of injury.  Please see below for full details. During that visit patient was found to have folate deficiency, thiamine  deficiency, with normal CBC and CMP.  She declined x-ray at that time. She continues states she is not eating well.  She is uncertain if she has not on the days that she has had the presyncope episodes. She describes the episodes of feeling like her knees are going to buckle and she is going to pass out.  She denies vertigo-like symptoms.  She denies chest pain with the recent presyncopal episodes or shortness of breath.  She states she will feel like there is pressure in her head with onset of symptoms.  Prior note:  of fall versus syncopal episode that occurred 4 days ago on Monday.  She states she was walking in her neighborhood in the early evening, on the sidewalk, when she felt like she lost her footing and the next thing she realized she was waking up on the ground.  She had sunglasses on, and sunglasses hit against her face causing bruising surrounding her right eye.  She does not recall falling, only losing her footing. She states she had noticed a couple days prior to this episode when she would stand up she would become dizzy and lightheaded.  She was worried her blood pressure was getting too low, so she stopped taking  her medication for her blood pressure 3-4 days prior to the syncopal episode.  She also endorses not eating well over the last few weeks.  She recently was discharged from an inpatient rehab clinic to help her with alcohol  cessation.  She reports she has had increased stress over the last 1 to 2 weeks, her significant other has decided to move on.  Patient reports there has been a few days where she has not eaten anything.  She does admit to having 1.5" alcoholic drinks. She had not restarted her Cymbalta .  At the time of syncope, she was not taking any of her medications. She denies any visual changes.  She reports she can move her jaw and head/neck fine without discomfort.   Patient also reports when she was at choir practice on Sunday, she was having chest discomfort.  She states her chest just felt heavy.  She had to sit down during choir practice, but was able to continue singing.  The pain has not reoccurred.     11/14/2023   10:50 AM 12/10/2022    2:09 PM 11/20/2022    2:15 PM 03/05/2022    9:03 AM 12/12/2020    3:08 PM  Depression screen PHQ 2/9  Decreased Interest 1  0 0 0  Down, Depressed, Hopeless 1 0 0 0 0  PHQ - 2 Score 2  0 0 0 0  Altered sleeping 2 0 3    Tired, decreased energy 2 0 2    Change in appetite 3 0 0    Feeling bad or failure about yourself  1 0 0    Trouble concentrating 2 0 3    Moving slowly or fidgety/restless 0 0 2    Suicidal thoughts 0 0 0    PHQ-9 Score 12 0 10    Difficult doing work/chores Not difficult at all Not difficult at all Somewhat difficult      Allergies  Allergen Reactions   Bactrim [Sulfamethoxazole-Trimethoprim ] Anaphylaxis   Sulfa Antibiotics Anaphylaxis   Lisinopril  Other (See Comments)    ? 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 alcohol  related   Alcohol  dependence, daily use (HCC) 11/17/2018   Alcoholism (HCC)    per record   Allergic reaction 09/24/2022   ANAL FISSURE, HX OF 04/15/2007   Annotation: occasional rectal bleeding Qualifier: Diagnosis of  By: Neomi Banks MD, Anitra Ket.    Angio-edema    B12 deficiency    Carpal tunnel syndrome    Carpal tunnel syndrome 12/01/2009   Qualifier: Diagnosis of  By: Neomi Banks MD, Jose E.    COVID-19 virus infection 08/23/2019   Diarrhea 11/17/2018   Discoloration of skin of foot 05/20/2014   Purplish discoloration top of L foot    Eczema    Eustachian tube dysfunction 01/25/2014   Mallet deformity of fifth finger, left, acquired 06/19/2015   Suicidal ideation    Urticaria    Past Surgical History:  Procedure Laterality Date   COLPOSCOPY  2020   pt reported benign.    FOOT SURGERY Left    Morton's neuroma, Dr Lova Rua OF NASOLACRIMAL DUCT, SILICONE TUBE, WITH OR WITHOUT IRRIGATION; WITH INSERTION OF TUBE OR STENT  09/24/2023   RHINOPLASTY     TONSILLECTOMY     Family History  Problem Relation Age of Onset   Stomach cancer Mother    Colon polyps Mother    Asthma Mother    Colon cancer Mother    Breast cancer Mother    Celiac disease Mother    Colon polyps Father    Alcoholism Father    Colon polyps Sister    Asthma Sister    Anxiety disorder Sister    ADD / ADHD Sister    Colon polyps Brother    Bipolar disorder Brother    Pancreatic cancer Brother    Lung cancer Paternal Grandmother    Pancreatic cancer Paternal Grandfather    Asthma Daughter    Asthma Son    Eczema Son    Bipolar disorder Son    Diabetes Other    Hypertension Other    Esophageal cancer Neg Hx    Rectal cancer Neg Hx    Allergies as of 11/24/2023       Reactions   Bactrim [sulfamethoxazole-trimethoprim ] Anaphylaxis   Sulfa Antibiotics Anaphylaxis   Lisinopril  Other (See Comments)   ? angioedema   Seroquel  [quetiapine  Fumarate] Other (See Comments)    Increased sedation on low dose and leg weakness        Medication List        Accurate as of  Nov 24, 2023 12:22 PM. If you have any questions, ask your nurse or doctor.          amLODipine  5 MG tablet Commonly known as: NORVASC  Take 1 tablet (5 mg total) by mouth daily.   b complex vitamins capsule Take by mouth.   cetirizine  10 MG tablet Commonly known as: ZYRTEC  Take 1 tablet (10 mg total) by mouth at bedtime.   DULoxetine  60 MG capsule Commonly known as: Cymbalta  Take 1 capsule (60 mg total) by mouth daily.   EPINEPHrine  0.3 mg/0.3 mL Soaj injection Commonly known as: EPI-PEN Inject 0.3 mg into the muscle as needed for anaphylaxis.   estradiol  1 MG tablet Commonly known as: ESTRACE  Take 1 mg by mouth daily.   fluticasone  0.05 % cream Commonly known as: CUTIVATE  SMARTSIG:Topical Morning-Night PRN   folic acid  1 MG tablet Commonly known as: FOLVITE  Take 1 tablet (1 mg total) by mouth daily.   guanFACINE  1 MG tablet Commonly known as: TENEX  Take 1 tablet (1 mg total) by mouth at bedtime.   loteprednol 0.5 % ophthalmic suspension Commonly known as: LOTEMAX Apply to eye.   magnesium  oxide 400 MG tablet Commonly known as: MAG-OX Take 1 tablet by mouth daily.   medroxyPROGESTERone 2.5 MG tablet Commonly known as: PROVERA Take 2.5 mg by mouth daily.   Melatonin 3 MG Subl Take 1 tablet by mouth at bedtime as needed.   naltrexone  50 MG tablet Commonly known as: DEPADE Take 2 tablets (100 mg total) by mouth at bedtime.   neomycin -polymyxin b -dexamethasone 3.5-10000-0.1 Susp Commonly known as: MAXITROL SMARTSIG:In Eye(s)   propranolol  ER 120 MG 24 hr capsule Commonly known as: INDERAL  LA Take 1 capsule (120 mg total) by mouth daily.   thiamine  100 MG tablet Commonly known as: VITAMIN B1 Take 1 tablet (100 mg total) by mouth daily.        All past medical history, surgical history, allergies, family history, immunizations andmedications were  updated in the EMR today and reviewed under the history and medication portions of their EMR.     ROS Negative, with the exception of above mentioned in HPI   Objective:  BP 136/82   Pulse 79   Temp 98 F (36.7 C)   Wt 132 lb (59.9 kg)   LMP 04/18/2019 (Approximate)   SpO2 97%   BMI 21.31 kg/m  Body mass index is 21.31 kg/m. Physical Exam Vitals and nursing note reviewed.  Constitutional:      General: She is not in acute distress.    Appearance: Normal appearance. She is not ill-appearing, toxic-appearing or diaphoretic.  HENT:     Head: Normocephalic and atraumatic.  Eyes:     General: No visual field deficit or scleral icterus.       Right eye: No discharge.        Left eye: No discharge.     Extraocular Movements: Extraocular movements intact.     Conjunctiva/sclera: Conjunctivae normal.     Pupils: Pupils are equal, round, and reactive to light.  Cardiovascular:     Rate and Rhythm: Normal rate and regular rhythm.  Pulmonary:     Effort: Pulmonary effort is normal. No respiratory distress.     Breath sounds: Normal breath sounds. No wheezing, rhonchi or rales.  Musculoskeletal:     Right lower leg: No edema.     Left lower leg: No edema.  Skin:    General: Skin is warm.     Findings: Bruising present. No rash.  Neurological:     Mental Status: She is alert and oriented to person, place, and time. Mental status is at baseline.     Cranial Nerves: No dysarthria or facial asymmetry.     Sensory: Sensation is intact.     Motor: Motor function is intact. No weakness or pronator drift.     Coordination: Coordination is intact. Romberg sign negative.     Gait: Gait normal.  Psychiatric:        Mood and Affect: Mood normal.        Behavior: Behavior normal.        Thought Content: Thought content normal.        Judgment: Judgment normal.      No results found. No results found. No results found for this or any previous visit (from the past 24  hours).  Assessment/Plan: Nusrat Line is a 63 y.o. female present for OV for  Syncope episode with presyncope/(Primary)/weakness -Weakness and syncopal episodes could have been related to lack of nutrition.  She is still having presyncopal episodes now.  -Reassured her that her blood pressures would not be causing her symptoms.  BP is controlled.  -Cannot rule out episodes of hypoglycemia occurring since she is not eating.  -Strongly encouraged her to eat 3 meals a day even if small.  Provided her with a sample of the libre 3 and educated her that if she has symptoms her to see what her blood glucose level is at that time.   -adequate hydration avoiding all alcohol  - Comp Met (CMET)> normal.  - TSH> normal - CBC w/Diff> normal - B12 > normal - Folate Panel> low> started supplement - Vitamin B1> low> called in script> has not started it. - LONG TERM MONITOR (3-14 DAYS); Future> received> CMA help place ZIO monitor on her today. She has been referred to cardiology  Reviewed expectations re: course of current medical issues. Discussed self-management of symptoms. Outlined signs and symptoms indicating need for more acute intervention. Patient verbalized understanding and all questions were answered. Patient received an After-Visit Summary.    Orders Placed This Encounter  Procedures   CT HEAD WO CONTRAST ( )   Homocysteine   IBC + Ferritin   No orders of the defined types were placed in this encounter.  Referral Orders  No referral(s) requested today      Note is dictated utilizing voice recognition software. Although note has been proof read prior to signing, occasional typographical errors still can be missed. If any questions arise, please do not hesitate to call for verification.   electronically signed by:  Napolean Backbone, DO  [facility] [place]

## 2023-11-25 ENCOUNTER — Telehealth: Payer: Self-pay | Admitting: Family Medicine

## 2023-11-25 ENCOUNTER — Telehealth (HOSPITAL_BASED_OUTPATIENT_CLINIC_OR_DEPARTMENT_OTHER): Payer: Self-pay

## 2023-11-25 ENCOUNTER — Telehealth: Payer: Self-pay

## 2023-11-25 DIAGNOSIS — R790 Abnormal level of blood mineral: Secondary | ICD-10-CM

## 2023-11-25 NOTE — Telephone Encounter (Signed)
 Pt aware and verbalized understanding.

## 2023-11-25 NOTE — Telephone Encounter (Signed)
 Copied from CRM [id number]. Topic: Clinical - Lab/Test Results >> Nov 25, 2023 10:31 AM Jenice Mitts wrote: Reason for CRM: Patient is calling because she would like to go over her latest lab results can be reached at number on file

## 2023-11-25 NOTE — Telephone Encounter (Signed)
 Communication  Reason for CRM: Patient is calling because she would like to go over her latest lab results can be reached at number on file   Spoke with pt.

## 2023-11-25 NOTE — Telephone Encounter (Signed)
 Please inform patient Leah Jones iron levels are high and Leah Jones saturations of iron are high. High iron levels can be secondary to history of alcohol  consumption. High iron levels can cause many symptoms including fatigue, night sweats, dizziness and foggy headed/decreased focus.  Have referred Leah Jones to hematologist, who specializes in iron disorders to evaluate and treat. In the meantime we will avoid all iron supplements.

## 2023-11-27 ENCOUNTER — Encounter: Payer: Self-pay | Admitting: Family Medicine

## 2023-11-27 DIAGNOSIS — L821 Other seborrheic keratosis: Secondary | ICD-10-CM | POA: Diagnosis not present

## 2023-11-27 DIAGNOSIS — L570 Actinic keratosis: Secondary | ICD-10-CM | POA: Diagnosis not present

## 2023-11-27 DIAGNOSIS — L814 Other melanin hyperpigmentation: Secondary | ICD-10-CM | POA: Diagnosis not present

## 2023-11-27 DIAGNOSIS — L538 Other specified erythematous conditions: Secondary | ICD-10-CM | POA: Diagnosis not present

## 2023-11-27 DIAGNOSIS — D229 Melanocytic nevi, unspecified: Secondary | ICD-10-CM | POA: Diagnosis not present

## 2023-11-27 DIAGNOSIS — L578 Other skin changes due to chronic exposure to nonionizing radiation: Secondary | ICD-10-CM | POA: Diagnosis not present

## 2023-11-27 DIAGNOSIS — L820 Inflamed seborrheic keratosis: Secondary | ICD-10-CM | POA: Diagnosis not present

## 2023-11-27 DIAGNOSIS — L728 Other follicular cysts of the skin and subcutaneous tissue: Secondary | ICD-10-CM | POA: Diagnosis not present

## 2023-11-27 LAB — HOMOCYSTEINE: Homocysteine: 9.8 umol/L (ref <10.4)

## 2023-11-28 ENCOUNTER — Telehealth (INDEPENDENT_AMBULATORY_CARE_PROVIDER_SITE_OTHER): Admitting: Family Medicine

## 2023-11-28 ENCOUNTER — Encounter: Payer: Self-pay | Admitting: Family Medicine

## 2023-11-28 DIAGNOSIS — D472 Monoclonal gammopathy: Secondary | ICD-10-CM | POA: Insufficient documentation

## 2023-11-28 NOTE — Patient Instructions (Signed)
 Return in about 20 weeks (around 04/16/2024) for Routine chronic condition follow-up.        Great to see you today.  I have refilled the medication(s) we provide.   If labs were collected or images ordered, we will inform you of  results once we have received them and reviewed. We will contact you either by echart message, or telephone call.  Please give ample time to the testing facility, and our office to run,  receive and review results. Please do not call inquiring of results, even if you can see them in your chart. We will contact you as soon as we are able. If it has been over 1 week since the test was completed, and you have not yet heard from us , then please call us .    - echart message- for normal results that have been seen by the patient already.   - telephone call: abnormal results or if patient has not viewed results in their echart.  If a referral to a specialist was entered for you, please call us  in 2 weeks if you have not heard from the specialist office to schedule.

## 2023-11-28 NOTE — Progress Notes (Signed)
 VIRTUAL VISIT VIA VIDEO  I connected with [name] on 11/28/23 at 10:00 AM EDT by a video enabled telemedicine application and verified that I am speaking with the correct person using two identifiers. Location patient: Home Location provider: [facility], Office Persons participating in the virtual visit: Patient, Dr. Marylee Snowball and Pressley Brome, CMA  I discussed the limitations of evaluation and management by telemedicine and the availability of in person appointments. The patient expressed understanding and agreed to proceed.      Leah Jones , September 09, 1960, 63 y.o., female MRN: [id number] Patient Care Team    Relationship Specialty Notifications Start End  Mariel Shope, DO PCP - General Family Medicine  06/19/15   Obgyn, Corbin Dess    03/05/22     Chief Complaint  Patient presents with   Review Results    Pt wanting to discuss her recent labs from 5/5.      Subjective: Leah Jones is a 63 y.o. Pt presents for an OV to discuss abnormal labs collected surrounding a workup on fatigue and dizziness (see note below for details). Patient was provided with full results by call back, but did not understand how she could have iron levels of that are high.  She reports today she has felt close never been knowledgeable of low iron. She also feels her anxiety being elevated at times.  She is going through a break-up of her significant other for years.  Has not started her duloxetine  yet. She is taking her B vitamins now. Of note, she has seen hematology/oncology last June for a positive M spike on SPEP, had been referred by her allergist.  It appears she did not follow-up in the 4 months requested She also reports she was called to schedule she thinks the CT, but was on the other line and declined to schedule at that time but needs callback number in order to do so.  Of note patient was also referred to cardiology and hematology, that could have been the potential caller's to  schedule.   Prior note: with complaints continued dizziness since fall. Pt was seen 11/14/2023 for a fall she states occurred 11/10/2023. She sustained bruising to her face, but had reported feeling ok. She was not seen at the time of injury.  Please see below for full details. During that visit patient was found to have folate deficiency, thiamine  deficiency, with normal CBC and CMP.  She declined x-ray at that time. She continues states she is not eating well.  She is uncertain if she has not on the days that she has had the presyncope episodes. She describes the episodes of feeling like her knees are going to buckle and she is going to pass out.  She denies vertigo-like symptoms.  She denies chest pain with the recent presyncopal episodes or shortness of breath.  She states she will feel like there is pressure in her head with onset of symptoms.  Prior note:  of fall versus syncopal episode that occurred 4 days ago on Monday.  She states she was walking in her neighborhood in the early evening, on the sidewalk, when she felt like she lost her footing and the next thing she realized she was waking up on the ground.  She had sunglasses on, and sunglasses hit against her face causing bruising surrounding her right eye.  She does not recall falling, only losing her footing. She states she had noticed a couple days prior to this episode when  she would stand up she would become dizzy and lightheaded.  She was worried her blood pressure was getting too low, so she stopped taking her medication for her blood pressure 3-4 days prior to the syncopal episode.  She also endorses not eating well over the last few weeks.  She recently was discharged from an inpatient rehab clinic to help her with alcohol  cessation.  She reports she has had increased stress over the last 1 to 2 weeks, her significant other has decided to move on.  Patient reports there has been a few days where she has not eaten anything.  She does  admit to having 1.5" alcoholic drinks. She had not restarted her Cymbalta .  At the time of syncope, she was not taking any of her medications. She denies any visual changes.  She reports she can move her jaw and head/neck fine without discomfort.   Patient also reports when she was at choir practice on Sunday, she was having chest discomfort.  She states her chest just felt heavy.  She had to sit down during choir practice, but was able to continue singing.  The pain has not reoccurred.     11/14/2023   10:50 AM 12/10/2022    2:09 PM 11/20/2022    2:15 PM 03/05/2022    9:03 AM 12/12/2020    3:08 PM  Depression screen PHQ 2/9  Decreased Interest 1  0 0 0  Down, Depressed, Hopeless 1 0 0 0 0  PHQ - 2 Score 2 0 0 0 0  Altered sleeping 2 0 3    Tired, decreased energy 2 0 2    Change in appetite 3 0 0    Feeling bad or failure about yourself  1 0 0    Trouble concentrating 2 0 3    Moving slowly or fidgety/restless 0 0 2    Suicidal thoughts 0 0 0    PHQ-9 Score 12 0 10    Difficult doing work/chores Not difficult at all Not difficult at all Somewhat difficult      Allergies  Allergen Reactions   Bactrim [Sulfamethoxazole-Trimethoprim ] Anaphylaxis   Sulfa Antibiotics Anaphylaxis   Lisinopril  Other (See Comments)    ? 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 alcohol  related   Alcohol  dependence, daily use (HCC) 11/17/2018   Alcoholism (HCC)    per record   Allergic reaction 09/24/2022   ANAL FISSURE, HX OF 04/15/2007   Annotation: occasional rectal bleeding Qualifier: Diagnosis of  By: Neomi Banks MD, Anitra Ket.    Angio-edema    B12 deficiency    Carpal tunnel syndrome    Carpal tunnel syndrome 12/01/2009   Qualifier: Diagnosis of  By: Neomi Banks MD, Jose E.     COVID-19 virus infection 08/23/2019   Diarrhea 11/17/2018   Discoloration of skin of foot 05/20/2014   Purplish discoloration top of L foot    Eczema    Eustachian tube dysfunction 01/25/2014   Mallet deformity of fifth finger, left, acquired 06/19/2015   Suicidal ideation    Urticaria    Past Surgical History:  Procedure Laterality Date   COLPOSCOPY  2020   pt reported benign.    FOOT SURGERY Left    Morton's neuroma,  Dr Lova Rua OF NASOLACRIMAL DUCT, SILICONE TUBE, WITH OR WITHOUT IRRIGATION; WITH INSERTION OF TUBE OR STENT  09/24/2023   RHINOPLASTY     TONSILLECTOMY     Family History  Problem Relation Age of Onset   Stomach cancer Mother    Colon polyps Mother    Asthma Mother    Colon cancer Mother    Breast cancer Mother    Celiac disease Mother    Colon polyps Father    Alcoholism Father    Colon polyps Sister    Asthma Sister    Anxiety disorder Sister    ADD / ADHD Sister    Colon polyps Brother    Bipolar disorder Brother    Pancreatic cancer Brother    Lung cancer Paternal Grandmother    Pancreatic cancer Paternal Grandfather    Asthma Daughter    Asthma Son    Eczema Son    Bipolar disorder Son    Diabetes Other    Hypertension Other    Esophageal cancer Neg Hx    Rectal cancer Neg Hx    Allergies as of 11/28/2023       Reactions   Bactrim [sulfamethoxazole-trimethoprim ] Anaphylaxis   Sulfa Antibiotics Anaphylaxis   Lisinopril  Other (See Comments)   ? angioedema   Seroquel  [quetiapine  Fumarate] Other (See Comments)   Increased sedation on low dose and leg weakness        Medication List        Accurate as of Nov 28, 2023 10:35 AM. If you have any questions, ask your nurse or doctor.          amLODipine  5 MG tablet Commonly known as: NORVASC  Take 1 tablet (5 mg total) by mouth daily.   b complex vitamins capsule Take by mouth.   cetirizine  10 MG tablet Commonly known as: ZYRTEC  Take 1 tablet (10 mg total) by mouth at  bedtime.   DULoxetine  60 MG capsule Commonly known as: Cymbalta  Take 1 capsule (60 mg total) by mouth daily.   EPINEPHrine  0.3 mg/0.3 mL Soaj injection Commonly known as: EPI-PEN Inject 0.3 mg into the muscle as needed for anaphylaxis.   estradiol  1 MG tablet Commonly known as: ESTRACE  Take 1 mg by mouth daily.   fluticasone  0.05 % cream Commonly known as: CUTIVATE  SMARTSIG:Topical Morning-Night PRN   folic acid  1 MG tablet Commonly known as: FOLVITE  Take 1 tablet (1 mg total) by mouth daily.   guanFACINE  1 MG tablet Commonly known as: TENEX  Take 1 tablet (1 mg total) by mouth at bedtime.   loteprednol 0.5 % ophthalmic suspension Commonly known as: LOTEMAX Apply to eye.   magnesium  oxide 400 MG tablet Commonly known as: MAG-OX Take 1 tablet by mouth daily.   medroxyPROGESTERone 2.5 MG tablet Commonly known as: PROVERA Take 2.5 mg by mouth daily.   Melatonin 3 MG Subl Take 1 tablet by mouth at bedtime as needed.   naltrexone  50 MG tablet Commonly known as: DEPADE Take 2 tablets (100 mg total) by mouth at bedtime.   neomycin -polymyxin b -dexamethasone 3.5-10000-0.1 Susp Commonly known as: MAXITROL SMARTSIG:In Eye(s)   propranolol  ER 120 MG 24 hr capsule Commonly known as: INDERAL  LA Take 1 capsule (120 mg total) by mouth daily.   thiamine  100 MG tablet Commonly known as: VITAMIN B1 Take 1 tablet (100 mg total) by mouth daily.        All past medical history, surgical history, allergies, family history, immunizations andmedications were updated in the EMR  today and reviewed under the history and medication portions of their EMR.     ROS Negative, with the exception of above mentioned in HPI   Objective:  LMP 04/18/2019 (Approximate)  There is no height or weight on file to calculate BMI. Physical Exam Vitals and nursing note reviewed.  Constitutional:      General: She is not in acute distress.    Appearance: Normal appearance. She is not  toxic-appearing.  HENT:     Head: Normocephalic and atraumatic.  Eyes:     General: No scleral icterus.       Right eye: No discharge.        Left eye: No discharge.     Conjunctiva/sclera: Conjunctivae normal.  Pulmonary:     Effort: Pulmonary effort is normal.  Musculoskeletal:     Cervical back: Normal range of motion.  Skin:    Findings: No rash.  Neurological:     Mental Status: She is alert and oriented to person, place, and time. Mental status is at baseline.  Psychiatric:        Mood and Affect: Mood normal.        Behavior: Behavior normal.        Thought Content: Thought content normal.        Judgment: Judgment normal.      No results found. No results found. No results found for this or any previous visit (from the past 24 hours).  Assessment/Plan: Kurstin Kotlarz is a 63 y.o. female present for OV for  Syncope episode with presyncope/(Primary)/weakness -Weakness and syncopal episodes could have been related to lack of nutrition.  She is still having presyncopal episodes now.  -Reassured her that her blood pressures would not be causing her symptoms.  BP is controlled.  -Cannot rule out episodes of hypoglycemia occurring since she is not eating.  -Strongly encouraged her to eat 3 meals a day even if small.  Provided her with a sample of the libre 3 and educated her that if she has symptoms her to see what her blood glucose level is at that time.   -adequate hydration avoiding all alcohol  - Comp Met (CMET)> normal.  - TSH> normal - CBC w/Diff> normal - B12 > normal - Folate Panel> low> started supplement - Vitamin B1> low> called in script> started -Elevated iron/elevated iron saturations 203/56.2 with a ferritin of 160 (normal) -Homocysteine levels normal - LONG TERM MONITOR (3-14 DAYS); Future> received> CMA help place ZIO monitor on her 11/24/2023 She has been referred to cardiology> not scheduled as of yet She has been referred to hematology> not scheduled as of  yet She has an order for CT head> not scheduled as of yet We again discussed she needs to start her Cymbalta  to help with her depression and anxiety. Strongly encouraged her to stop drinking altogether, any irritation to the liver can cause an increase in her iron saturations. We discussed hemochromatosis and other causes of elevated iron levels as well as symptoms caused by elevated iron levels today in great detail.  Reviewed expectations re: course of current medical issues. Discussed self-management of symptoms. Outlined signs and symptoms indicating need for more acute intervention. Patient verbalized understanding and all questions were answered. Patient received an After-Visit Summary.    No orders of the defined types were placed in this encounter.  No orders of the defined types were placed in this encounter.  Referral Orders  No referral(s) requested today      Note is  dictated utilizing voice recognition software. Although note has been proof read prior to signing, occasional typographical errors still can be missed. If any questions arise, please do not hesitate to call for verification.   electronically signed by:  Napolean Backbone, DO  [facility] [place]

## 2023-12-08 ENCOUNTER — Ambulatory Visit: Payer: Self-pay

## 2023-12-08 NOTE — Telephone Encounter (Signed)
Pt scheduled for 5:20

## 2023-12-08 NOTE — Telephone Encounter (Signed)
 Copied from CRM [id number]. Topic: Clinical - Red Word Triage >> Dec 08, 2023 10:48 AM Howard Macho wrote: Reason for CRM: patient called stating she would like to be called concerning medication for alcohol  withdrawal. Patient stated she is shaky and is running out of breath and need to hurry up and sit down    Chief Complaint: Asking for medication to help with quitting drinking. Symptoms: Shaky Frequency: n/a Pertinent Negatives: Patient denies  Disposition: [] ED /[] Urgent Care (no appt availability in office) / [x] Appointment(In office/virtual)/ []  [facility]/ [] Home Care/ [] Refused Recommended Disposition /[] [facility]/ []  Follow-up with PCP Additional Notes: agrees with appointment.  Reason for Disposition  [1] Drinking alcohol  daily AND [2] prior DTs (delirium tremens)  Answer Assessment - Initial Assessment Questions 1. ALCOHOL  USE: "Do you drink alcohol , including beer, wine or hard liquor?"     alcohol  2. HOW OFTEN: "How many days per week do you typically drink alcohol ?"     Every day 3. HOW MUCH: "How many drinks do you typically have on days when you drink?" (1.5 oz hard liquor [one shot or jigger; 45 ml], 5 oz wine [small glass; 150 ml], 12 oz beer [one can; 360 ml])     unsure 4. MOST: "What is the most that you have had to drink on any one occasion in the last month?"     N/a 5. LAST 24 HOURS: "Have you had a drink within the last 24 hours?"     Yes 6. DRINKING PROBLEM: "Do you have or have you ever had an alcohol  drinking problem?"     no 7. DRUG PROBLEM: "Are you using any other drugs?" (e.g., yes/no; cocaine, prescription medicines, etc.)     no 8. SYMPTOMS: "What symptoms are you currently experiencing?" (e.g., none, tremors, or shakiness, abdomen pain, vomiting, blackout spells)     Shaky, dizziness 9. DETOX PROGRAM: "Have you ever gone through a detox program?"     yes 10. THERAPIST: "Do you have a counselor or therapist? Name?"        no 11. SUPPORT: "Who is with you now?" "Who do you live with?" "Do you have family or friends who you can talk to?" "Are you a member of Alcoholics Anonymous?"       yes 12. PREGNANCY: "Is there any chance you are pregnant?" "When was your last menstrual period?"       no  Protocols used: Alcohol  Use and Problems-A-AH

## 2023-12-09 ENCOUNTER — Encounter: Payer: Self-pay | Admitting: Family Medicine

## 2023-12-09 ENCOUNTER — Ambulatory Visit (INDEPENDENT_AMBULATORY_CARE_PROVIDER_SITE_OTHER): Admitting: Family Medicine

## 2023-12-09 VITALS — BP 144/88 | HR 75 | Temp 98.0°F | Wt 131.0 lb

## 2023-12-09 DIAGNOSIS — R300 Dysuria: Secondary | ICD-10-CM | POA: Diagnosis not present

## 2023-12-09 DIAGNOSIS — F10930 Alcohol use, unspecified with withdrawal, uncomplicated: Secondary | ICD-10-CM | POA: Diagnosis not present

## 2023-12-09 MED ORDER — GABAPENTIN 300 MG PO CAPS: ORAL_CAPSULE | ORAL | 0 refills | Status: DC

## 2023-12-09 MED ORDER — CEPHALEXIN 500 MG PO CAPS: 500.0000 mg | ORAL_CAPSULE | Freq: Three times a day (TID) | ORAL | 0 refills | Status: DC
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
## 2023-12-09 NOTE — Patient Instructions (Addendum)
 Return in about 2 weeks (around 12/23/2023).  Call [phone] to schedule your image of brain- [facility]      Great to see you today.  I have refilled the medication(s) we provide.   If labs were collected or images ordered, we will inform you of  results once we have received them and reviewed. We will contact you either by echart message, or telephone call.  Please give ample time to the testing facility, and our office to run,  receive and review results. Please do not call inquiring of results, even if you can see them in your chart. We will contact you as soon as we are able. If it has been over 1 week since the test was completed, and you have not yet heard from us , then please call us .    - echart message- for normal results that have been seen by the patient already.   - telephone call: abnormal results or if patient has not viewed results in their echart.  If a referral to a specialist was entered for you, please call us  in 2 weeks if you have not heard from the specialist office to schedule.

## 2023-12-12 ENCOUNTER — Ambulatory Visit (HOSPITAL_BASED_OUTPATIENT_CLINIC_OR_DEPARTMENT_OTHER)
Admission: RE | Admit: 2023-12-12 | Discharge: 2023-12-12 | Disposition: A | Discharge status: Home / Self Care | Source: Ambulatory Visit | Attending: Family Medicine | Admitting: Family Medicine

## 2023-12-12 DIAGNOSIS — R42 Dizziness and giddiness: Secondary | ICD-10-CM | POA: Insufficient documentation

## 2023-12-12 DIAGNOSIS — R55 Syncope and collapse: Secondary | ICD-10-CM | POA: Diagnosis not present

## 2023-12-12 DIAGNOSIS — S0990XD Unspecified injury of head, subsequent encounter: Secondary | ICD-10-CM | POA: Diagnosis not present

## 2023-12-13 LAB — URINALYSIS W MICROSCOPIC + REFLEX CULTURE
Bilirubin Urine: NEGATIVE
Glucose, UA: NEGATIVE
Hgb urine dipstick: NEGATIVE
Nitrites, Initial: NEGATIVE
RBC / HPF: NONE SEEN /HPF (ref 0–2)
Specific Gravity, Urine: 1.023 (ref 1.001–1.035)
pH: 5.5 (ref 5.0–8.0)

## 2023-12-13 LAB — URINE CULTURE
MICRO NUMBER:: 16482709
SPECIMEN QUALITY:: ADEQUATE

## 2023-12-13 LAB — CULTURE INDICATED

## 2023-12-16 ENCOUNTER — Ambulatory Visit: Payer: Self-pay | Admitting: Family Medicine

## 2023-12-16 NOTE — Telephone Encounter (Signed)
 Please call patient  The CT of her head is completely normal.   No identifiable cause for her continued dizziness by CT.

## 2023-12-16 NOTE — Telephone Encounter (Signed)
 Please call patient (FYI this is the second phone note for her today, the first was concerning her CT brain)  Her urine culture is positive for UTI. The antibiotic prescribed will treat her UTI appropriately.

## 2023-12-17 DIAGNOSIS — F1021 Alcohol dependence, in remission: Secondary | ICD-10-CM | POA: Diagnosis not present

## 2023-12-19 ENCOUNTER — Telehealth: Payer: Self-pay | Admitting: Family Medicine

## 2023-12-19 NOTE — Telephone Encounter (Signed)
 Copied from CRM [id number]. Topic: Clinical - Medication Refill >> Dec 19, 2023 11:40 AM Leah Jones wrote: Medication: gabapentin  (NEURONTIN ) 300 MG capsule  Has the patient contacted their pharmacy? Yes- pharmacy advise for pt to call PCP (Agent: If no, request that the patient contact the pharmacy for the refill. If patient does not wish to contact the pharmacy document the reason why and proceed with request.) (Agent: If yes, when and what did the pharmacy advise?)  This is the patient's preferred pharmacy:  [facility] [id number] - [place], [place] - [address] [place] [address] Phone: [phone] Fax: [phone]  Is this the correct pharmacy for this prescription? Yes If no, delete pharmacy and type the correct one.   Has the prescription been filled recently? No  Is the patient out of the medication? No- has 4 days left   Has the patient been seen for an appointment in the last year OR does the patient have an upcoming appointment? Yes  Can we respond through MyChart? Yes  Agent: Please be advised that Rx refills may take up to 3 business days. We ask that you follow-up with your pharmacy.

## 2023-12-23 ENCOUNTER — Inpatient Hospital Stay: Attending: Oncology | Admitting: Oncology

## 2023-12-23 ENCOUNTER — Inpatient Hospital Stay

## 2023-12-23 ENCOUNTER — Encounter: Payer: Self-pay | Admitting: Oncology

## 2023-12-23 VITALS — BP 135/94 | HR 79 | Temp 98.1°F | Resp 18 | Ht 66.0 in | Wt 130.1 lb

## 2023-12-23 DIAGNOSIS — R42 Dizziness and giddiness: Secondary | ICD-10-CM | POA: Diagnosis not present

## 2023-12-23 DIAGNOSIS — R778 Other specified abnormalities of plasma proteins: Secondary | ICD-10-CM | POA: Insufficient documentation

## 2023-12-23 DIAGNOSIS — F1020 Alcohol dependence, uncomplicated: Secondary | ICD-10-CM | POA: Diagnosis not present

## 2023-12-23 DIAGNOSIS — R790 Abnormal level of blood mineral: Secondary | ICD-10-CM | POA: Insufficient documentation

## 2023-12-23 DIAGNOSIS — F17210 Nicotine dependence, cigarettes, uncomplicated: Secondary | ICD-10-CM | POA: Insufficient documentation

## 2023-12-23 DIAGNOSIS — R970 Elevated carcinoembryonic antigen [CEA]: Secondary | ICD-10-CM | POA: Insufficient documentation

## 2023-12-23 DIAGNOSIS — Z79899 Other long term (current) drug therapy: Secondary | ICD-10-CM | POA: Diagnosis not present

## 2023-12-23 DIAGNOSIS — F10288 Alcohol dependence with other alcohol-induced disorder: Secondary | ICD-10-CM | POA: Diagnosis not present

## 2023-12-23 LAB — CMP (CANCER CENTER ONLY)
ALT: 16 U/L (ref 0–44)
AST: 27 U/L (ref 15–41)
Albumin: 4.2 g/dL (ref 3.5–5.0)
Alkaline Phosphatase: 70 U/L (ref 38–126)
Anion gap: 15 (ref 5–15)
BUN: <5 mg/dL — ABNORMAL LOW (ref 6–20)
CO2: 23 mmol/L (ref 22–32)
Calcium: 9.7 mg/dL (ref 8.9–10.3)
Chloride: 98 mmol/L (ref 98–111)
Creatinine: 0.75 mg/dL (ref 0.44–1.00)
GFR, Estimated: >60 mL/min (ref >60)
Glucose, Bld: 128 mg/dL — ABNORMAL HIGH (ref 70–99)
Potassium: 3.7 mmol/L (ref 3.5–5.1)
Sodium: 136 mmol/L (ref 135–145)
Total Bilirubin: 0.3 mg/dL (ref 0.0–1.2)
Total Protein: 7.5 g/dL (ref 6.5–8.1)

## 2023-12-23 LAB — CBC WITH DIFFERENTIAL (CANCER CENTER ONLY)
Abs Immature Granulocytes: 0.02 10*3/uL (ref 0.00–0.07)
Basophils Absolute: 0.1 10*3/uL (ref 0.0–0.1)
Basophils Relative: 2 %
Eosinophils Absolute: 0.2 10*3/uL (ref 0.0–0.5)
Eosinophils Relative: 4 %
HCT: 42.4 % (ref 36.0–46.0)
Hemoglobin: 14.3 g/dL (ref 12.0–15.0)
Immature Granulocytes: 0 %
Lymphocytes Relative: 24 %
Lymphs Abs: 1.3 10*3/uL (ref 0.7–4.0)
MCH: 32.1 pg (ref 26.0–34.0)
MCHC: 33.7 g/dL (ref 30.0–36.0)
MCV: 95.3 fL (ref 80.0–100.0)
Monocytes Absolute: 0.5 10*3/uL (ref 0.1–1.0)
Monocytes Relative: 10 %
Neutro Abs: 3.2 10*3/uL (ref 1.7–7.7)
Neutrophils Relative %: 60 %
Platelet Count: 380 10*3/uL (ref 150–400)
RBC: 4.45 MIL/uL (ref 3.87–5.11)
RDW: 12.6 % (ref 11.5–15.5)
WBC Count: 5.3 10*3/uL (ref 4.0–10.5)
nRBC: 0 % (ref 0.0–0.2)

## 2023-12-23 LAB — IRON AND TIBC
Iron: 137 ug/dL (ref 28–170)
Saturation Ratios: 42 % — ABNORMAL HIGH (ref 10.4–31.8)
TIBC: 326 ug/dL (ref 250–450)
UIBC: 189 ug/dL

## 2023-12-23 LAB — URIC ACID: Uric Acid, Serum: 2.9 mg/dL (ref 2.5–7.1)

## 2023-12-23 LAB — LACTATE DEHYDROGENASE: LDH: 146 U/L (ref 98–192)

## 2023-12-23 LAB — FERRITIN: Ferritin: 199 ng/mL (ref 11–307)

## 2023-12-23 NOTE — Progress Notes (Signed)
 [facility]  HEMATOLOGY CLINIC CONSULTATION NOTE   PATIENT NAME: Leah Jones   MR#: [id number] DOB: 03-04-1961  DATE OF SERVICE: 12/23/2023   REFERRING PROVIDER  Leah Shope, DO   Patient Care Team: Leah Shope, DO as PCP - General (Family Medicine) Obgyn, Leah Jones   REASON FOR CONSULTATION/ CHIEF COMPLAINT:  Abnormal iron saturation  ASSESSMENT & PLAN:  Leah Jones is a 63 y.o. lady with a past medical history of hypertension, depression, alcohol  dependence, was referred to our service for evaluation of increased iron saturation.    Serum iron raised Elevated total iron and iron saturation levels. Differential diagnosis includes hemochromatosis and inflammation. No family history of iron issues. No new medications likely to cause iron overload. Discussed potential for iron overload due to hemochromatosis, which can lead to organ damage if untreated.  Repeat labs today actually showed much improved iron saturation of 42%.  Iron is normal at 137 currently.  Ferritin normal at 199.  Uric acid, LDH, CMP, CBC are all unremarkable.  We did request HFE gene mutation analysis to rule out underlying hemochromatosis, although clinically this seems to be less likely now.  - Advised that there are no contraindications for blood donation.  I will call her in approximately 2 weeks to discuss results of remaining workup.  RTC in 4 months for follow-up with repeat labs.  Abnormal SPEP She has a history of abnormal SPEP with M spike of 0.2 g/dL in May 4098.  Previously 24-hour urine protein electrophoresis and immunoelectrophoresis was unremarkable.  Likely MGUS.  We will repeat labs today including SPEP, IFE, quantitate immunoglobulins, serum free light chains.  Dizziness Intermittent dizziness and imbalance, worsened over time. Symptoms include pressure in the head and unsteadiness, especially when standing up. Recent CT head performed due to near syncope  episodes. No consistent headaches associated with dizziness. Symptoms may be related to alcohol  use and blood pressure fluctuations  Alcohol  dependence (HCC) Patient seems to have relapsed with her alcohol  use unfortunately.  Previously she was able to quit alcohol  but she is going through a bad break-up.  Per PCPs notes, she is attending AA meetings.  Encouraged to continue to limit alcohol  use.   I reviewed lab results and outside records for this visit and discussed relevant results with the patient. Diagnosis, plan of care and treatment options were also discussed in detail with the patient. Opportunity provided to ask questions and answers provided to her apparent satisfaction. Provided instructions to call our clinic with any problems, questions or concerns prior to return visit. I recommended to continue follow-up with PCP and sub-specialists. She verbalized understanding and agreed with the plan. No barriers to learning was detected.  Leah Berber, MD  12/23/2023 5:15 PM  [facility] - A DEPT OF [facility] [address] [place] [address] Dept: [phone] Dept Fax: [phone]   HISTORY OF PRESENT ILLNESS:  Discussed the use of AI scribe software for clinical note transcription with the patient, who gave verbal consent to proceed.  History of Present Illness Leah Jones is a 63 year old female who presents with abnormal iron labs indicating potential iron overload. She was referred by Dr. Kerri Jones for evaluation of abnormal iron labs.  She presents with abnormal iron labs, including a total iron level of 203 mcg/dL and an iron saturation of 56%. She is not taking any iron supplements and has not been consuming iron-rich  foods. There is no known family history of iron-related issues. Recent medication changes include the addition of Cymbalta  for depression and Norvasc  (amlodipine ) for blood pressure,  which she has been on for about a year.  She describes episodes of dizziness and near syncope, particularly upon standing, accompanied by a sensation of pressure in her head. These episodes have been frequent and concerning, occurring when she rises from bed or a chair, necessitating slow movements to prevent falls. She reports a worsening of balance issues, feeling 'very unbalanced' and 'shaky', requiring support in the shower to prevent falls. A CT head scan was performed approximately ten days ago following an episode of dizziness and near syncope, but no further details are provided about the results.  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 She smokes cigarettes, currently about a pack every other day, and consumes alcohol  daily, averaging about three White Claws per day. She is currently experiencing personal stress due to a breakup. She has a history of a slightly elevated M protein, for which she underwent a 24-hour urine collection, but she has not followed up on this issue recently.   MEDICAL HISTORY Past Medical History:  Diagnosis Date   ADD (attention deficit disorder)    ? alcohol  related   Alcohol  dependence, daily use (HCC) 11/17/2018   Alcoholism (HCC)    per record   Allergic reaction 09/24/2022   ANAL FISSURE, HX OF 04/15/2007   Annotation: occasional rectal bleeding Qualifier: Diagnosis of  By: Leah Banks MD, Leah Ket.    Angio-edema    B12 deficiency    Carpal tunnel syndrome    Carpal tunnel syndrome 12/01/2009   Qualifier: Diagnosis of  By: Leah Banks MD, Leah E.    COVID-19 virus infection 08/23/2019   Diarrhea 11/17/2018   Discoloration of skin of foot 05/20/2014   Purplish discoloration top of L foot    Eczema    Eustachian tube dysfunction 01/25/2014   Mallet deformity of fifth finger, left, acquired 06/19/2015   Suicidal ideation    Urticaria      SURGICAL HISTORY Past Surgical History:  Procedure Laterality Date   COLPOSCOPY  2020    pt reported benign.    FOOT SURGERY Left    Morton's neuroma, Dr Leah Jones   PROBING OF NASOLACRIMAL DUCT, SILICONE TUBE, WITH OR WITHOUT IRRIGATION; WITH INSERTION OF TUBE OR STENT  09/24/2023   RHINOPLASTY     TONSILLECTOMY       SOCIAL HISTORY: She reports that she has been smoking cigarettes. She has never used smokeless tobacco. She reports current alcohol  use. She reports that she does not use drugs. Social History   Socioeconomic History   Marital status: Divorced    Spouse name: Not on file   Number of children: 3   Years of education: Not on file   Highest education level: Not on file  Occupational History   Occupation: Public affairs consultant: [employer]  Tobacco Use   Smoking status: Some Days    Current packs/day: 0.50    Types: Cigarettes   Smokeless tobacco: Never  Vaping Use   Vaping status: Never Used  Substance and Sexual Activity   Alcohol  use: Yes    Comment: Drinking 6 days a week. Drinks a bottle of wine, beer, or liquor    Drug use: No   Sexual activity: Not on file  Other Topics Concern   Not on file  Social History Narrative  G5 P3, 3 miscarriages   Social Drivers of Corporate investment banker Strain: Not on file  Food Insecurity: No Food Insecurity (12/11/2022)   Hunger Vital Sign    Worried About Running Out of Food in the Last Year: Never true    Ran Out of Food in the Last Year: Never true  Transportation Needs: No Transportation Needs (12/11/2022)   PRAPARE - Administrator, Civil Service (Medical): No    Lack of Transportation (Non-Medical): No  Physical Activity: Not on file  Stress: Not on file  Social Connections: Not on file  Intimate Partner Violence: Not At Risk (12/11/2022)   Humiliation, Afraid, Rape, and Kick questionnaire    Fear of Current or Ex-Partner: No    Emotionally Abused: No    Physically Abused: No    Sexually Abused: No    FAMILY HISTORY: Her family history includes ADD / ADHD in her  sister; Alcoholism in her father; Anxiety disorder in her sister; Asthma in her daughter, mother, sister, and son; Bipolar disorder in her brother and son; Breast cancer in her mother; Celiac disease in her mother; Colon cancer in her mother; Colon polyps in her brother, father, mother, and sister; Diabetes in an other family member; Eczema in her son; Hypertension in an other family member; Lung cancer in her paternal grandmother; Pancreatic cancer in her brother and paternal grandfather; Stomach cancer in her mother.  CURRENT MEDICATIONS   Current Outpatient Medications  Medication Instructions   amLODipine  (NORVASC ) 5 mg, Oral, Daily   atomoxetine  (STRATTERA ) 40 mg, Every morning   b complex vitamins capsule Take by mouth.   cetirizine  (ZYRTEC ) 10 mg, Oral, Daily at bedtime   DULoxetine  (CYMBALTA ) 60 mg, Oral, Daily   EPINEPHrine  (EPI-PEN) 0.3 mg, Intramuscular, As needed   estradiol  (ESTRACE ) 1 mg, Daily   fluticasone  (FLONASE ) 50 MCG/ACT nasal spray 1 spray, Daily   folic acid  (FOLVITE ) 1 mg, Oral, Daily   gabapentin  (NEURONTIN ) 300 MG capsule 1 tab PO every 8 hours for 3 days, then 1 tab every 12 hours for 3 days, then 1 tab PO at bedtime 7 days   guanFACINE  (TENEX ) 1 mg, Oral, Daily at bedtime   medroxyPROGESTERone (PROVERA) 2.5 mg, Daily   Melatonin 3 MG SUBL 1 tablet, At bedtime PRN   naltrexone  (DEPADE) 100 mg, Oral, Daily at bedtime   propranolol  ER (INDERAL  LA) 120 mg, Oral, Daily     ALLERGIES  She is allergic to bactrim [sulfamethoxazole-trimethoprim ], sulfa antibiotics, lisinopril , and seroquel  [quetiapine  fumarate].  REVIEW OF SYSTEMS:  Review of Systems - Oncology   Rest of the pertinent review of systems is unremarkable except as mentioned above in HPI.  PHYSICAL EXAMINATION:    Onc Performance Status - 12/23/23 1019       ECOG Perf Status   ECOG Perf Status Fully active, able to carry on all pre-disease performance without restriction      KPS SCALE   KPS %  SCORE Able to carry on normal activity, minor s/s of disease             Vitals:   12/23/23 1006 12/23/23 1010  BP: (!) 136/93 (!) 135/94  Pulse: 79   Resp: 18   Temp: 98.1 F (36.7 C)   SpO2: 98%    Filed Weights   12/23/23 1006  Weight: 130 lb 1.6 oz (59 kg)    Physical Exam Constitutional:      General: She is not in acute distress.  Appearance: Normal appearance.  HENT:     Head: Normocephalic and atraumatic.  Cardiovascular:     Rate and Rhythm: Normal rate.     Heart sounds: Normal heart sounds.  Pulmonary:     Effort: Pulmonary effort is normal. No respiratory distress.     Breath sounds: Normal breath sounds.  Abdominal:     General: There is no distension.  Neurological:     General: No focal deficit present.     Mental Status: She is alert and oriented to person, place, and time.  Psychiatric:        Mood and Affect: Mood normal.        Behavior: Behavior normal.      LABORATORY DATA:   I have reviewed the data as listed.  Results for orders placed or performed in visit on 12/23/23  Uric acid  Result Value Ref Range   Uric Acid, Serum 2.9 2.5 - 7.1 mg/dL  Lactate dehydrogenase  Result Value Ref Range   LDH 146 98 - 192 U/L  Ferritin  Result Value Ref Range   Ferritin 199 11 - 307 ng/mL  Iron and TIBC  Result Value Ref Range   Iron 137 28 - 170 ug/dL   TIBC 098 119 - 147 ug/dL   Saturation Ratios 42 (H) 10.4 - 31.8 %   UIBC 189 ug/dL  CMP (Cancer Center only)  Result Value Ref Range   Sodium 136 135 - 145 mmol/L   Potassium 3.7 3.5 - 5.1 mmol/L   Chloride 98 98 - 111 mmol/L   CO2 23 22 - 32 mmol/L   Glucose, Bld 128 (H) 70 - 99 mg/dL   BUN <5 (L) 6 - 20 mg/dL   Creatinine 8.29 5.62 - 1.00 mg/dL   Calcium  9.7 8.9 - 10.3 mg/dL   Total Protein 7.5 6.5 - 8.1 g/dL   Albumin 4.2 3.5 - 5.0 g/dL   AST 27 15 - 41 U/L   ALT 16 0 - 44 U/L   Alkaline Phosphatase 70 38 - 126 U/L   Total Bilirubin 0.3 0.0 - 1.2 mg/dL   GFR, Estimated >13  >08 mL/min   Anion gap 15 5 - 15  CBC with Differential (Cancer Center Only)  Result Value Ref Range   WBC Count 5.3 4.0 - 10.5 K/uL   RBC 4.45 3.87 - 5.11 MIL/uL   Hemoglobin 14.3 12.0 - 15.0 g/dL   HCT 65.7 84.6 - 96.2 %   MCV 95.3 80.0 - 100.0 fL   MCH 32.1 26.0 - 34.0 pg   MCHC 33.7 30.0 - 36.0 g/dL   RDW 95.2 84.1 - 32.4 %   Platelet Count 380 150 - 400 K/uL   nRBC 0.0 0.0 - 0.2 %   Neutrophils Relative % 60 %   Neutro Abs 3.2 1.7 - 7.7 K/uL   Lymphocytes Relative 24 %   Lymphs Abs 1.3 0.7 - 4.0 K/uL   Monocytes Relative 10 %   Monocytes Absolute 0.5 0.1 - 1.0 K/uL   Eosinophils Relative 4 %   Eosinophils Absolute 0.2 0.0 - 0.5 K/uL   Basophils Relative 2 %   Basophils Absolute 0.1 0.0 - 0.1 K/uL   Immature Granulocytes 0 %   Abs Immature Granulocytes 0.02 0.00 - 0.07 K/uL     RADIOGRAPHIC STUDIES:  I have personally reviewed the radiological images as listed and agreed with the findings in the report.  CT HEAD WO CONTRAST ( ) Result Date: 12/12/2023 CLINICAL DATA:  Syncope/presyncope.  Postural dizziness. EXAM: CT HEAD WITHOUT CONTRAST TECHNIQUE: Contiguous axial images were obtained from the base of the skull through the vertex without intravenous contrast. RADIATION DOSE REDUCTION: This exam was performed according to the departmental dose-optimization program which includes automated exposure control, adjustment of the mA and/or kV according to patient size and/or use of iterative reconstruction technique. COMPARISON:  None Available. FINDINGS: Brain: No acute intracranial hemorrhage. No CT evidence of acute infarct. No edema, mass effect, or midline shift. The basilar cisterns are patent. Ventricles: The ventricles are normal. Vascular: No hyperdense vessel or unexpected calcification. Skull: No acute or aggressive finding. Orbits: Orbits are symmetric. Sinuses: Visualized paranasal sinuses are clear. Other: Visualized mastoid air cells are clear. IMPRESSION: No CT  evidence of acute intracranial abnormality. Electronically Signed   By: Denny Flack M.D.   On: 12/12/2023 15:46    Orders Placed This Encounter  Procedures   CBC with Differential (Cancer Center Only)    Standing Status:   Future    Number of Occurrences:   1    Expiration Date:   12/22/2024   CMP (Cancer Center only)    Standing Status:   Future    Number of Occurrences:   1    Expiration Date:   12/22/2024   Iron and TIBC    Standing Status:   Future    Number of Occurrences:   1    Expiration Date:   12/22/2024   Ferritin    Standing Status:   Future    Number of Occurrences:   1    Expiration Date:   12/22/2024   Lactate dehydrogenase    Standing Status:   Future    Number of Occurrences:   1    Expiration Date:   12/22/2024   Uric acid    Standing Status:   Future    Number of Occurrences:   1    Expiration Date:   12/22/2024   Multiple Myeloma Panel (SPEP&IFE w/QIG)    Standing Status:   Future    Number of Occurrences:   1    Expiration Date:   12/22/2024   Kappa/lambda light chains    Standing Status:   Future    Number of Occurrences:   1    Expiration Date:   12/22/2024   Hemochromatosis DNA, PCR    Standing Status:   Future    Number of Occurrences:   1    Expiration Date:   12/22/2024    Future Appointments  Date Time Provider Department Center  12/25/2023 10:20 AM Leah Shope, DO [facility]  01/06/2024  3:15 PM Dashton Czerwinski, Gale Jude, MD [facility] None  03/26/2024  2:40 PM Sheryle Donning, MD [facility]  04/27/2024 10:15 AM [facility] PHLEBOTOMIST [facility] None  04/27/2024 10:45 AM Kaiyon Hynes, Gale Jude, MD [facility] None    I spent a total of 55 minutes during this encounter with the patient including review of chart and various tests results, discussions about plan of care and coordination of care plan.  This document was completed utilizing speech recognition software. Grammatical errors, random word insertions, pronoun errors, and incomplete sentences are an occasional  consequence of this system due to software limitations, ambient noise, and hardware issues. Any formal questions or concerns about the content, text or information contained within the body of this dictation should be directly addressed to the provider for clarification.

## 2023-12-23 NOTE — Assessment & Plan Note (Signed)
 Patient seems to have relapsed with her alcohol  use unfortunately.  Previously she was able to quit alcohol  but she is going through a bad break-up.  Per PCPs notes, she is attending AA meetings.  Encouraged to continue to limit alcohol  use.

## 2023-12-23 NOTE — Assessment & Plan Note (Signed)
 She has a history of abnormal SPEP with M spike of 0.2 g/dL in May 0454.  Previously 24-hour urine protein electrophoresis and immunoelectrophoresis was unremarkable.  Likely MGUS.  We will repeat labs today including SPEP, IFE, quantitate immunoglobulins, serum free light chains.

## 2023-12-23 NOTE — Assessment & Plan Note (Signed)
 Intermittent dizziness and imbalance, worsened over time. Symptoms include pressure in the head and unsteadiness, especially when standing up. Recent CT head performed due to near syncope episodes. No consistent headaches associated with dizziness. Symptoms may be related to alcohol  use and blood pressure fluctuations

## 2023-12-23 NOTE — Assessment & Plan Note (Signed)
 Elevated total iron and iron saturation levels. Differential diagnosis includes hemochromatosis and inflammation. No family history of iron issues. No new medications likely to cause iron overload. Discussed potential for iron overload due to hemochromatosis, which can lead to organ damage if untreated.  Repeat labs today actually showed much improved iron saturation of 42%.  Iron is normal at 137 currently.  Ferritin normal at 199.  Uric acid, LDH, CMP, CBC are all unremarkable.  We did request HFE gene mutation analysis to rule out underlying hemochromatosis, although clinically this seems to be less likely now.  - Advised that there are no contraindications for blood donation.  I will call her in approximately 2 weeks to discuss results of remaining workup.  RTC in 4 months for follow-up with repeat labs.

## 2023-12-24 LAB — KAPPA/LAMBDA LIGHT CHAINS
Kappa free light chain: 18.7 mg/L (ref 3.3–19.4)
Kappa, lambda light chain ratio: 0.77 (ref 0.26–1.65)
Lambda free light chains: 24.2 mg/L (ref 5.7–26.3)

## 2023-12-24 MED ORDER — GABAPENTIN 300 MG PO CAPS: 300.0000 mg | ORAL_CAPSULE | Freq: Three times a day (TID) | ORAL | 5 refills | Status: DC

## 2023-12-24 NOTE — Telephone Encounter (Signed)
 Please call patient we need to make sure she understands the instructions on this medication. Although I had prescribed gabapentin  as a taper during her detox phase, she is requesting refills on this medication.  I am okay with her staying on this medication long-term instead of tapering if she desires.  However she needs to understand that this medication is every 8 hours.

## 2023-12-24 NOTE — Addendum Note (Signed)
 Addended by: Isa Kohlenberg A on: 12/24/2023 04:32 PM   Modules accepted: Orders

## 2023-12-24 NOTE — Telephone Encounter (Signed)
 Patient has ran out of the medication she was taking it every 6 hours instead of every 8 hours she is requesting a refill she is still having the shakes

## 2023-12-25 ENCOUNTER — Ambulatory Visit (INDEPENDENT_AMBULATORY_CARE_PROVIDER_SITE_OTHER): Payer: Self-pay | Admitting: Family Medicine

## 2023-12-25 DIAGNOSIS — Z91199 Patient's noncompliance with other medical treatment and regimen due to unspecified reason: Secondary | ICD-10-CM

## 2023-12-25 LAB — MULTIPLE MYELOMA PANEL, SERUM
Albumin SerPl Elph-Mcnc: 3.4 g/dL (ref 2.9–4.4)
Albumin/Glob SerPl: 1.1 (ref 0.7–1.7)
Alpha 1: 0.3 g/dL (ref 0.0–0.4)
Alpha2 Glob SerPl Elph-Mcnc: 0.7 g/dL (ref 0.4–1.0)
B-Globulin SerPl Elph-Mcnc: 0.9 g/dL (ref 0.7–1.3)
Gamma Glob SerPl Elph-Mcnc: 1.2 g/dL (ref 0.4–1.8)
Globulin, Total: 3.1 g/dL (ref 2.2–3.9)
IgA: 229 mg/dL (ref 87–352)
IgG (Immunoglobin G), Serum: 1072 mg/dL (ref 586–1602)
IgM (Immunoglobulin M), Srm: 174 mg/dL (ref 26–217)
Total Protein ELP: 6.5 g/dL (ref 6.0–8.5)

## 2023-12-25 NOTE — Progress Notes (Signed)
   Same day cancel

## 2023-12-25 NOTE — Telephone Encounter (Signed)
 Pt made aware

## 2023-12-26 ENCOUNTER — Ambulatory Visit (INDEPENDENT_AMBULATORY_CARE_PROVIDER_SITE_OTHER): Admitting: Family Medicine

## 2023-12-26 DIAGNOSIS — Z91199 Patient's noncompliance with other medical treatment and regimen due to unspecified reason: Secondary | ICD-10-CM

## 2023-12-26 NOTE — Progress Notes (Signed)
     Patient showed up for appointment 16 minutes late, unable to accommodate her visit at that time.  Patient rescheduled

## 2023-12-29 ENCOUNTER — Ambulatory Visit: Admitting: Family Medicine

## 2023-12-29 ENCOUNTER — Ambulatory Visit (INDEPENDENT_AMBULATORY_CARE_PROVIDER_SITE_OTHER): Admitting: Family Medicine

## 2023-12-29 ENCOUNTER — Encounter: Payer: Self-pay | Admitting: Family Medicine

## 2023-12-29 VITALS — BP 140/84 | HR 78 | Temp 98.2°F | Wt 128.0 lb

## 2023-12-29 DIAGNOSIS — I10 Essential (primary) hypertension: Secondary | ICD-10-CM

## 2023-12-29 DIAGNOSIS — F10288 Alcohol dependence with other alcohol-induced disorder: Secondary | ICD-10-CM | POA: Diagnosis not present

## 2023-12-29 DIAGNOSIS — F10930 Alcohol use, unspecified with withdrawal, uncomplicated: Secondary | ICD-10-CM

## 2023-12-29 DIAGNOSIS — G5793 Unspecified mononeuropathy of bilateral lower limbs: Secondary | ICD-10-CM | POA: Diagnosis not present

## 2023-12-29 LAB — HEMOCHROMATOSIS DNA-PCR(C282Y,H63D)

## 2023-12-29 MED ORDER — AMLODIPINE BESYLATE 10 MG PO TABS: 10.0000 mg | ORAL_TABLET | Freq: Every day | ORAL | 1 refills | Status: DC

## 2023-12-29 NOTE — Patient Instructions (Addendum)
 No follow-ups on file. Decrease gabapentin  to 1 tab every 12 hours (one of those before bed). This is why you are tired. Continue AA meetings.  Increase amlodipine  to 10 mg a day.  HYDRATE!!! At least 80 ounces of water a day.  Miralax   1 cap in water daily can help with constipation.          Great to see you today.  I have refilled the medication(s) we provide.   If labs were collected or images ordered, we will inform you of  results once we have received them and reviewed. We will contact you either by echart message, or telephone call.  Please give ample time to the testing facility, and our office to run,  receive and review results. Please do not call inquiring of results, even if you can see them in your chart. We will contact you as soon as we are able. If it has been over 1 week since the test was completed, and you have not yet heard from us , then please call us .    - echart message- for normal results that have been seen by the patient already.   - telephone call: abnormal results or if patient has not viewed results in their echart.  If a referral to a specialist was entered for you, please call us  in 2 weeks if you have not heard from the specialist office to schedule.
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
## 2023-12-31 DIAGNOSIS — F1021 Alcohol dependence, in remission: Secondary | ICD-10-CM | POA: Diagnosis not present

## 2024-01-01 ENCOUNTER — Ambulatory Visit: Payer: Self-pay

## 2024-01-01 NOTE — Telephone Encounter (Signed)
 FYI

## 2024-01-01 NOTE — Telephone Encounter (Signed)
 FYI Only or Action Required?: Action required by provider  Patient was last seen in primary care on 12/29/2023 by Napolean Backbone A, DO. Called Nurse Triage reporting Loss of Consciousness. Symptoms began today. Interventions attempted: Nothing. Symptoms are: unchanged.  Triage Disposition: Call EMS 911 Now  Patient/caregiver understands and will follow disposition?: No, wishes to speak with PCP  Copied from CRM [id number]. Topic: Clinical - Red Word Triage >> Jan 01, 2024  4:31 PM Shereese L wrote: [facility] that prompted transfer to Nurse Triage: patient took new blood pressure medication and passed out 3 times and stated she has a black eye and a cut on her arm and bruises from all the times she fell. Reason for Disposition  Fainted suddenly after medicine, allergic food or bee sting  Answer Assessment - Initial Assessment Questions 1. ONSET: How long were you unconscious? (minutes) When did it happen?     unsure  2. INJURY: Did you sustain any injury during the fall?  Black eye, lacerations on arm, bruising.         Additional info: refusing ER or this writer to call 911. She wants Dr. Marylee Snowball to adjust medication  Protocols used: [facility]

## 2024-01-05 DIAGNOSIS — M79601 Pain in right arm: Secondary | ICD-10-CM | POA: Diagnosis not present

## 2024-01-05 DIAGNOSIS — S43401A Unspecified sprain of right shoulder joint, initial encounter: Secondary | ICD-10-CM | POA: Diagnosis not present

## 2024-01-05 DIAGNOSIS — M79645 Pain in left finger(s): Secondary | ICD-10-CM | POA: Diagnosis not present

## 2024-01-05 DIAGNOSIS — S52031A Displaced fracture of olecranon process with intraarticular extension of right ulna, initial encounter for closed fracture: Secondary | ICD-10-CM | POA: Diagnosis not present

## 2024-01-05 DIAGNOSIS — W108XXA Fall (on) (from) other stairs and steps, initial encounter: Secondary | ICD-10-CM | POA: Diagnosis not present

## 2024-01-06 ENCOUNTER — Inpatient Hospital Stay (HOSPITAL_BASED_OUTPATIENT_CLINIC_OR_DEPARTMENT_OTHER): Admitting: Oncology

## 2024-01-06 ENCOUNTER — Encounter: Payer: Self-pay | Admitting: Oncology

## 2024-01-06 DIAGNOSIS — R778 Other specified abnormalities of plasma proteins: Secondary | ICD-10-CM

## 2024-01-06 DIAGNOSIS — F10288 Alcohol dependence with other alcohol-induced disorder: Secondary | ICD-10-CM

## 2024-01-06 DIAGNOSIS — R790 Abnormal level of blood mineral: Secondary | ICD-10-CM

## 2024-01-06 NOTE — Assessment & Plan Note (Signed)
 History of elevated total iron and iron saturation levels.   No family history of iron issues. No new medications likely to cause iron overload.    On her consultation with us  on 12/23/2023, labs actually showed much improved iron saturation of 42%.  Iron was normal at 137 currently.  Ferritin normal at 199.  Uric acid, LDH, CMP, CBC were all unremarkable.   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  S65C mutation is not associated with increased risk to develop clinical symptoms of hereditary hemochromatosis.  - Advised that there are no contraindications for blood donation.  RTC in 4 months for follow-up with repeat labs.

## 2024-01-06 NOTE — Assessment & Plan Note (Signed)
 She has a history of abnormal SPEP with M spike of 0.2 g/dL in May 2355.  Previously 24-hour urine protein electrophoresis and immunoelectrophoresis was unremarkable.  On 12/23/2023, SPEP showed no evidence of M spike.  Quantitative immunoglobulins were within normal limits.  Serum free light chains were also normal.  No evidence of monoclonal gammopathy.

## 2024-01-06 NOTE — Progress Notes (Signed)
 [facility]  HEMATOLOGY-ONCOLOGY ELECTRONIC VISIT PROGRESS NOTE  PATIENT NAME: Leah Jones   MR#: [id number] DOB: 04/22/1961  DATE OF SERVICE: 01/06/2024  Patient Care Team: Mariel Shope, DO as PCP - General (Family Medicine) Obgyn, Corbin Dess  I connected with the patient via telephone conference and verified that I am speaking with the correct person using two identifiers. The patient's location is at home and I am providing care from the [facility].  I discussed the limitations, risks, security and privacy concerns of performing an evaluation and management service by e-visits and the availability of in person appointments. I also discussed with the patient that there may be a patient responsible charge related to this service. The patient expressed understanding and agreed to proceed.   ASSESSMENT & PLAN:   Leah Jones is a 63 y.o. lady with a past medical history of hypertension, depression, alcohol  dependence, was referred to our service for evaluation of increased iron saturation.    Serum iron raised History of elevated total iron and iron saturation levels.   No family history of iron issues. No new medications likely to cause iron overload.    On her consultation with us  on 12/23/2023, labs actually showed much improved iron saturation of 42%.  Iron was normal at 137 currently.  Ferritin normal at 199.  Uric acid, LDH, CMP, CBC were all unremarkable.   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  S65C mutation is not associated with increased risk to develop clinical symptoms of hereditary hemochromatosis.  - Advised that there are no contraindications for blood donation.  RTC in 4 months for follow-up with repeat labs.  Abnormal SPEP She has a history of abnormal SPEP with M spike of 0.2 g/dL in May 4098.  Previously 24-hour urine protein electrophoresis and immunoelectrophoresis was  unremarkable.  On 12/23/2023, SPEP showed no evidence of M spike.  Quantitative immunoglobulins were within normal limits.  Serum free light chains were also normal.  No evidence of monoclonal gammopathy.  Alcohol  dependence (HCC) Patient seems to have relapsed with her alcohol  use unfortunately.  Previously she was able to quit alcohol  but she is going through a bad break-up.  Per PCPs notes, she is attending AA meetings.  Encouraged to continue to limit alcohol  use.    I discussed the assessment and treatment plan with the patient. The patient was provided an opportunity to ask questions and all were answered. The patient agreed with the plan and demonstrated an understanding of the instructions. The patient was advised to call back or seek an in-person evaluation if the symptoms worsen or if the condition fails to improve as anticipated.    I spent 12 minutes over the phone with the patient reviewing test results, discuss management and coordination/planning of care.  Arlo Berber, MD 01/06/2024 5:19 PM [facility] - A DEPT OF [facility] [address] [place] [address] Dept: [phone] Dept Fax: [phone]   INTERVAL HISTORY:  Please see above for problem oriented charting.  The purpose of today's discussion is to explain recent lab results and to formulate plan of care.  Discussed the use of AI scribe software for clinical note transcription with the patient, who gave verbal consent to proceed.  History of Present Illness Leah Jones is a 63 year old female who presents for follow-up regarding iron overload evaluation. She is accompanied by  her son. She was referred by her primary doctor for evaluation of potential iron overload.  She underwent mutation testing, which revealed one positive mutation out of three tested. Her iron levels have improved since the last evaluation, with iron saturation  decreasing from 56% to 42%. Her iron levels are currently within normal limits.  Alcohol  use was discussed as a possible factor affecting her iron levels and inflammation. Her son expressed concerns about her alcohol  consumption while on medication.  She inquired about her folic acid  and protein levels, which were reported to be normal.   SUMMARY OF HEMATOLOGY HISTORY:  She was referred by Dr. Kerri Peed for evaluation of abnormal iron labs.   She presents with abnormal iron labs, including a total iron level of 203 mcg/dL and an iron saturation of 56%. She is not taking any iron supplements and has not been consuming iron-rich foods. There is no known family history of iron-related issues. Recent medication changes include the addition of Cymbalta  for depression and Norvasc  (amlodipine ) for blood pressure, which she has been on for about a year.   She describes episodes of dizziness and near syncope, particularly upon standing, accompanied by a sensation of pressure in her head. These episodes have been frequent and concerning, occurring when she rises from bed or a chair, necessitating slow movements to prevent falls. She reports a worsening of balance issues, feeling 'very unbalanced' and 'shaky', requiring support in the shower to prevent falls. A CT head scan was performed approximately ten days ago following an episode of dizziness and near syncope, but no further details are provided about the results.   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 She smokes cigarettes, currently about a pack every other day, and consumes alcohol  daily, averaging about three White Claws per day. She is currently experiencing personal stress due to a breakup. She has a history of a slightly elevated M protein, for which she underwent a 24-hour urine collection, but she has not followed up on this issue recently.  No family history of iron issues. No new medications likely to  cause iron overload.    On her consultation with us  on 12/23/2023, labs actually showed much improved iron saturation of 42%.  Iron was normal at 137 currently.  Ferritin normal at 199.  Uric acid, LDH, CMP, CBC were all unremarkable.   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  S65C mutation is not associated with increased risk to develop clinical symptoms of hereditary hemochromatosis.  - Advised that there are no contraindications for blood donation.  She has a history of abnormal SPEP with M spike of 0.2 g/dL in May 2952.  Previously 24-hour urine protein electrophoresis and immunoelectrophoresis was unremarkable.  On 12/23/2023, SPEP showed no evidence of M spike.  Quantitative immunoglobulins were within normal limits.  Serum free light chains were also normal.  No evidence of monoclonal gammopathy.  REVIEW OF SYSTEMS:    Review of Systems - Oncology  All other pertinent systems were reviewed with the patient and are negative.  I have reviewed the past medical history, past surgical history, social history and family history with the patient and they are unchanged from previous note.  ALLERGIES:  She is allergic to bactrim [sulfamethoxazole-trimethoprim ], sulfa antibiotics, lisinopril , and seroquel  [quetiapine  fumarate].  MEDICATIONS:  Current Outpatient Medications  Medication Sig Dispense Refill   amLODipine  (NORVASC ) 10 MG tablet Take 1 tablet (10  mg total) by mouth daily. 90 tablet 1   atomoxetine  (STRATTERA ) 40 MG capsule Take 40 mg by mouth every morning.     b complex vitamins capsule Take by mouth.     cetirizine  (ZYRTEC ) 10 MG tablet Take 1 tablet (10 mg total) by mouth at bedtime. 90 tablet 3   DULoxetine  (CYMBALTA ) 60 MG capsule Take 1 capsule (60 mg total) by mouth daily. 90 capsule 1   EPINEPHrine  0.3 mg/0.3 mL IJ SOAJ injection Inject 0.3 mg into the muscle as needed for anaphylaxis. 2 each 1    estradiol  (ESTRACE ) 1 MG tablet Take 1 mg by mouth daily.     fluticasone  (FLONASE ) 50 MCG/ACT nasal spray 1 spray daily.     folic acid  (FOLVITE ) 1 MG tablet Take 1 tablet (1 mg total) by mouth daily. 90 tablet 1   gabapentin  (NEURONTIN ) 300 MG capsule Take 1 capsule (300 mg total) by mouth 3 (three) times daily. 90 capsule 5   guanFACINE  (TENEX ) 1 MG tablet Take 1 tablet (1 mg total) by mouth at bedtime. 90 tablet 1   medroxyPROGESTERone (PROVERA) 2.5 MG tablet Take 2.5 mg by mouth daily.     Melatonin 3 MG SUBL Take 1 tablet by mouth at bedtime as needed.     naltrexone  (DEPADE) 50 MG tablet Take 2 tablets (100 mg total) by mouth at bedtime. 180 tablet 1   propranolol  ER (INDERAL  LA) 120 MG 24 hr capsule Take 1 capsule (120 mg total) by mouth daily. 90 capsule 1   No current facility-administered medications for this visit.    PHYSICAL EXAMINATION:    Onc Performance Status - 01/06/24 1700       ECOG Perf Status   ECOG Perf Status Restricted in physically strenuous activity but ambulatory and able to carry out work of a light or sedentary nature, e.g., light house work, office work      KPS SCALE   KPS % SCORE Able to carry on normal activity, minor s/s of disease          LABORATORY DATA:   I have reviewed the data as listed.  Recent Results (from the past 2160 hours)  Surgical pathology ([facility])     Status: None   Collection Time: 10/16/23 12:00 AM  Result Value Ref Range   SURGICAL PATHOLOGY      SURGICAL PATHOLOGY [facility] [address], [address], [place] [address] Telephone [phone] or [phone] Fax [phone]  REPORT OF SURGICAL PATHOLOGY   Accession #: [id number] Patient Name: Leah Jones, Leah Jones Visit # : [id number]  MRN: [id number] Physician: Darol Elizabeth DOB/Age April 02, 1961 (Age: 32) Gender: F Collected Date: 10/16/2023 Received Date: 10/17/2023  FINAL DIAGNOSIS       1. Surgical [P], colon,  descending, sigmoid, polyp (2) :       FRAGMENTS OF TUBULAR ADENOMA.      NEGATIVE FOR HIGH-GRADE DYSPLASIA.       DATE SIGNED OUT: 10/20/2023 ELECTRONIC SIGNATURE : Dillard Frame Md, Pathologist, Electronic Signature  MICROSCOPIC DESCRIPTION  CASE COMMENTS STAINS USED IN DIAGNOSIS: H&E    CLINICAL HISTORY  SPECIMEN(S) OBTAINED 1. Surgical [P], Colon, Descending, Sigmoid, Polyp (2)  SPECIMEN COMMENTS: 1. Special screening for malignant neoplasms, colon; benign neoplasm of descending colon; benign neopla sm of sigmoid colon SPECIMEN CLINICAL INFORMATION: 1. R/O adenoma    Gross Description 1. Received in formalin are tan, soft tissue fragments that are submitted in toto. Number: 2, Size: 0.5 cm  smallest to 0.6 cm largest, (1B) ( TA )        Report signed out from the following location(s) [facility] [address], [place] [address] CLIA #: [id number]  [facility] [address] [place], [place] [address] CLIA #: [id number]   Comp Met (CMET)     Status: Abnormal   Collection Time: 11/14/23 10:59 AM  Result Value Ref Range   Sodium 137 135 - 145 mEq/L   Potassium 4.4 3.5 - 5.1 mEq/L   Chloride 98 96 - 112 mEq/L   CO2 28 19 - 32 mEq/L   Glucose, Bld 91 70 - 99 mg/dL   BUN 3 (L) 6 - 23 mg/dL   Creatinine, Ser 3.76 0.40 - 1.20 mg/dL   Total Bilirubin 0.7 0.2 - 1.2 mg/dL   Alkaline Phosphatase 67 39 - 117 U/L   AST 20 0 - 37 U/L   ALT 12 0 - 35 U/L   Total Protein 7.1 6.0 - 8.3 g/dL   Albumin 4.2 3.5 - 5.2 g/dL   GFR 28.31 >51.76 mL/min    Comment: Calculated using the CKD-EPI Creatinine Equation (2021)   Calcium  9.2 8.4 - 10.5 mg/dL  TSH     Status: None   Collection Time: 11/14/23 10:59 AM  Result Value Ref Range   TSH 1.75 0.35 - 5.50 uIU/mL  CBC w/Diff     Status: Abnormal   Collection Time: 11/14/23 10:59 AM  Result Value Ref Range   WBC 4.5 4.0 - 10.5 K/uL   RBC 4.47 3.87 - 5.11 Mil/uL   Hemoglobin  14.2 12.0 - 15.0 g/dL   HCT 16.0 73.7 - 10.6 %   MCV 95.1 78.0 - 100.0 fl   MCHC 33.4 30.0 - 36.0 g/dL   RDW 26.9 (H) 48.5 - 46.2 %   Platelets 304.0 150.0 - 400.0 K/uL   Neutrophils Relative % 35.5 (L) 43.0 - 77.0 %   Lymphocytes Relative 40.3 12.0 - 46.0 %   Monocytes Relative 16.5 (H) 3.0 - 12.0 %   Eosinophils Relative 5.5 (H) 0.0 - 5.0 %   Basophils Relative 2.2 0.0 - 3.0 %   Neutro Abs 1.6 1.4 - 7.7 K/uL   Lymphs Abs 1.8 0.7 - 4.0 K/uL   Monocytes Absolute 0.7 0.1 - 1.0 K/uL   Eosinophils Absolute 0.2 0.0 - 0.7 K/uL   Basophils Absolute 0.1 0.0 - 0.1 K/uL  B12 and Folate Panel     Status: Abnormal   Collection Time: 11/14/23 10:59 AM  Result Value Ref Range   Vitamin B-12 520 211 - 911 pg/mL   Folate 5.0 (L) >5.9 ng/mL  Vitamin B1     Status: Abnormal   Collection Time: 11/14/23 10:59 AM  Result Value Ref Range   Vitamin B1 (Thiamine ) 6 (L) 8 - 30 nmol/L    Comment: (Note) Vitamin supplementation within 24 hours prior to blood  draw may affect the accuracy of the results. . This test was developed and its analytical performance  characteristics have been determined by [employer]. It has not been cleared or approved by FDA.  This assay has been validated pursuant to the CLIA  regulations and is used for clinical purposes. . [facility] [address] [place] [address] [phone] Debroah Fanning L. Amaryllis Junior, MD, PhD   Homocysteine     Status: None   Collection Time: 11/24/23 10:52 AM  Result Value Ref Range  Homocysteine 9.8 <10.4 umol/L    Comment: Homocysteine is increased by functional deficiency of  folate or vitamin B12. Testing for methylmalonic acid  differentiates between these deficiencies. Other causes  of increased homocysteine include renal failure, folate  antagonists such as methotrexate and phenytoin, and  exposure to nitrous oxide. Naoma Bacca, et al., Ann Intern Med. 1999;131(5):331-9.   IBC + Ferritin      Status: Abnormal   Collection Time: 11/24/23 10:52 AM  Result Value Ref Range   Iron 203 (H) 42 - 145 ug/dL   Transferrin 960.4 540.9 - 360.0 mg/dL   Saturation Ratios 81.1 (H) 20.0 - 50.0 %   Ferritin 160.4 10.0 - 291.0 ng/mL   TIBC 361.2 250.0 - 450.0 mcg/dL  Urinalysis w microscopic + reflex cultur     Status: Abnormal   Collection Time: 12/09/23 12:03 PM   Specimen: Urine  Result Value Ref Range   Color, Urine DARK YELLOW YELLOW   APPearance CLOUDY (A) CLEAR   Specific Gravity, Urine 1.023 1.001 - 1.035   pH 5.5 5.0 - 8.0   Glucose, UA NEGATIVE NEGATIVE   Bilirubin Urine NEGATIVE NEGATIVE   Ketones, ur 1+ (A) NEGATIVE   Hgb urine dipstick NEGATIVE NEGATIVE   Protein, ur 1+ (A) NEGATIVE   Nitrites, Initial NEGATIVE NEGATIVE   Leukocyte Esterase TRACE (A) NEGATIVE   WBC, UA 0-5 0 - 5 /HPF   RBC / HPF NONE SEEN 0 - 2 /HPF   Squamous Epithelial / HPF 10-20 (A) < OR = 5 /HPF   Bacteria, UA FEW (A) NONE SEEN /HPF   Hyaline Cast 0-5 (A) NONE SEEN /LPF   Note      Comment: This urine was analyzed for the presence of WBC,  RBC, bacteria, casts, and other formed elements.  Only those elements seen were reported. . .   Urine Culture     Status: Abnormal   Collection Time: 12/09/23 12:03 PM  Result Value Ref Range   MICRO NUMBER: [id number]    SPECIMEN QUALITY: Adequate    Sample Source URINE    STATUS: FINAL    ISOLATE 1: Escherichia coli (A)     Comment: 10,000-49,000 CFU/mL of Escherichia coli   ISOLATE 2: Escherichia coli (A)     Comment: 10,000-49,000 CFU/mL of Escherichia coli      Susceptibility   Escherichia coli - URINE CULTURE, REFLEX    AMOX/CLAVULANIC 8 Sensitive     AMPICILLIN/SULBACTAM 4 Sensitive     CEFAZOLIN* <=4 Not Reportable      * For infections other than uncomplicated UTI caused by E. coli, K. pneumoniae or P. mirabilis: Cefazolin is resistant if MIC > or = 8 mcg/mL. (Distinguishing susceptible versus intermediate for isolates with MIC < or = 4  mcg/mL requires additional testing.) For uncomplicated UTI caused by E. coli, K. pneumoniae or P. mirabilis: Cefazolin is susceptible if MIC <32 mcg/mL and predicts susceptible to the oral agents cefaclor, cefdinir, cefpodoxime, cefprozil, cefuroxime, cephalexin  and loracarbef.     CEFTAZIDIME <=1 Sensitive     CEFEPIME <=0.12 Sensitive     CEFTRIAXONE <=0.25 Sensitive     CIPROFLOXACIN 0.5 Intermediate     LEVOFLOXACIN 1 Intermediate     GENTAMICIN <=1 Sensitive     IMIPENEM <=0.25 Sensitive     MEROPENEM <=0.25 Sensitive     NITROFURANTOIN <=16 Sensitive     PIP/TAZO <=4 Sensitive     TRIMETH/SULFA <=20 Sensitive    Escherichia coli - URINE CULTURE NEGATIVE 2  AMOX/CLAVULANIC 8 Sensitive     AMPICILLIN/SULBACTAM 16 Intermediate     CEFAZOLIN <=4 Not Reportable     CEFTAZIDIME <=1 Sensitive     CEFEPIME <=0.12 Sensitive     CEFTRIAXONE <=0.25 Sensitive     CIPROFLOXACIN 0.5 Intermediate     LEVOFLOXACIN 1 Intermediate     GENTAMICIN <=1 Sensitive     IMIPENEM <=0.25 Sensitive     MEROPENEM <=0.25 Sensitive     NITROFURANTOIN <=16 Sensitive     PIP/TAZO <=4 Sensitive     TRIMETH/SULFA* >=320 Resistant      * For infections other than uncomplicated UTI caused by E. coli, K. pneumoniae or P. mirabilis: Cefazolin is resistant if MIC > or = 8 mcg/mL. (Distinguishing susceptible versus intermediate for isolates with MIC < or = 4 mcg/mL requires additional testing.) For uncomplicated UTI caused by E. coli, K. pneumoniae or P. mirabilis: Cefazolin is susceptible if MIC <32 mcg/mL and predicts susceptible to the oral agents cefaclor, cefdinir, cefpodoxime, cefprozil, cefuroxime, cephalexin  and loracarbef. Legend: S = Susceptible  I = Intermediate R = Resistant  NS = Not susceptible SDD = Susceptible Dose Dependent * = Not Tested  NR = Not Reported **NN = See Therapy Comments   REFLEXIVE URINE CULTURE     Status: None   Collection Time: 12/09/23 12:03 PM  Result Value  Ref Range   REFLEXIVE URINE CULTURE      Comment: CULTURE INDICATED - RESULTS TO FOLLOW  Hemochromatosis DNA, PCR     Status: None   Collection Time: 12/23/23 10:56 AM  Result Value Ref Range   DNA Mutation Analysis Comment     Comment: (NOTE) Results: c.845G>A (p.Cys282Tyr) - Not Detected c.187C>G (p.His63Asp) - Not Detected c.193A>T (p.Ser65Cys) - Detected, heterozygous Not associated with increased risk to develop clinical symptoms of Hereditary Hemochromatosis. In symptomatic individuals, other causes of iron overload should be evaluated. See Additional Information and Comments. Additional Clinical Information: Hereditary hemochromatosis (HFE related) is an autosomal recessive iron storage disorder. Patients may have a genetic diagnosis of hereditary hemochromatosis and never show clinical symptoms. Clinical symptoms typically appear between 40 to 60 years in males and after menopause in females. Signs and symptoms may include organ damage, primarily in the liver, risk for hepatocellular carcinoma, diabetes, and heart disease due to iron accumulation. Life expectancy may be decreased in individuals who develop cirrhosis. Treatment for clinically symptomatic individuals may include therapeutic ph lebotomy. Liver transplant may be used to treat end stage liver failure. For preventive care, monitoring for iron overload is recommended for patients who are homozygous for c.845G>A (p.Cys282Tyr) and have yet to experience clinical symptoms. Comments: The most common HFE variants associated with hereditary hemochromatosis are c.845G>A (p.Cys282Tyr), c.187C>G (p.His63Asp), c.193A>T (p.Ser65Cys). While patients homozygous for c.845G>A (p.Cys282Tyr) are the most likely to present clinical symptoms, less than 10% develop clinically significant iron overload with tissue and organ damage. Genetic counseling is recommended to discuss the potential clinical implications of positive results,  as well as recommendations for testing family members. Genetic Coordinators are available for health care providers to discuss results at [phone]). Test Details: Three variants analyzed: c.845G>A (p.Cys282Tyr), commonly referred to as C282Y c.187C>G (p.His63Asp), commonly re ferred to as H63D c.193A>T (p.Ser65Cys), commonly referred to as S65C Methods/Limitations: DNA Analysis of the HFE gene (NM_000410.4) was performed by PCR amplification followed by restriction enzyme digestion analyses. Results must be combined with clinical information for the most accurate interpretation. Molecular-based testing is highly accurate, but as in any laboratory  test, diagnostic errors may occur. False positive or false negative results may occur for reasons that include genetic variants, blood transfusions, bone marrow transplantation, somatic or tissue-specific mosaicism, mislabeled samples, or erroneous representation of family relationships. This test was developed and its performance characteristics determined by [facility]. It has not been cleared or approved by the Food and Drug Administration. References: [facility], [address], [place] Hugh [place], [place]; American Association for the Study of Liver Diseases. Diagnosis and management of hemochromat osis: 2011 practice guideline by the American Association for the Study of Liver Diseases. Hepatology. 2011 Jul;54(1):328-43. doi: 10.1002/hep.24330. PMID: 16109604; PMCID: [id number]. [address], Brissot P, Swinkels DW, Zoller H, Kamarainen O, Patton S, Alonso I, Morris M, Keeney S. EMQN best practice guidelines for the molecular genetic diagnosis of hereditary hemochromatosis [facility]). Eur J Hum Genet. 2016 Apr;24(4):479-95. doi: 10.1038/ejhg.2015.128. Epub 2015 Jul 8. PMID: 78295621; PMCID: HYQ6578469.    Reviewed by: Comment     Comment: (NOTE) Technical Component performed at [facility] Professional Component performed by: Melissa  A. Lillia Reilly, PhD, [facility] [id number], [facility], [address] [place] [address] Performed At: [facility] [address] [place], [place] [id number] Adams Adams MDPhD [id number]   Kappa/lambda light chains     Status: None   Collection Time: 12/23/23 10:56 AM  Result Value Ref Range   Kappa free light chain 18.7 3.3 - 19.4 mg/L   Lambda free light chains 24.2 5.7 - 26.3 mg/L   Kappa, lambda light chain ratio 0.77 0.26 - 1.65    Comment: (NOTE) Performed At: [facility] [place] [address] [place], [place] [id number] Pearlean Botts MD OV:[id number]   Multiple Myeloma Panel (SPEP&IFE w/QIG)     Status: None   Collection Time: 12/23/23 10:56 AM  Result Value Ref Range   IgG (Immunoglobin G), Serum 1,072 586 - 1,602 mg/dL   IgA 841 87 - 660 mg/dL   IgM (Immunoglobulin M), Srm 174 26 - 217 mg/dL   Total Protein ELP 6.5 6.0 - 8.5 g/dL   Albumin SerPl Elph-Mcnc 3.4 2.9 - 4.4 g/dL   Alpha 1 0.3 0.0 - 0.4 g/dL   Alpha2 Glob SerPl Elph-Mcnc 0.7 0.4 - 1.0 g/dL   B-Globulin SerPl Elph-Mcnc 0.9 0.7 - 1.3 g/dL   Gamma Glob SerPl Elph-Mcnc 1.2 0.4 - 1.8 g/dL   M Protein SerPl Elph-Mcnc Not Observed Not Observed g/dL   Globulin, Total 3.1 2.2 - 3.9 g/dL   Albumin/Glob SerPl 1.1 0.7 - 1.7   IFE 1 Comment     Comment: (NOTE) The immunofixation pattern appears unremarkable. Evidence of monoclonal protein is not apparent.    Please Note Comment     Comment: (NOTE) Protein electrophoresis scan will follow via computer, mail, or courier delivery. Performed At: [facility] [address] [place], [place] [id number] Pearlean Botts MD NA:[id number]   Uric acid     Status: None   Collection Time: 12/23/23 10:56 AM  Result Value Ref Range   Uric Acid, Serum 2.9 2.5 - 7.1 mg/dL    Comment: Performed at [facility], [address] [place], [place] [address]  Lactate dehydrogenase     Status: None   Collection Time: 12/23/23 10:56 AM  Result Value Ref Range    LDH 146 98 - 192 U/L    Comment: Performed at [facility], [address] [place], [place] [address]  Ferritin     Status: None   Collection Time: 12/23/23 10:56 AM  Result Value  Ref Range   Ferritin 199 11 - 307 ng/mL    Comment: Performed at [facility], [address], [place], [place] [address]  Iron and TIBC     Status: Abnormal   Collection Time: 12/23/23 10:56 AM  Result Value Ref Range   Iron 137 28 - 170 ug/dL   TIBC 604 540 - 981 ug/dL   Saturation Ratios 42 (H) 10.4 - 31.8 %   UIBC 189 ug/dL    Comment: Performed at [facility], [address] [place], [place] [address]  CMP (Cancer Center only)     Status: Abnormal   Collection Time: 12/23/23 10:56 AM  Result Value Ref Range   Sodium 136 135 - 145 mmol/L   Potassium 3.7 3.5 - 5.1 mmol/L   Chloride 98 98 - 111 mmol/L   CO2 23 22 - 32 mmol/L   Glucose, Bld 128 (H) 70 - 99 mg/dL    Comment: Glucose reference range applies only to samples taken after fasting for at least 8 hours.   BUN <5 (L) 6 - 20 mg/dL   Creatinine 8.29 5.62 - 1.00 mg/dL   Calcium  9.7 8.9 - 10.3 mg/dL   Total Protein 7.5 6.5 - 8.1 g/dL   Albumin 4.2 3.5 - 5.0 g/dL   AST 27 15 - 41 U/L   ALT 16 0 - 44 U/L   Alkaline Phosphatase 70 38 - 126 U/L   Total Bilirubin 0.3 0.0 - 1.2 mg/dL   GFR, Estimated >13 >08 mL/min    Comment: (NOTE) Calculated using the CKD-EPI Creatinine Equation (2021)    Anion gap 15 5 - 15    Comment: Performed at [facility], [address] [place], [place] [address]  CBC with Differential (Cancer Center Only)     Status: None   Collection Time: 12/23/23 10:56 AM  Result Value Ref Range   WBC Count 5.3 4.0 - 10.5 K/uL   RBC 4.45 3.87 - 5.11 MIL/uL   Hemoglobin 14.3 12.0 - 15.0 g/dL   HCT 69.6 29.5 - 28.4 %   MCV 95.3 80.0 - 100.0 fL   MCH 32.1 26.0 - 34.0 pg   MCHC 33.7 30.0 - 36.0 g/dL   RDW 13.2 44.0 - 10.2 %   Platelet Count 380 150 - 400 K/uL    nRBC 0.0 0.0 - 0.2 %   Neutrophils Relative % 60 %   Neutro Abs 3.2 1.7 - 7.7 K/uL   Lymphocytes Relative 24 %   Lymphs Abs 1.3 0.7 - 4.0 K/uL   Monocytes Relative 10 %   Monocytes Absolute 0.5 0.1 - 1.0 K/uL   Eosinophils Relative 4 %   Eosinophils Absolute 0.2 0.0 - 0.5 K/uL   Basophils Relative 2 %   Basophils Absolute 0.1 0.0 - 0.1 K/uL   Immature Granulocytes 0 %   Abs Immature Granulocytes 0.02 0.00 - 0.07 K/uL    Comment: Performed at [facility], [address] [place], [place] [address]     RADIOGRAPHIC STUDIES:  I have personally reviewed the radiological images as listed and agree with the findings in the report.  CT HEAD WO CONTRAST ( ) Result Date: 12/12/2023 CLINICAL DATA:  Syncope/presyncope.  Postural dizziness. EXAM: CT HEAD WITHOUT CONTRAST TECHNIQUE: Contiguous axial images were obtained from the base of the skull through the vertex without intravenous contrast. RADIATION DOSE REDUCTION: This exam was performed according to the departmental dose-optimization program which includes automated exposure control, adjustment of the  mA and/or kV according to patient size and/or use of iterative reconstruction technique. COMPARISON:  None Available. FINDINGS: Brain: No acute intracranial hemorrhage. No CT evidence of acute infarct. No edema, mass effect, or midline shift. The basilar cisterns are patent. Ventricles: The ventricles are normal. Vascular: No hyperdense vessel or unexpected calcification. Skull: No acute or aggressive finding. Orbits: Orbits are symmetric. Sinuses: Visualized paranasal sinuses are clear. Other: Visualized mastoid air cells are clear. IMPRESSION: No CT evidence of acute intracranial abnormality. Electronically Signed   By: Denny Flack M.D.   On: 12/12/2023 15:46     No orders of the defined types were placed in this encounter.    Future Appointments  Date Time Provider Department Center  01/22/2024 10:40 AM Mariel Shope, DO [facility]  03/26/2024  2:40 PM Sheryle Donning, MD [facility]  04/27/2024 10:15 AM [facility] PHLEBOTOMIST [facility] None  04/27/2024 10:45 AM Modelle Vollmer, Gale Jude, MD [facility] None    This document was completed utilizing speech recognition software. Grammatical errors, random word insertions, pronoun errors, and incomplete sentences are an occasional consequence of this system due to software limitations, ambient noise, and hardware issues. Any formal questions or concerns about the content, text or information contained within the body of this dictation should be directly addressed to the provider for clarification.

## 2024-01-06 NOTE — Assessment & Plan Note (Signed)
 Patient seems to have relapsed with her alcohol  use unfortunately.  Previously she was able to quit alcohol  but she is going through a bad break-up.  Per PCPs notes, she is attending AA meetings.  Encouraged to continue to limit alcohol  use.

## 2024-01-07 DIAGNOSIS — S52021A Displaced fracture of olecranon process without intraarticular extension of right ulna, initial encounter for closed fracture: Secondary | ICD-10-CM | POA: Diagnosis not present

## 2024-01-07 DIAGNOSIS — S62637A Displaced fracture of distal phalanx of left little finger, initial encounter for closed fracture: Secondary | ICD-10-CM | POA: Diagnosis not present

## 2024-01-09 DIAGNOSIS — S52021A Displaced fracture of olecranon process without intraarticular extension of right ulna, initial encounter for closed fracture: Secondary | ICD-10-CM | POA: Diagnosis not present

## 2024-01-12 ENCOUNTER — Telehealth: Payer: Self-pay

## 2024-01-12 DIAGNOSIS — S52031A Displaced fracture of olecranon process with intraarticular extension of right ulna, initial encounter for closed fracture: Secondary | ICD-10-CM | POA: Diagnosis not present

## 2024-01-12 DIAGNOSIS — S42401A Unspecified fracture of lower end of right humerus, initial encounter for closed fracture: Secondary | ICD-10-CM | POA: Diagnosis not present

## 2024-01-12 DIAGNOSIS — S52021A Displaced fracture of olecranon process without intraarticular extension of right ulna, initial encounter for closed fracture: Secondary | ICD-10-CM | POA: Diagnosis not present

## 2024-01-12 DIAGNOSIS — F1090 Alcohol use, unspecified, uncomplicated: Secondary | ICD-10-CM | POA: Diagnosis not present

## 2024-01-12 NOTE — Telephone Encounter (Signed)
 Copied from CRM [id number]. Topic: Clinical - Medical Advice >> Jan 12, 2024  9:22 AM Ernestene SQUIBB wrote: Reason for CRM: pt just want to know is the monitor on chest is heart monitor as she has a surgery procedure today, pt can be reached [phone]

## 2024-01-12 NOTE — Telephone Encounter (Deleted)
 Copied from CRM [id number]. Topic: General - Other >> Jan 12, 2024 10:51 AM Suzen RAMAN wrote: Reason for CRM: Patient sister called to inform provider that patient surgery(Elbow) for today has to be post pone. Patient sister would like a call back to further discuss and to provide some additional information pertaining to the patient. CB#[phone].

## 2024-01-12 NOTE — Telephone Encounter (Signed)
 LVM for pt to call office. Sister is not on DPR for this year, we can not give her any information regarding pt's health.

## 2024-01-12 NOTE — Telephone Encounter (Signed)
 Communication  Reason for CRM: Patient sister called to inform provider that patient surgery(Elbow) for today has to be post pone. Patient sister would like a call back to further discuss and to provide some additional information pertaining to the patient. CB#[phone].

## 2024-01-15 DIAGNOSIS — S82432A Displaced oblique fracture of shaft of left fibula, initial encounter for closed fracture: Secondary | ICD-10-CM | POA: Diagnosis not present

## 2024-01-15 DIAGNOSIS — S82832A Other fracture of upper and lower end of left fibula, initial encounter for closed fracture: Secondary | ICD-10-CM | POA: Diagnosis not present

## 2024-01-15 DIAGNOSIS — M79662 Pain in left lower leg: Secondary | ICD-10-CM | POA: Diagnosis not present

## 2024-01-15 DIAGNOSIS — W19XXXA Unspecified fall, initial encounter: Secondary | ICD-10-CM | POA: Diagnosis not present

## 2024-01-17 DIAGNOSIS — M25521 Pain in right elbow: Secondary | ICD-10-CM | POA: Diagnosis not present

## 2024-01-17 DIAGNOSIS — S52021D Displaced fracture of olecranon process without intraarticular extension of right ulna, subsequent encounter for closed fracture with routine healing: Secondary | ICD-10-CM | POA: Diagnosis not present

## 2024-01-19 ENCOUNTER — Telehealth: Payer: Self-pay

## 2024-01-19 DIAGNOSIS — S52021D Displaced fracture of olecranon process without intraarticular extension of right ulna, subsequent encounter for closed fracture with routine healing: Secondary | ICD-10-CM | POA: Diagnosis not present

## 2024-01-19 NOTE — Telephone Encounter (Signed)
 Noted

## 2024-01-19 NOTE — Telephone Encounter (Signed)
 Communication  Reason for CRM: Patient is requesting a phone call form provider to update her on the second surgery that she has to have on her elbow.   FYI--Called and spoke with pt. Pt requested to let provider know she has recently taken a hard fall causing a broken leg and elbow. She had surgery on her elbow last Monday and is now going to have a second procedure done on 7/3.

## 2024-01-20 ENCOUNTER — Encounter: Payer: Self-pay | Admitting: Family Medicine

## 2024-01-20 HISTORY — PX: OTHER SURGICAL HISTORY: SHX169

## 2024-01-21 DIAGNOSIS — M25521 Pain in right elbow: Secondary | ICD-10-CM | POA: Diagnosis not present

## 2024-01-21 DIAGNOSIS — S52021S Displaced fracture of olecranon process without intraarticular extension of right ulna, sequela: Secondary | ICD-10-CM | POA: Diagnosis not present

## 2024-01-21 DIAGNOSIS — F419 Anxiety disorder, unspecified: Secondary | ICD-10-CM | POA: Diagnosis not present

## 2024-01-21 DIAGNOSIS — Y907 Blood alcohol level of 200-239 mg/100 ml: Secondary | ICD-10-CM | POA: Diagnosis not present

## 2024-01-21 DIAGNOSIS — S52031K Displaced fracture of olecranon process with intraarticular extension of right ulna, subsequent encounter for closed fracture with nonunion: Secondary | ICD-10-CM | POA: Diagnosis not present

## 2024-01-21 DIAGNOSIS — S82831A Other fracture of upper and lower end of right fibula, initial encounter for closed fracture: Secondary | ICD-10-CM | POA: Diagnosis not present

## 2024-01-21 DIAGNOSIS — I10 Essential (primary) hypertension: Secondary | ICD-10-CM | POA: Diagnosis not present

## 2024-01-21 DIAGNOSIS — F17210 Nicotine dependence, cigarettes, uncomplicated: Secondary | ICD-10-CM | POA: Diagnosis not present

## 2024-01-21 DIAGNOSIS — S82832A Other fracture of upper and lower end of left fibula, initial encounter for closed fracture: Secondary | ICD-10-CM | POA: Diagnosis not present

## 2024-01-21 DIAGNOSIS — F331 Major depressive disorder, recurrent, moderate: Secondary | ICD-10-CM | POA: Diagnosis not present

## 2024-01-21 DIAGNOSIS — G250 Essential tremor: Secondary | ICD-10-CM | POA: Diagnosis not present

## 2024-01-21 DIAGNOSIS — F1010 Alcohol abuse, uncomplicated: Secondary | ICD-10-CM | POA: Diagnosis not present

## 2024-01-21 DIAGNOSIS — S42401A Unspecified fracture of lower end of right humerus, initial encounter for closed fracture: Secondary | ICD-10-CM | POA: Diagnosis not present

## 2024-01-21 DIAGNOSIS — S52021K Displaced fracture of olecranon process without intraarticular extension of right ulna, subsequent encounter for closed fracture with nonunion: Secondary | ICD-10-CM | POA: Diagnosis not present

## 2024-01-21 DIAGNOSIS — F1020 Alcohol dependence, uncomplicated: Secondary | ICD-10-CM | POA: Diagnosis not present

## 2024-01-21 DIAGNOSIS — R296 Repeated falls: Secondary | ICD-10-CM | POA: Diagnosis not present

## 2024-01-21 DIAGNOSIS — S52021M Displaced fracture of olecranon process without intraarticular extension of right ulna, subsequent encounter for open fracture type I or II with nonunion: Secondary | ICD-10-CM | POA: Diagnosis not present

## 2024-01-21 DIAGNOSIS — S82202A Unspecified fracture of shaft of left tibia, initial encounter for closed fracture: Secondary | ICD-10-CM | POA: Diagnosis not present

## 2024-01-21 DIAGNOSIS — Z9181 History of falling: Secondary | ICD-10-CM | POA: Diagnosis not present

## 2024-01-21 DIAGNOSIS — Z7982 Long term (current) use of aspirin: Secondary | ICD-10-CM | POA: Diagnosis not present

## 2024-01-21 DIAGNOSIS — F909 Attention-deficit hyperactivity disorder, unspecified type: Secondary | ICD-10-CM | POA: Diagnosis not present

## 2024-01-21 DIAGNOSIS — W19XXXA Unspecified fall, initial encounter: Secondary | ICD-10-CM | POA: Diagnosis not present

## 2024-01-21 DIAGNOSIS — F338 Other recurrent depressive disorders: Secondary | ICD-10-CM | POA: Diagnosis not present

## 2024-01-21 DIAGNOSIS — S52021A Displaced fracture of olecranon process without intraarticular extension of right ulna, initial encounter for closed fracture: Secondary | ICD-10-CM | POA: Diagnosis not present

## 2024-01-22 ENCOUNTER — Encounter: Payer: Self-pay | Admitting: Family Medicine

## 2024-01-22 ENCOUNTER — Ambulatory Visit: Admitting: Family Medicine

## 2024-01-22 DIAGNOSIS — S52023A Displaced fracture of olecranon process without intraarticular extension of unspecified ulna, initial encounter for closed fracture: Secondary | ICD-10-CM | POA: Insufficient documentation

## 2024-01-29 DIAGNOSIS — Z9181 History of falling: Secondary | ICD-10-CM | POA: Diagnosis not present

## 2024-01-29 DIAGNOSIS — F419 Anxiety disorder, unspecified: Secondary | ICD-10-CM | POA: Diagnosis not present

## 2024-01-29 DIAGNOSIS — I10 Essential (primary) hypertension: Secondary | ICD-10-CM | POA: Diagnosis not present

## 2024-01-29 DIAGNOSIS — S82432D Displaced oblique fracture of shaft of left fibula, subsequent encounter for closed fracture with routine healing: Secondary | ICD-10-CM | POA: Diagnosis not present

## 2024-01-29 DIAGNOSIS — S52001D Unspecified fracture of upper end of right ulna, subsequent encounter for closed fracture with routine healing: Secondary | ICD-10-CM | POA: Diagnosis not present

## 2024-01-29 DIAGNOSIS — R2689 Other abnormalities of gait and mobility: Secondary | ICD-10-CM | POA: Diagnosis not present

## 2024-01-29 DIAGNOSIS — S82202D Unspecified fracture of shaft of left tibia, subsequent encounter for closed fracture with routine healing: Secondary | ICD-10-CM | POA: Diagnosis not present

## 2024-01-29 DIAGNOSIS — F1020 Alcohol dependence, uncomplicated: Secondary | ICD-10-CM | POA: Diagnosis not present

## 2024-01-29 DIAGNOSIS — R600 Localized edema: Secondary | ICD-10-CM | POA: Diagnosis not present

## 2024-01-29 DIAGNOSIS — F17210 Nicotine dependence, cigarettes, uncomplicated: Secondary | ICD-10-CM | POA: Diagnosis not present

## 2024-01-29 DIAGNOSIS — W19XXXD Unspecified fall, subsequent encounter: Secondary | ICD-10-CM | POA: Diagnosis not present

## 2024-01-29 DIAGNOSIS — Z9889 Other specified postprocedural states: Secondary | ICD-10-CM | POA: Diagnosis not present

## 2024-01-29 DIAGNOSIS — G629 Polyneuropathy, unspecified: Secondary | ICD-10-CM | POA: Diagnosis not present

## 2024-01-29 DIAGNOSIS — S82402A Unspecified fracture of shaft of left fibula, initial encounter for closed fracture: Secondary | ICD-10-CM | POA: Diagnosis not present

## 2024-01-29 DIAGNOSIS — F988 Other specified behavioral and emotional disorders with onset usually occurring in childhood and adolescence: Secondary | ICD-10-CM | POA: Diagnosis not present

## 2024-01-29 DIAGNOSIS — S52021A Displaced fracture of olecranon process without intraarticular extension of right ulna, initial encounter for closed fracture: Secondary | ICD-10-CM | POA: Diagnosis not present

## 2024-01-29 DIAGNOSIS — S52031D Displaced fracture of olecranon process with intraarticular extension of right ulna, subsequent encounter for closed fracture with routine healing: Secondary | ICD-10-CM | POA: Diagnosis not present

## 2024-01-29 DIAGNOSIS — S52021S Displaced fracture of olecranon process without intraarticular extension of right ulna, sequela: Secondary | ICD-10-CM | POA: Diagnosis not present

## 2024-01-29 DIAGNOSIS — S42401D Unspecified fracture of lower end of right humerus, subsequent encounter for fracture with routine healing: Secondary | ICD-10-CM | POA: Diagnosis not present

## 2024-01-29 DIAGNOSIS — Z736 Limitation of activities due to disability: Secondary | ICD-10-CM | POA: Diagnosis not present

## 2024-02-03 ENCOUNTER — Encounter: Payer: Self-pay | Admitting: Sports Medicine

## 2024-02-03 NOTE — Care Plan (Addendum)
  Problem: Bowel/Gastric Goal: Establishment of normal bowel function will improve Outcome: Progressing   Problem: Health Behavior Goal: Knowledge of safety precautions for fall risks will improve Outcome: Progressing Goal: Instruct on pain control measures Outcome: Progressing   Problem: Safety Goal: Ability to remain free from injury will improve Outcome: Progressing   Problem: Sensory Goal: Satisfaction with pain management regimen will improve Outcome: Progressing   Problem: Skin Integrity Goal: Skin integrity will remain intact Outcome: Progressing   Problem: Urinary Elimination Goal: Ability to maintain continence will be supported Outcome: Progressing   Problem: Safety: Goal: Will remain free from falls by discharge Description: Will remain free from falls by discharge Outcome: Progressing   Problem: Bowel/Gastric Goal: Establishment of normal bowel function will improve Outcome: Progressing   Problem: Cardiac Goal: Ability to maintain signs within normal range will stabilize Outcome: Progressing   Problem: Skin Integrity Goal: Skin integrity will improve Outcome: Progressing   Problem: Urinary Elimination Goal: Ability to reestablish a normal urinary elimination pattern will improve Outcome: Progressing  Alert and oriented. Forgetful. Impulsive at times, not allowing staff to apply gait belt or put on slip resistant socks before walking to the bathroom. Pt requested to go outside to the parking deck for a while. Anxious and tearful this, yelling at staff for holding her prisoner. PRN Oxycodone given for pain, atarax  given for anxiety. Pt requested ibuprofen  for pain. Reporting the oxycodone does not help. Hospitalist ordered ibuprofen  400mg  po daily. Hard cast in place to right arm. Ambulates with gait belt and one assist.  Pt reports ibuprofen  worked better than oxycodone. Pt would like oxycodone discontinued and have it replaced with ibuprofen  if possible.

## 2024-02-03 NOTE — Group Note (Signed)
 Leah Jones completed RT Eval on 01/30/2024 and is A&Ox4 and pleasant. Her personal goal is be able to take of herself, get stronger, get her balance, get her head together and find out what she wants to do when she grows up. Her previous leisure and recreation interests are reading, watching TV, going to church, going for walks, listening to 70's and 80's music and spending time with family and friends. To return to her previous leisure interests, [place] will work on her static and dynamic standing balance, L UE strengthening as well as functional physical tasks during Recreation Therapy sessions while on IRC. Her goals are set at supervision. She has participated in one RT session where she worked on her L UE strength using a 2 lb cuff weight and is independent for L UE strength and problem solving. Standing has not been practiced.

## 2024-02-04 ENCOUNTER — Telehealth (HOSPITAL_COMMUNITY): Payer: Self-pay

## 2024-02-04 NOTE — Telephone Encounter (Signed)
 Leah Jones calls from [facility] saying she is in the rehab unit as she fell down the stairs and broke several bones. Notes are in Epic.  She says she was put on Oxycodone for post surgical pain but asked to be taken off and given ibuprofen  yesterday.  She says she has been prescribed Naltrexone . Shardai says she has a problem with alcohol  and wants to be in the intensive outpatient group.  Jackline says she has [facility]. She is not in need of detox as [facility] notes in Epic that she is out of the detox phase on note dated 01-21-24. [facility] has diagnosed her with Alcohol  Use Disorder, severe, dependence.

## 2024-02-05 ENCOUNTER — Telehealth (HOSPITAL_COMMUNITY): Payer: Self-pay

## 2024-02-05 NOTE — Telephone Encounter (Signed)
 The phone call this therapist was on with Leah Jones got disconnected, so therapist calls back.  Leah Jones says she is not sure what happened. Leah Jones says she is wanting to get in the [facility] substance use IOP.  Therapist asks when she thinks she will be discharged.  Leah Jones says she feels certain she will be discharged this Friday or the weekend. This therapist offers her an appointment for Monday, 7-21,, 2025 at 1pm. Leah Jones accepts. Therapist explains where the location of CD-IOP is held and informs Leah Jones to come to the 2nd floor to check in.  [name], MS, LMFT, LCAS

## 2024-02-09 ENCOUNTER — Ambulatory Visit (INDEPENDENT_AMBULATORY_CARE_PROVIDER_SITE_OTHER)

## 2024-02-09 ENCOUNTER — Encounter (HOSPITAL_COMMUNITY): Payer: Self-pay

## 2024-02-09 DIAGNOSIS — F1994 Other psychoactive substance use, unspecified with psychoactive substance-induced mood disorder: Secondary | ICD-10-CM

## 2024-02-09 DIAGNOSIS — F1020 Alcohol dependence, uncomplicated: Secondary | ICD-10-CM

## 2024-02-09 NOTE — Progress Notes (Unsigned)
 SABRA

## 2024-02-10 ENCOUNTER — Encounter: Payer: Self-pay | Admitting: Family Medicine

## 2024-02-10 ENCOUNTER — Ambulatory Visit (INDEPENDENT_AMBULATORY_CARE_PROVIDER_SITE_OTHER): Admitting: Family Medicine

## 2024-02-10 VITALS — BP 128/80 | HR 86 | Temp 98.2°F | Wt 129.0 lb

## 2024-02-10 DIAGNOSIS — S82402D Unspecified fracture of shaft of left fibula, subsequent encounter for closed fracture with routine healing: Secondary | ICD-10-CM | POA: Diagnosis not present

## 2024-02-10 DIAGNOSIS — S82202D Unspecified fracture of shaft of left tibia, subsequent encounter for closed fracture with routine healing: Secondary | ICD-10-CM

## 2024-02-10 DIAGNOSIS — S82202A Unspecified fracture of shaft of left tibia, initial encounter for closed fracture: Secondary | ICD-10-CM | POA: Insufficient documentation

## 2024-02-10 DIAGNOSIS — S52023D Displaced fracture of olecranon process without intraarticular extension of unspecified ulna, subsequent encounter for closed fracture with routine healing: Secondary | ICD-10-CM

## 2024-02-10 DIAGNOSIS — E519 Thiamine deficiency, unspecified: Secondary | ICD-10-CM

## 2024-02-10 DIAGNOSIS — E538 Deficiency of other specified B group vitamins: Secondary | ICD-10-CM | POA: Diagnosis not present

## 2024-02-10 DIAGNOSIS — F10288 Alcohol dependence with other alcohol-induced disorder: Secondary | ICD-10-CM | POA: Diagnosis not present

## 2024-02-10 LAB — VITAMIN D 25 HYDROXY (VIT D DEFICIENCY, FRACTURES): VITD: 20.5 ng/mL — ABNORMAL LOW (ref 30.00–100.00)

## 2024-02-10 LAB — B12 AND FOLATE PANEL
Folate: 17.8 ng/mL (ref >5.9)
Vitamin B-12: 590 pg/mL (ref 211–911)

## 2024-02-10 LAB — MAGNESIUM: Magnesium: 1.8 mg/dL (ref 1.5–2.5)

## 2024-02-10 NOTE — Progress Notes (Unsigned)
 Leah Jones , 06-18-1961, 63 y.o., female MRN: [id number] Patient Care Team    Relationship Specialty Notifications Start End  Catherine Charlies LABOR, DO PCP - General Family Medicine  06/19/15   Obgyn, Anna    03/05/22     Chief Complaint  Patient presents with   Hospitalization Follow-up     Subjective:  Leah Jones  is a 63 y.o. female presents for hospital follow up after recent admission on 01/29/2024 for primary diagnosis Unspecified fracture of shaft of left fibula, initial encounter for closed fracture [S82.402A] Etiologic Diagnosis Code/Description: S82.402A unspecifed fracture of L fibular shaft; D17.167J other fx of lower end L fibula; S82.202A unspecified fracture L tibial shaft . Pt. was discharged on 02/06/2024 to home.  Patients discharge summary has been reviewed, as well as all labs/image studies obtained during hospitalization.  Medication reconciliation completed today.  Patients hospital course: *** Since hospital discharge patient reports [facility] Course:   For full details, please see H&P, progress notes, consult notes and ancillary notes. Briefly, Leah Jones is a 63 y.o. year old female with a PMH of hypertension, peripheral neuropathy, alcohol  use disorder that presented to [facility] 01/15/2024 due to concern for alcohol  withdrawal and falls. Patient has a history of recurrent falls most recent on her last admission where she suffered a right olecranon fracture, underwent ORIF of right olecranon fracture 06/23. Patient suffered a fall after this admission and on ED presentation 01/21/2024 displacement of her olecranon fragment. She underwent olecranon revision with ORIF and cast application. Patient also noted to have acute left tib-fib fracture which were managed nonoperatively. PT and OT patient and are recommending IRC. On 01/29/24 patient c/o increasing left elbow pain throughout the night concerned her elbow may have separated again.  Xray of right elbow showed no acute findings. She is hemodynamically stable and ready for discharge to [facility]   The patient's hospital course will be summarized in a problem based approach below.   Closed olecranon fracture, right, sequela Patient had a fall in mid June, fracture right olecranon, status post right olecranon ORIF on 6/23, had couple of falls since surgery, repeat x-ray after fall now revealing displacement of right olecranon fragment. --Ortho signed off  --no asa DVT prophylaxis needed , patient is ambulatory  --Nonweightbearing to the right upper extremity --Repeat Xray of right elbow due to worsening pain showed no acute findings  -PT/OT recommending IRC  --ortho to follow patient in [facility] and arrange follow up   Alcohol  use disorder, severe, dependence (CMD) --patient previously consumed 4-5 beers, 5-6 white claws and half a bottle of wine daily  --currently out of the detox phase --Ativan  taper completed  --continue vitamins   Unspecified anxiety disorder --prn hydroxyzine    Closed fracture of left tibia and fibula --ortho has signed off, site is non operable  --Weight bearing as tolerated to LLE   Hypomagnesemia --continue mag supplement three times daily  RIGHT OLECRANON REVISION OPEN REDUCTION INTERNAL FIXATION, CASE APPLICATION   No results for input(s): HGB, HCT, WBC, PLT in the last 168 hours.    Latest Ref Rng & Units 12/23/2023   10:56 AM 11/14/2023   10:59 AM 12/11/2022    3:02 PM  CMP  Glucose 70 - 99 mg/dL 871  91  894   BUN 6 - 20 mg/dL 5  3  8    Creatinine 0.44 - 1.00 mg/dL 9.24  9.43  9.27   Sodium 135 - 145 mmol/L  136  137  132   Potassium 3.5 - 5.1 mmol/L 3.7  4.4  3.9   Chloride 98 - 111 mmol/L 98  98  100   CO2 22 - 32 mmol/L 23  28  22    Calcium  8.9 - 10.3 mg/dL 9.7  9.2  9.6   Total Protein 6.5 - 8.1 g/dL 7.5  7.1  7.1   Total Bilirubin 0.0 - 1.2 mg/dL 0.3  0.7  0.9   Alkaline Phos 38 - 126 U/L 70  67  52   AST 15 - 41 U/L 27   20  32   ALT 0 - 44 U/L 16  12  23        No results found.      11/14/2023   10:50 AM 12/10/2022    2:09 PM 11/20/2022    2:15 PM 03/05/2022    9:03 AM 12/12/2020    3:08 PM  Depression screen PHQ 2/9  Decreased Interest 1  0 0 0  Down, Depressed, Hopeless 1 0 0 0 0  PHQ - 2 Score 2 0 0 0 0  Altered sleeping 2 0 3    Tired, decreased energy 2 0 2    Change in appetite 3 0 0    Feeling bad or failure about yourself  1 0 0    Trouble concentrating 2 0 3    Moving slowly or fidgety/restless 0 0 2    Suicidal thoughts 0 0 0    PHQ-9 Score 12 0 10    Difficult doing work/chores Not difficult at all Not difficult at all Somewhat difficult      Allergies  Allergen Reactions   Bactrim [Sulfamethoxazole-Trimethoprim ] Anaphylaxis   Sulfa Antibiotics Anaphylaxis   Lisinopril  Other (See Comments)    ? angioedema   Seroquel  [Quetiapine  Fumarate] Other (See Comments)    Increased sedation on low dose and leg weakness   Social History   Tobacco Use   Smoking status: Some Days    Current packs/day: 0.50    Types: Cigarettes   Smokeless tobacco: Never  Substance Use Topics   Alcohol  use: Yes    Comment: Drinking 6 days a week. Drinks a bottle of wine, beer, or liquor    Past Medical History:  Diagnosis Date   ADD (attention deficit disorder)    ? alcohol  related   Alcohol  dependence, daily use (HCC) 11/17/2018   Alcoholism (HCC)    per record   Allergic reaction 09/24/2022   ANAL FISSURE, HX OF 04/15/2007   Annotation: occasional rectal bleeding Qualifier: Diagnosis of  By: Amon MD, Aloysius BRAVO.    Angio-edema    B12 deficiency    Carpal tunnel syndrome    Carpal tunnel syndrome 12/01/2009   Qualifier: Diagnosis of  By: Amon MD, Jose E.    COVID-19 virus infection 08/23/2019   Diarrhea 11/17/2018   Discoloration of skin of foot 05/20/2014   Purplish discoloration top of L foot    Eczema    Eustachian tube dysfunction 01/25/2014   Mallet deformity of fifth finger, left,  acquired 06/19/2015   Suicidal ideation    Urticaria    Past Surgical History:  Procedure Laterality Date   COLPOSCOPY  2020   pt reported benign.    FOOT SURGERY Left    Morton's neuroma, Dr Magdalen   PROBING OF NASOLACRIMAL DUCT, SILICONE TUBE, WITH OR WITHOUT IRRIGATION; WITH INSERTION OF TUBE OR STENT  09/24/2023   RHINOPLASTY     TONSILLECTOMY  Family History  Problem Relation Age of Onset   Stomach cancer Mother    Colon polyps Mother    Asthma Mother    Colon cancer Mother    Breast cancer Mother    Celiac disease Mother    Colon polyps Father    Alcoholism Father    Colon polyps Sister    Asthma Sister    Anxiety disorder Sister    ADD / ADHD Sister    Colon polyps Brother    Bipolar disorder Brother    Pancreatic cancer Brother    Lung cancer Paternal Grandmother    Pancreatic cancer Paternal Grandfather    Asthma Daughter    Asthma Son    Eczema Son    Bipolar disorder Son    Diabetes Other    Hypertension Other    Esophageal cancer Neg Hx    Rectal cancer Neg Hx    Allergies as of 02/10/2024       Reactions   Bactrim [sulfamethoxazole-trimethoprim ] Anaphylaxis   Sulfa Antibiotics Anaphylaxis   Lisinopril  Other (See Comments)   ? angioedema   Seroquel  [quetiapine  Fumarate] Other (See Comments)   Increased sedation on low dose and leg weakness        Medication List        Accurate as of February 10, 2024 11:50 AM. If you have any questions, ask your nurse or doctor.          amLODipine  10 MG tablet Commonly known as: NORVASC  Take 1 tablet (10 mg total) by mouth daily.   atomoxetine  40 MG capsule Commonly known as: STRATTERA  Take 40 mg by mouth every morning.   b complex vitamins capsule Take by mouth.   cetirizine  10 MG tablet Commonly known as: ZYRTEC  Take 1 tablet (10 mg total) by mouth at bedtime.   DULoxetine  60 MG capsule Commonly known as: Cymbalta  Take 1 capsule (60 mg total) by mouth daily.   EPINEPHrine  0.3 mg/0.3 mL  Soaj injection Commonly known as: EPI-PEN Inject 0.3 mg into the muscle as needed for anaphylaxis.   estradiol  1 MG tablet Commonly known as: ESTRACE  Take 1 mg by mouth daily.   fluticasone  50 MCG/ACT nasal spray Commonly known as: FLONASE  1 spray daily.   folic acid  1 MG tablet Commonly known as: FOLVITE  Take 1 tablet (1 mg total) by mouth daily.   gabapentin  300 MG capsule Commonly known as: Neurontin  Take 1 capsule (300 mg total) by mouth 3 (three) times daily.   guanFACINE  1 MG tablet Commonly known as: TENEX  Take 1 tablet (1 mg total) by mouth at bedtime.   medroxyPROGESTERone 2.5 MG tablet Commonly known as: PROVERA Take 2.5 mg by mouth daily.   Melatonin 3 MG Subl Take 1 tablet by mouth at bedtime as needed.   naltrexone  50 MG tablet Commonly known as: DEPADE Take 2 tablets (100 mg total) by mouth at bedtime.   propranolol  ER 120 MG 24 hr capsule Commonly known as: INDERAL  LA Take 1 capsule (120 mg total) by mouth daily.        All past medical history, surgical history, allergies, family history, immunizations and medications were updated in the EMR today and reviewed under the history and medication portions of their EMR.      ROS: Negative, with the exception of above mentioned in HPI   Objective:  BP 134/84   Pulse 86   Temp 98.2 F (36.8 C)   Wt 129 lb (58.5 kg)   LMP 04/18/2019 (Approximate)  SpO2 96%   BMI 20.82 kg/m  Body mass index is 20.82 kg/m. Physical Exam Vitals and nursing note reviewed.  Constitutional:      General: She is not in acute distress.    Appearance: Normal appearance. She is not ill-appearing, toxic-appearing or diaphoretic.  HENT:     Head: Normocephalic and atraumatic.  Eyes:     General: No scleral icterus.       Right eye: No discharge.        Left eye: No discharge.     Extraocular Movements: Extraocular movements intact.     Conjunctiva/sclera: Conjunctivae normal.     Pupils: Pupils are equal, round,  and reactive to light.  Cardiovascular:     Rate and Rhythm: Normal rate and regular rhythm.  Pulmonary:     Effort: Pulmonary effort is normal. No respiratory distress.     Breath sounds: Normal breath sounds. No wheezing, rhonchi or rales.  Musculoskeletal:     Right lower leg: No edema.     Left lower leg: No edema.     Comments: ***  Skin:    General: Skin is warm.     Findings: No rash.  Neurological:     Mental Status: She is alert and oriented to person, place, and time. Mental status is at baseline.     Motor: No weakness.     Gait: Gait normal.  Psychiatric:        Mood and Affect: Mood normal.        Behavior: Behavior normal.        Thought Content: Thought content normal.        Judgment: Judgment normal.    Had zio- has not sent back yet and she reports that is whte time she was the most dizzy- asked her to return.  Assessment/Plan: Michaelia Beilfuss is a 63 y.o. female present for OV for Hospital discharge follow up *** Reviewed expectations re: course of current medical issues. Discussed self-management of symptoms. Outlined signs and symptoms indicating need for more acute intervention. Patient verbalized understanding and all questions were answered. Patient received an After-Visit Summary. Any changes in medications were reviewed and patient was provided with updated med list with their AVS.     No orders of the defined types were placed in this encounter.    Note is dictated utilizing voice recognition software. Although note has been proof read prior to signing, occasional typographical errors still can be missed. If any questions arise, please do not hesitate to call for verification.   electronically signed by:  Charlies Bellini, DO  [facility] - [place]

## 2024-02-10 NOTE — Patient Instructions (Addendum)
 Return in about 3 months (around 05/10/2024) for Routine chronic condition follow-up.        Great to see you today.  I have refilled the medication(s) we provide.   If labs were collected or images ordered, we will inform you of  results once we have received them and reviewed. We will contact you either by echart message, or telephone call.  Please give ample time to the testing facility, and our office to run,  receive and review results. Please do not call inquiring of results, even if you can see them in your chart. We will contact you as soon as we are able. If it has been over 1 week since the test was completed, and you have not yet heard from us , then please call us .    - echart message- for normal results that have been seen by the patient already.   - telephone call: abnormal results or if patient has not viewed results in their echart.  If a referral to a specialist was entered for you, please call us  in 2 weeks if you have not heard from the specialist office to schedule.

## 2024-02-10 NOTE — Progress Notes (Addendum)
 Comprehensive Clinical Assessment (CCA) Note  02/10/2024 Leah Jones [id number]  Chief Complaint:  Chief Complaint  Patient presents with   Addiction Problem   Visit Diagnosis: Alcohol  Use Disorder, Servere, Substance Induced Mood Disorder   CCA Screening, Triage and Referral (STR)  Patient Reported Information How did you hear about us ? No data recorded Referral name: No data recorded Referral phone number: No data recorded  Whom do you see for routine medical problems? No data recorded Practice/Facility Name: No data recorded Practice/Facility Phone Number: No data recorded Name of Contact: No data recorded Contact Number: No data recorded Contact Fax Number: No data recorded Prescriber Name: No data recorded Prescriber Address (if known): No data recorded  What Is the Reason for Your Visit/Call Today? No data recorded How Long Has This Been Causing You Problems? No data recorded What Do You Feel Would Help You the Most Today? No data recorded  Have You Recently Been in Any Inpatient Treatment (Hospital/Detox/Crisis Center/28-Day Program)? No data recorded Name/Location of Program/Hospital:No data recorded How Long Were You There? No data recorded When Were You Discharged? No data recorded  Have You Ever Received Services From [facility] Before? No data recorded Who Do You See at [facility]? No data recorded  Have You Recently Had Any Thoughts About Hurting Yourself? No data recorded Are You Planning to Commit Suicide/Harm Yourself At This time? No data recorded  Have you Recently Had Thoughts About Hurting Someone [name]? No data recorded Explanation: No data recorded  Have You Used Any Alcohol  or Drugs in the Past 24 Hours? No data recorded How Long Ago Did You Use Drugs or Alcohol ? No data recorded What Did You Use and How Much? No data recorded  Do You Currently Have a Therapist/Psychiatrist? No data recorded Name of Therapist/Psychiatrist: No data  recorded  Have You Been Recently Discharged From Any Office Practice or Programs? No data recorded Explanation of Discharge From Practice/Program: No data recorded    CCA Screening Triage Referral Assessment Type of Contact: No data recorded Is this Initial or Reassessment? No data recorded Date Telepsych consult ordered in [facility]:  No data recorded Time Telepsych consult ordered in [facility]:  No data recorded  Patient Reported Information Reviewed? No data recorded Patient Left Without Being Seen? No data recorded Reason for Not Completing Assessment: No data recorded  Collateral Involvement: No data recorded  Does Patient Have a Court Appointed Legal Guardian? No data recorded Name and Contact of Legal Guardian: No data recorded If Minor and Not Living with Parent(s), Who has Custody? No data recorded Is CPS involved or ever been involved? No data recorded Is APS involved or ever been involved? No data recorded  Patient Determined To Be At Risk for Harm To Self or Others Based on Review of Patient Reported Information or Presenting Complaint? No data recorded Method: No data recorded Availability of Means: No data recorded Intent: No data recorded Notification Required: No data recorded Additional Information for Danger to Others Potential: No data recorded Additional Comments for Danger to Others Potential: No data recorded Are There Guns or Other Weapons in Your Home? No data recorded Types of Guns/Weapons: No data recorded Are These Weapons Safely Secured?                            No data recorded Who Could Verify You Are Able To Have These Secured: No data recorded Do You Have any Outstanding Charges, Pending Court  Dates, Parole/Probation? No data recorded Contacted To Inform of Risk of Harm To Self or Others: No data recorded  Location of Assessment: No data recorded  Does Patient Present under Involuntary Commitment? No data recorded IVC Papers Initial File Date: No data  recorded  [place] of Residence: No data recorded  Patient Currently Receiving the Following Services: No data recorded  Determination of Need: No data recorded  Options For Referral: No data recorded    CCA Biopsychosocial Intake/Chief Complaint:  Alcohol  Use  Current Symptoms/Problems: alcohol  use, depression, anxiety   Patient Reported Schizophrenia/Schizoaffective Diagnosis in Past: No   Strengths: good at building relationships I see the good in everybody  Preferences: CDIOP  Abilities: loving people   Type of Services Patient Feels are Needed: Substance abuse treatment   Initial Clinical Notes/Concerns: No data recorded  Mental Health Symptoms Depression:  -- (PA-Q-9 is 14)   Duration of Depressive symptoms: No data recorded  Mania:  None   Anxiety:   -- (GAD-7 is 8.)   Psychosis:  None   Duration of Psychotic symptoms: No data recorded  Trauma:  N/A   Obsessions:  None   Compulsions:  None   Inattention:  None   Hyperactivity/Impulsivity:  No data recorded  Oppositional/Defiant Behaviors:  None   Emotional Irregularity:  None   Other Mood/Personality Symptoms:  No data recorded   Mental Status Exam Appearance and self-care  Stature:  Average   Weight:  Average weight   Clothing:  Casual   Grooming:  Normal   Cosmetic use:  Age appropriate   Posture/gait:  Normal   Motor activity:  No data recorded  Sensorium  Attention:  Distractible   Concentration:  Variable   Orientation:  X5   Recall/memory:  Normal   Affect and Mood  Affect:  Appropriate   Mood:  Depressed   Relating  Eye contact:  Normal   Facial expression:  Responsive   Attitude toward examiner:  Cooperative   Thought and Language  Speech flow: Normal   Thought content:  No data recorded  Preoccupation:  None   Hallucinations:  None   Organization:  No data recorded  [employer] of Knowledge:  Average   Intelligence:  No data  recorded  Abstraction:  Abstract   Judgement:  Fair   Reality Testing:  Realistic   Insight:  Fair   Decision Making:  Normal   Social Functioning  Social Maturity:  Responsible   Social Judgement:  Normal   Stress  Stressors:  Family conflict; Grief/losses; Housing; Illness   Coping Ability:  Overwhelmed   Skill Deficits:  No data recorded  Supports:  Support needed     Religion: Religion/Spirituality Are You A Religious Person?: Yes What is Your Religious Affiliation?: Christian  Leisure/Recreation:    Exercise/Diet: Exercise/Diet Do You Exercise?: No Have You Gained or Lost A Significant Amount of Weight in the Past Six Months?: No Do You Follow a Special Diet?: No Do You Have Any Trouble Sleeping?: Yes Explanation of Sleeping Difficulties: difficulty sustaining sleep   CCA Employment/Education Employment/Work Situation: Employment / Work Situation Employment Situation: Unemployed Patient's Job has Been Impacted by Current Illness: Yes Describe how Patient's Job has Been Impacted: planning kife around drinking What is the Longest Time Patient has Held a Job?: 10 years 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?: No  Education: Education Last Grade Completed: 13 Did Garment/textile technologist From [employer]  School?: Yes Did You Attend College?: Yes What Type of College Degree Do you Have?: BS Did You Attend Graduate School?: No What Was Your Major?: communication Did You Have An Individualized Education Program (IIEP): No Did You Have Any Difficulty At School?: No Patient's Education Has Been Impacted by Current Illness: No   CCA Family/Childhood History Family and Relationship History: Family history Marital status: Divorced Does patient have children?: Yes How many children?: 3 How is patient's relationship with their children?: 48 yr old daughter. 76 yr old, son, 64 yr old son.  Childhood History:   Childhood History By whom was/is the patient raised?: Both parents Additional childhood history information: had a wonderful childhoo very supportive family Description of patient's relationship with caregiver when they were a child: Postive Patient's description of current relationship with people who raised him/her: gets along with father who lives in [place]. Mother is decease How were you disciplined when you got in trouble as a child/adolescent?: appropriate Does patient have siblings?: Yes Number of Siblings: 6 Description of patient's current relationship with siblings: 5 girls. had one brother who passed from alcohol  complications 8 years ago. Did patient suffer any verbal/emotional/physical/sexual abuse as a child?: No Did patient suffer from severe childhood neglect?: No Has patient ever been sexually abused/assaulted/raped as an adolescent or adult?: No Was the patient ever a victim of a crime or a disaster?: No Witnessed domestic violence?: No Has patient been affected by domestic violence as an adult?: No  Child/Adolescent Assessment:     CCA Substance Use Alcohol /Drug Use: Alcohol  / Drug Use Pain Medications: none Prescriptions: Medroxprogesterone 2.5 mg, Duloxetine  60mg , Gabapention 300,g tid, Amlodpine Besylate 2.5 qd, Ibuprofen  800mg  q 4 hrs prn, Proprananolol ER 120mg , Naltrexone  50 mh (2) qhs, Guanfacine  1mg  qhs, ASA 81 mh bid for 28 days, Estradiol  1 mg Over the Counter: none History of alcohol  / drug use?: Yes Longest period of sobriety (when/how long): 9 months durring pregnancy x 3, 1.5-2weeks otherwise Negative Consequences of Use: Personal relationships, Legal Substance #1 Name of Substance 1: Alcohol  1 - Age of First Use: 16 tried it and then did not drink until age 50 1 - Amount (size/oz): went from a 3 beers on the weekend and built to 3 or 4 White claws (the big ones), 3 -4 IPA's and then maybe 1-2 glasses of wine 1 - Frequency: daily 1 - Duration:  daily use since age 45 1 - Last Use / Amount: Friday. Drank two white claws 1 - Method of Aquiring: legal 1- Route of Use: oral                       ASAM's:  Six Dimensions of Multidimensional Assessment  Dimension 1:  Acute Intoxication and/or Withdrawal Potential:   Dimension 1:  Description of individual's past and current experiences of substance use and withdrawal: detoxed when in medical hospital prior to surgery  Dimension 2:  Biomedical Conditions and Complications:   Dimension 2:  Description of patient's biomedical conditions and  complications: none  Dimension 3:  Emotional, Behavioral, or Cognitive Conditions and Complications:  Dimension 3:  Description of emotional, behavioral, or cognitive conditions and complications: has depression and anxiety ( substance induced)  Dimension 4:  Readiness to Change:  Dimension 4:  Description of Readiness to Change criteria: wants to change  Dimension 5:  Relapse, Continued use, or Continued Problem Potential:  Dimension 5:  Relapse, continued use, or continued problem potential critiera description: has used for  30 years daily. last use last Friday. Desires to be abtinent  Dimension 6:  Recovery/Living Environment:     ASAM Severity Score: ASAM's Severity Rating Score: 11  ASAM Recommended Level of Treatment: ASAM Recommended Level of Treatment: Level II Intensive Outpatient Treatment    Deborra meets medical necessity for Level II, Intensive Outpt services according to ASAM critiera: Substance use Disorder (SUD): Alcohol  Use Disorder, Substance Induced Mood disorder    Recommendations for Services/Supports/Treatments: Recommendations for Services/Supports/Treatments Recommendations For Services/Supports/Treatments: CD-IOP Intensive Chemical Dependency Program  DSM5 Diagnoses: Patient Active Problem List   Diagnosis Date Noted   Olecranon fracture-right with fixation 01/22/2024   Serum iron raised 12/23/2023   Abnormal SPEP  12/23/2023   Dizziness 12/23/2023   Alcohol  withdrawal syndrome without complication (HCC) 12/09/2023   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    Patient Centered Plan: Patient is on the following Treatment Plan(s):  Substance use   Referrals to Alternative Service(s): Referred to Alternative Service(s):   Place:   Date:   Time:    Referred to Alternative Service(s):   Place:   Date:   Time:    Referred to Alternative Service(s):   Place:   Date:   Time:    Referred to Alternative Service(s):   Place:   Date:   Time:      Collaboration of Care: none  Patient/Guardian was advised Release of Information must be obtained prior to any record release in order to collaborate their care with an outside provider. Patient/Guardian was advised if they have not already done so to contact the registration department to sign all necessary forms in order for us  to release information regarding their care.   Consent: Patient/Guardian gives verbal consent for treatment and assignment of benefits for services provided during this visit. Patient/Guardian expressed understanding and agreed to proceed.   [name], MS, LMFT, LCAS

## 2024-02-11 ENCOUNTER — Encounter: Payer: Self-pay | Admitting: Family Medicine

## 2024-02-11 ENCOUNTER — Ambulatory Visit: Payer: Self-pay | Admitting: Family Medicine

## 2024-02-11 ENCOUNTER — Ambulatory Visit (INDEPENDENT_AMBULATORY_CARE_PROVIDER_SITE_OTHER): Admitting: Medical

## 2024-02-11 DIAGNOSIS — F1994 Other psychoactive substance use, unspecified with psychoactive substance-induced mood disorder: Secondary | ICD-10-CM

## 2024-02-11 DIAGNOSIS — F1020 Alcohol dependence, uncomplicated: Secondary | ICD-10-CM | POA: Diagnosis not present

## 2024-02-11 MED ORDER — VITAMIN D (ERGOCALCIFEROL) 1.25 MG (50000 UNIT) PO CAPS: 50000.0000 [IU] | ORAL_CAPSULE | ORAL | 0 refills | Status: DC

## 2024-02-11 NOTE — Progress Notes (Deleted)
 Psychiatric Initial Adult Assessment   Patient Identification: Leah Jones MRN:  [id number] Date of Evaluation:  02/11/2024 Referral Source: *** Chief Complaint:  No chief complaint on file.  Visit Diagnosis: No diagnosis found.  History of Present Illness:  ***  Associated Signs/Symptoms: Depression Symptoms:  {DEPRESSION SYMPTOMS:20000} (Hypo) Manic Symptoms:  {BHH MANIC SYMPTOMS:22872} Anxiety Symptoms:  {BHH ANXIETY SYMPTOMS:22873} Psychotic Symptoms:  {BHH PSYCHOTIC SYMPTOMS:22874} PTSD Symptoms: {BHH PTSD SYMPTOMS:22875}  Past Psychiatric History: ***  Previous Psychotropic Medications: {YES/NO:21197}  Substance Abuse History in the last 12 months:  {yes no:314532}  Consequences of Substance Abuse: {BHH CONSEQUENCES OF SUBSTANCE ABUSE:22880}  Past Medical History:  Past Medical History:  Diagnosis Date   ADD (attention deficit disorder)    ? alcohol  related   Alcohol  dependence, daily use (HCC) 11/17/2018   Alcoholism (HCC)    per record   Allergic reaction 09/24/2022   ANAL FISSURE, HX OF 04/15/2007   Annotation: occasional rectal bleeding Qualifier: Diagnosis of  By: Amon MD, Aloysius BRAVO.    Angio-edema    B12 deficiency    Carpal tunnel syndrome    Carpal tunnel syndrome 12/01/2009   Qualifier: Diagnosis of  By: Amon MD, Jose E.    COVID-19 virus infection 08/23/2019   Diarrhea 11/17/2018   Discoloration of skin of foot 05/20/2014   Purplish discoloration top of L foot    Eczema    Eustachian tube dysfunction 01/25/2014   Mallet deformity of fifth finger, left, acquired 06/19/2015   Suicidal ideation    Urticaria     Past Surgical History:  Procedure Laterality Date   COLPOSCOPY  2020   pt reported benign.    FOOT SURGERY Left    Morton's neuroma, Dr Magdalen   PROBING OF NASOLACRIMAL DUCT, SILICONE TUBE, WITH OR WITHOUT IRRIGATION; WITH INSERTION OF TUBE OR STENT  09/24/2023   RHINOPLASTY     TONSILLECTOMY      Family Psychiatric History: ***  Family  History:  Family History  Problem Relation Age of Onset   Stomach cancer Mother    Colon polyps Mother    Asthma Mother    Colon cancer Mother    Breast cancer Mother    Celiac disease Mother    Colon polyps Father    Alcoholism Father    Colon polyps Sister    Asthma Sister    Anxiety disorder Sister    ADD / ADHD Sister    Colon polyps Brother    Bipolar disorder Brother    Pancreatic cancer Brother    Lung cancer Paternal Grandmother    Pancreatic cancer Paternal Grandfather    Asthma Daughter    Asthma Son    Eczema Son    Bipolar disorder Son    Diabetes Other    Hypertension Other    Esophageal cancer Neg Hx    Rectal cancer Neg Hx     Social History:   Social History   Socioeconomic History   Marital status: Divorced    Spouse name: Not on file   Number of children: 3   Years of education: Not on file   Highest education level: Not on file  Occupational History   Occupation: Public affairs consultant: [employer]  Tobacco Use   Smoking status: Some Days    Current packs/day: 0.50    Types: Cigarettes   Smokeless tobacco: Never  Vaping Use   Vaping status: Never Used  Substance and Sexual Activity   Alcohol  use:  Yes    Comment: Drinking 6 days a week. 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 : Never true  Transportation Needs: Unmet Transportation Needs (02/06/2024)   Received from [facility]   PRAPARE - Transportation    In  the past 12 months, has lack of transportation kept you from medical appointments or from getting medications?: Yes    In the past 12 months, has lack of transportation kept you from meetings, work, or from getting things needed for daily living?: No  Physical Activity: Not on file  Stress: Not on file  Social Connections: Not on file    Additional Social History: ***  Allergies:   Allergies  Allergen Reactions   Bactrim [Sulfamethoxazole-Trimethoprim ] Anaphylaxis   Sulfa Antibiotics Anaphylaxis   Lisinopril  Other (See Comments)    ? angioedema   Seroquel  [Quetiapine  Fumarate] Other (See Comments)    Increased sedation on low dose and leg weakness    Metabolic Disorder Labs: Lab Results  Component Value Date   HGBA1C 4.9 03/05/2022   MPG 82 12/12/2020   No results found for: PROLACTIN Lab Results  Component Value Date   CHOL 189 03/05/2022   TRIG 64.0 03/05/2022   HDL 85.30 03/05/2022   CHOLHDL 2 03/05/2022   VLDL 12.8 03/05/2022   LDLCALC 91 03/05/2022   LDLCALC 78 12/12/2020   Lab Results  Component Value Date   TSH 1.75 11/14/2023    Therapeutic Level Labs: No results found for: LITHIUM No results found for: CBMZ No results found for: VALPROATE  Current Medications: Current Outpatient Medications  Medication Sig Dispense Refill   amLODipine  (NORVASC ) 10 MG tablet Take 1 tablet (10 mg total) by mouth daily. 90 tablet 1   aspirin EC 81 MG tablet Take 81 mg by mouth.     atomoxetine  (STRATTERA ) 40 MG capsule Take 40 mg by mouth every morning.     b complex vitamins capsule Take by mouth.     cetirizine  (ZYRTEC ) 10 MG tablet Take 1 tablet (10 mg total) by mouth at bedtime. 90 tablet 3   DULoxetine  (CYMBALTA ) 60 MG capsule Take 1 capsule (60 mg total) by mouth daily. 90 capsule 1   EPINEPHrine  0.3 mg/0.3 mL IJ SOAJ injection Inject 0.3 mg into the muscle as needed for anaphylaxis. 2 each 1   estradiol  (ESTRACE ) 1 MG tablet Take 1 mg by mouth daily.      fluticasone  (FLONASE ) 50 MCG/ACT nasal spray 1 spray daily.     folic acid  (FOLVITE ) 1 MG tablet Take 1 tablet (1 mg total) by mouth daily. 90 tablet 1   gabapentin  (NEURONTIN ) 300 MG capsule Take 1 capsule (300 mg total) by mouth 3 (three) times daily. 90 capsule 5   guanFACINE  (TENEX ) 1 MG tablet Take 1 tablet (1 mg total) by mouth at bedtime. 90 tablet 1  medroxyPROGESTERone (PROVERA) 2.5 MG tablet Take 2.5 mg by mouth daily.     Melatonin 3 MG SUBL Take 1 tablet by mouth at bedtime as needed.     naltrexone  (DEPADE) 50 MG tablet Take 2 tablets (100 mg total) by mouth at bedtime. 180 tablet 1   propranolol  ER (INDERAL  LA) 120 MG 24 hr capsule Take 1 capsule (120 mg total) by mouth daily. 90 capsule 1   No current facility-administered medications for this visit.    Musculoskeletal: Strength & Muscle Tone: {desc; muscle tone:32375} Gait & Station: {PE GAIT ED WJUO:77474} Patient leans: {Patient Leans:21022755}  Psychiatric Specialty Exam: Review of Systems  Last menstrual period 04/18/2019.There is no height or weight on file to calculate BMI.  General Appearance: {Appearance:22683}  Eye Contact:  {BHH EYE CONTACT:22684}  Speech:  {Speech:22685}  Volume:  {Volume (PAA):22686}  Mood:  {BHH MOOD:22306}  Affect:  {Affect (PAA):22687}  Thought Process:  {Thought Process (PAA):22688}  Orientation:  {BHH ORIENTATION (PAA):22689}  Thought Content:  {Thought Content:22690}  Suicidal Thoughts:  {ST/HT (PAA):22692}  Homicidal Thoughts:  {ST/HT (PAA):22692}  Memory:  {BHH MEMORY:22881}  Judgement:  {Judgement (PAA):22694}  Insight:  {Insight (PAA):22695}  Psychomotor Activity:  {Psychomotor (PAA):22696}  Concentration:  {Concentration:21399}  Recall:  {BHH GOOD/FAIR/POOR:22877}  Fund of Knowledge:{BHH GOOD/FAIR/POOR:22877}  Language: {BHH GOOD/FAIR/POOR:22877}  Akathisia:  {BHH YES OR NO:22294}  Handed:  {Handed:22697}  AIMS (if indicated):  {Desc; done/not:10129}  Assets:  {Assets  (PAA):22698}  ADL's:  {BHH JIO'D:77709}  Cognition: {chl bhh cognition:304700322}  Sleep:  {BHH GOOD/FAIR/POOR:22877}   Screenings: AIMS    Flowsheet Row [facility]  AIMS Total Score 0   AUDIT    Flowsheet Row [facility]  Alcohol  Use Disorder Identification Test Final Score (AUDIT) 4   GAD-7    Flowsheet Row Office Visit from 11/14/2023 in [facility] [place] Office Visit from 12/10/2022 in [facility] [place] Office Visit from 11[facility]20/2022 in [facility] [place] Office Visit from 09/14/2019 in [facility] [place] [facility] [place] Office Visit from 04/22/2019 in [facility]  Total GAD-7 Score 5 0 3 12 7    PHQ2-9    Flowsheet Row Office Visit from 11/14/2023 in [facility] [place] Office Visit from 12/10/2022 in [facility] [place] Office Visit from 11/20/2022 in [facility] [place] Office Visit from 03/05/2022 in [facility] [place] [facility] [place] Office Visit from 12/12/2020 in [facility] [place] [facility] [place]  PHQ-2 Total Score 2 0 0 0 0  PHQ-9 Total Score 12 0 10 -- --   Flowsheet Row ED from 08/23/2022 in [facility] at [facility]  C-SSRS RISK CATEGORY No Risk    Assessment and Plan: ***  Collaboration of Care: [facility] OP Collaboration of Care:[id number]}  Patient/Guardian was advised Release of Information must be obtained prior to any record release in order to collaborate their care with an outside provider. Patient/Guardian was advised if they have not already done so to contact the registration department to sign all necessary forms in order for us  to release information regarding their care.   Consent:  Patient/Guardian gives verbal consent for treatment and assignment of benefits for services provided during this visit. Patient/Guardian expressed understanding and agreed  to proceed.   [name], PA-C 7/23/202510:34 AM
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
## 2024-02-11 NOTE — Progress Notes (Signed)
 Results and recommendations given to pt. She was advised to only take the vitamin D  supplement once a week.

## 2024-02-13 ENCOUNTER — Telehealth (HOSPITAL_COMMUNITY): Payer: Self-pay | Admitting: Licensed Clinical Social Worker

## 2024-02-13 ENCOUNTER — Ambulatory Visit (INDEPENDENT_AMBULATORY_CARE_PROVIDER_SITE_OTHER): Admitting: Medical

## 2024-02-13 ENCOUNTER — Encounter (HOSPITAL_COMMUNITY): Payer: Self-pay | Admitting: Medical

## 2024-02-13 DIAGNOSIS — Z91199 Patient's noncompliance with other medical treatment and regimen due to unspecified reason: Secondary | ICD-10-CM

## 2024-02-13 NOTE — Telephone Encounter (Signed)
 The therapist attempts to reach [facility] after she no shows for group leaving a HIPAA-compliant voicemail.  [name], MA, LCSW, [facility], LCAS 02/13/2024

## 2024-02-13 NOTE — Progress Notes (Signed)
 Patient ID: Leah Jones, female   DOB: [birth year], 63 y.o.   MRN: [id number] Pt scheduled for IOP Provider intake assessment but is a no show for IOP

## 2024-02-14 LAB — VITAMIN B1: Vitamin B1 (Thiamine): 16 nmol/L (ref 8–30)

## 2024-02-16 ENCOUNTER — Ambulatory Visit (INDEPENDENT_AMBULATORY_CARE_PROVIDER_SITE_OTHER): Admitting: Licensed Clinical Social Worker

## 2024-02-16 ENCOUNTER — Telehealth (HOSPITAL_COMMUNITY): Payer: Self-pay | Admitting: Licensed Clinical Social Worker

## 2024-02-16 DIAGNOSIS — F1994 Other psychoactive substance use, unspecified with psychoactive substance-induced mood disorder: Secondary | ICD-10-CM

## 2024-02-16 DIAGNOSIS — F1020 Alcohol dependence, uncomplicated: Secondary | ICD-10-CM
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
## 2024-02-16 NOTE — Telephone Encounter (Signed)
 Hedy leaves a voicemail apologizing for missing group on Friday saying that her ankle was swollen and that she has put in a call to her orthopedist but plans on being in group on Monday.  She says that she may likely miss group on Friday due to having to attend a funeral.  [name], [place], [place], [facility], LCAS 02/16/2024

## 2024-02-17 ENCOUNTER — Ambulatory Visit (INDEPENDENT_AMBULATORY_CARE_PROVIDER_SITE_OTHER): Payer: Self-pay | Admitting: Family Medicine

## 2024-02-17 DIAGNOSIS — Z91199 Patient's noncompliance with other medical treatment and regimen due to unspecified reason: Secondary | ICD-10-CM

## 2024-02-17 NOTE — Progress Notes (Signed)
    Same-day cancel.  Canceled at 12:07 PM for a 1:20 PM appointment

## 2024-02-17 NOTE — Patient Instructions (Signed)

## 2024-02-18 ENCOUNTER — Ambulatory Visit (INDEPENDENT_AMBULATORY_CARE_PROVIDER_SITE_OTHER): Admitting: Medical

## 2024-02-18 ENCOUNTER — Encounter (HOSPITAL_COMMUNITY): Payer: Self-pay | Admitting: Medical

## 2024-02-18 DIAGNOSIS — I10 Essential (primary) hypertension: Secondary | ICD-10-CM | POA: Diagnosis not present

## 2024-02-18 DIAGNOSIS — S42301G Unspecified fracture of shaft of humerus, right arm, subsequent encounter for fracture with delayed healing: Secondary | ICD-10-CM

## 2024-02-18 DIAGNOSIS — Z8659 Personal history of other mental and behavioral disorders: Secondary | ICD-10-CM | POA: Diagnosis not present

## 2024-02-18 DIAGNOSIS — E559 Vitamin D deficiency, unspecified: Secondary | ICD-10-CM

## 2024-02-18 DIAGNOSIS — R296 Repeated falls: Secondary | ICD-10-CM | POA: Diagnosis not present

## 2024-02-18 DIAGNOSIS — F1020 Alcohol dependence, uncomplicated: Secondary | ICD-10-CM | POA: Diagnosis not present

## 2024-02-18 DIAGNOSIS — S8291XG Unspecified fracture of right lower leg, subsequent encounter for closed fracture with delayed healing: Secondary | ICD-10-CM

## 2024-02-18 DIAGNOSIS — F1994 Other psychoactive substance use, unspecified with psychoactive substance-induced mood disorder: Secondary | ICD-10-CM | POA: Diagnosis not present

## 2024-02-18 DIAGNOSIS — E538 Deficiency of other specified B group vitamins: Secondary | ICD-10-CM | POA: Diagnosis not present

## 2024-02-18 DIAGNOSIS — G621 Alcoholic polyneuropathy: Secondary | ICD-10-CM

## 2024-02-18 DIAGNOSIS — Z91038 Other insect allergy status: Secondary | ICD-10-CM | POA: Diagnosis not present

## 2024-02-18 DIAGNOSIS — J301 Allergic rhinitis due to pollen: Secondary | ICD-10-CM | POA: Diagnosis not present
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
## 2024-02-18 NOTE — Progress Notes (Unsigned)
 Daily Group Progress Note   Program: CD IOP     Group Time: 9 a.m. to 12 p.m.      Type of Therapy: Process and Psychoeducational    Topic: The therapists check in with group members, assess for SI/HI/psychosis and overall level of functioning. The therapists inquire about sobriety date and number of community support meetings attended since last session.   Therapist present information on the following topics and prompts discussion among group members:  the important of having sober support,  the need to avoid people, places and things associated with use, how to play it forward to examine where one will end up if they continue their use, normalcy of having grief during a divorce, ASAM placement criteria and how decisions on placement are made, stages of change.     Summary: Leah Jones presents today rating her depression as a 5 and her anxiety is a 7.  She reports she is going to a meeting tonight/ She identifies her emotions as excited (about going to the meeting) and overwhelmed (at the number of appointments she has and other things she has to do.  Leah Jones says she is moving her stuff to a friend who has a condo for their brother who has limited vision and is not able to drive. Leah Jones says the brother cannot go upstairs and so he cannot help her move her belongings up to the 2nd floor.    Leah Jones reports she drank 2 white claws yesterday to keep her mind entertained while she was packing/moving. Therapists discuss [employer] as a potential living situation given her struggle to drink when alone.  She says her friend is letting her stay at no cost in this condo.  Leah Jones discusses going to a funeral of someone she knows on Friday.  She indicated after the service there would be a reception that will have alcohol . Therapist discuss with Leah Jones, given she will see people she knows, it could be possible these people could talk her into going to the reception, as Leah Jones says she will leave after the  funeral.  Leah Jones says she just wants to hear what they say about her friend who is deceased.    Progress Towards Goals: Leah Jones reports that today is her new sobriety date.   UDS collected: No  Results: No   AA/NA attended?:  No   Sponsor?:  No   Leah Simpler, Leah Jones, LCAS [address] [place], [place], [facility], LCAS  02/18/2024

## 2024-02-19 ENCOUNTER — Encounter (HOSPITAL_COMMUNITY): Payer: Self-pay | Admitting: Medical

## 2024-02-19 DIAGNOSIS — H52223 Regular astigmatism, bilateral: Secondary | ICD-10-CM | POA: Diagnosis not present

## 2024-02-19 DIAGNOSIS — H47323 Drusen of optic disc, bilateral: Secondary | ICD-10-CM | POA: Diagnosis not present

## 2024-02-19 DIAGNOSIS — H1032 Unspecified acute conjunctivitis, left eye: Secondary | ICD-10-CM | POA: Diagnosis not present

## 2024-02-19 DIAGNOSIS — H04223 Epiphora due to insufficient drainage, bilateral lacrimal glands: Secondary | ICD-10-CM | POA: Diagnosis not present

## 2024-02-19 DIAGNOSIS — H01029 Squamous blepharitis unspecified eye, unspecified eyelid: Secondary | ICD-10-CM | POA: Diagnosis not present

## 2024-02-19 NOTE — Progress Notes (Signed)
 Patient ID: Leah Jones, female   DOB: February 18, 1961, 63 y.o.   MRN: [id number]

## 2024-02-20 ENCOUNTER — Ambulatory Visit (INDEPENDENT_AMBULATORY_CARE_PROVIDER_SITE_OTHER): Admitting: Licensed Clinical Social Worker

## 2024-02-20 DIAGNOSIS — F1994 Other psychoactive substance use, unspecified with psychoactive substance-induced mood disorder: Secondary | ICD-10-CM

## 2024-02-20 DIAGNOSIS — F1020 Alcohol dependence, uncomplicated: Secondary | ICD-10-CM

## 2024-02-20 DIAGNOSIS — S52021D Displaced fracture of olecranon process without intraarticular extension of right ulna, subsequent encounter for closed fracture with routine healing: Secondary | ICD-10-CM | POA: Diagnosis not present

## 2024-02-20 DIAGNOSIS — S52021S Displaced fracture of olecranon process without intraarticular extension of right ulna, sequela: Secondary | ICD-10-CM | POA: Diagnosis not present

## 2024-02-20 NOTE — Progress Notes (Signed)
 Daily Group Progress Note   Program: CD IOP     Group Time: 9 a.m. to 12 p.m.      Type of Therapy: Process and Psychoeducational    Topic: The therapist checks in with group members, assesses for SI/HI/psychosis and overall level of functioning. The therapist inquires about sobriety date and number of community support meetings attended since last session.   The therapist introduces a new group member and asks for existing group members to add a summary of their story of how they came to be in group to the usual check-in items. The therapist facilitates group discussion on array of subjects such as escaping toxic families and toxic relationships, the need to avoid triggers, and the need to trust what people with years of successful recovery have to say about what will and will not work in recovery. The therapist discusses the dangerous, thrill-seeking behaviors that many addicts engage in relating it to their lack of dopamine receptors while explaining the unsustainability of staying on this roller coaster ride. The therapist discusses the different house conditions that arise due to long-term, chronic substance use that can result in death and answers group members questions about Wernicke-Korsakoff Syndrome.      Summary: Leah Jones presents rating her depression as a 4 and her anxiety as a 5. She describes her mood as busy and inspired.   She says that she is the 5th of 6 kids saying that three of her siblings are alcoholics and that one of her brothers died for alcoholic pancreatitis and that she found him dead.   Leah Jones says that she probably knew that she had a drinking problem when she was in high school. During her 87 year marriage, her husband told her that she had a problem as she drank every day after putting the kids to bed. She eventually went to [facility] to appease him but came home and he had moved out of the home with the kids. She went to meetings for a while but became  drinking buddies with a woman she met in treatment saying that this woman ended up dying 5 years ago due to cirrhosis. When Leah Jones reached out to another of their drinking friends to tell her of this death, she discovered that this other woman was dead too.   She says that her family was worried she would end up dying so persuaded her to go to [facility] in January of this year. She was doing well and attending meetings every day; however, she remained in the relationship with her alcoholic boyfriend who would go out and drink with Leah Jones saying that she knew where he stashed the liquor. When he told her that he was going to break up with her, she ended up relapsing. The therapist observes that her alcoholic boyfriend did not want her falling down drunk; however, at the same time did not want her completely sober saying that they really did not know each other sober.   Leah Jones says that she was in love with this man who reportedly continues to drink talking about wanting to remain friends with him. She is surprised to hear from this therapist and another group member that if she were working her program that she should avoid contact with him due to the fact that he is drinking.   She did not attend a meeting as planned after last group but says that she is going to [employer] tonight to get with a woman she knows who  is actually in recovery. The therapist and the other group member point out what a positive step Leah Jones made in coming to group today rather than the funeral. Leah Jones recognized that there would be people at the funeral who would have pressured her to stay at the reception and drink.    Progress Towards Goals: Leah Jones reports no change in her sobriety date.   UDS collected: No Results: Yes, positive for ETOH   AA/NA attended?:  No   Sponsor?:  No     Leah Jones, [place], [place], [facility], LCAS 02/20/2024

## 2024-02-23 ENCOUNTER — Telehealth (HOSPITAL_COMMUNITY): Payer: Self-pay | Admitting: Licensed Clinical Social Worker

## 2024-02-23 ENCOUNTER — Ambulatory Visit (HOSPITAL_COMMUNITY)

## 2024-02-23 DIAGNOSIS — F1020 Alcohol dependence, uncomplicated: Secondary | ICD-10-CM | POA: Diagnosis not present

## 2024-02-23 NOTE — Telephone Encounter (Signed)
 The therapist attempts to reach [facility] leaving a HIPAA-compliant voicemail.  [name], MA, LCSW, [facility], LCAS 02/23/2024

## 2024-02-24 ENCOUNTER — Telehealth (HOSPITAL_COMMUNITY): Payer: Self-pay | Admitting: Licensed Clinical Social Worker

## 2024-02-24 NOTE — Telephone Encounter (Signed)
 Leah Jones leaves a voicemail indicating that she may not be in group as she got her cast off on Friday and has to go in to her doctor's office to have something done in relation to this which she says is difficult to explain.  She says that if she is not in group on Monday that she will return to group on Wednesday.   [name], MA, LCSW, [facility], LCAS 02/24/2024

## 2024-02-25 ENCOUNTER — Ambulatory Visit (INDEPENDENT_AMBULATORY_CARE_PROVIDER_SITE_OTHER)

## 2024-02-25 DIAGNOSIS — F1020 Alcohol dependence, uncomplicated: Secondary | ICD-10-CM

## 2024-02-25 DIAGNOSIS — F1994 Other psychoactive substance use, unspecified with psychoactive substance-induced mood disorder: Secondary | ICD-10-CM

## 2024-02-25 NOTE — Progress Notes (Signed)
 Daily Group Progress Note   Program: CD IOP     Group Time: 9 a.m. to 12 p.m.      Type of Therapy: Process and Psychoeducational    Topic: The therapist checks in with group members, assesses for SI/HI/psychosis and overall level of functioning. The therapist inquires about sobriety date and number of community support meetings attended since last session.    The therapist introduce the new group member.  Therapists asks group members to give an overview of what led them to CD-IOP so the new member could get to know them.  Therapist discusses the Jelliniek curve, including the components of the progression of the disease, nothing there is a crucial Phase and a Chronic Phase.  Therapist points out that 1 out of 10 drinkers/substance users have the brain disease of addiction.  Therapist prompts discussion on the topics that fall under that crucial and Chronic Phases of Addiction.    Summary: Leah Jones presents rating her depression as a 5 and her anxiety as a 6.  She says she has been to a meeting since last group.  She says she talked with her preacher and another lady who leads a 12 step group and she asked her if she would be her sponsor and the woman said, yes. Leah Jones says she has not started calling her yet, because she has not formally ended the relationship with her current sponsor. She says the current sponsor was not working out.  Leah Jones identifies her emotions as let down and hurt.  She says on Saturday her sister sent her pictures of her grand-daughter.  Leah Jones says it is not fair that her daughter won't let her see her grand-daughter. Leah Jones says she saw her sister on Saturday and thought her sister was supportive but Leah Jones says her sister has blocked Leah Jones from calling her.   She says she was so upset that she drank. Leah Jones did not at first share details, however therapist explains how it can be helpful to dissect what happened to see what she can learn from this slip and what she might do  different next time when she faces emotional stress.  Leah Jones then shares she did not have alcohol  in the house, so she left and went to the convenience store and bought 3 white claws and brought them home and she drank them which helped her to deal with the emotional stress.  Therapist invites the Leah Jones and the group to come up with coping skills she might have used to prevent that slip.  One group member says when she goes to the grocery store, she has someone on the phone to help provide support, so that she does not go buy alcohol .  Leah Jones says she did not call her sponsor because she had not been helpful and did not feel right in calling her new sponsor because she has not formally ended that relationship.  Therapist inquires if she has numbers from ladies in the meetings and she says she does not have any.  Therapist discusses the important of having a support system as recovery does not occur in isolation.   Progress Towards Goals: Leah Jones reports a new sobriety date of 02-22-24.   UDS collected: Yes Results: None   AA/NA attended?:  Yes   Sponsor?:   in between sponsors     Leah Jones Simpler, MS, LMFT, LCAS 02/25/2024

## 2024-02-27 ENCOUNTER — Ambulatory Visit (HOSPITAL_COMMUNITY)

## 2024-02-27 DIAGNOSIS — H47323 Drusen of optic disc, bilateral: Secondary | ICD-10-CM | POA: Diagnosis not present

## 2024-02-27 DIAGNOSIS — H524 Presbyopia: Secondary | ICD-10-CM | POA: Diagnosis not present

## 2024-02-27 DIAGNOSIS — H2513 Age-related nuclear cataract, bilateral: Secondary | ICD-10-CM | POA: Diagnosis not present

## 2024-02-27 DIAGNOSIS — H04223 Epiphora due to insufficient drainage, bilateral lacrimal glands: Secondary | ICD-10-CM | POA: Diagnosis not present

## 2024-02-27 DIAGNOSIS — F1020 Alcohol dependence, uncomplicated: Secondary | ICD-10-CM

## 2024-02-27 DIAGNOSIS — F1994 Other psychoactive substance use, unspecified with psychoactive substance-induced mood disorder: Secondary | ICD-10-CM

## 2024-02-27 DIAGNOSIS — H52222 Regular astigmatism, left eye: Secondary | ICD-10-CM | POA: Diagnosis not present

## 2024-02-27 DIAGNOSIS — H01029 Squamous blepharitis unspecified eye, unspecified eyelid: Secondary | ICD-10-CM | POA: Diagnosis not present

## 2024-02-27 DIAGNOSIS — H5213 Myopia, bilateral: Secondary | ICD-10-CM | POA: Diagnosis not present

## 2024-02-27 NOTE — Progress Notes (Signed)
 Daily Group Progress Note   Program: CD IOP     Group Time: 9 a.m. to 12 p.m.      Type of Therapy: Process and Psychoeducational    Topic: The therapist checks in with group members, assesses for SI/HI/psychosis and overall level of functioning. The therapist inquires about sobriety date and number of community support meetings attended since last session.   The therapists explain the reason that this program is abstinence-based and does not admit individuals who are on prescribed controlled substances and/or self-medicating with marijuana for any sort of medical condition. The therapists begin to educate group members on the harmful effects of cannabis. The therapists answer group members' questions regarding MAT for opioid use disorders while also discussing Vivitrol  in relation to alcohol  use disorders. The therapists show group members a short video on the impact of baclofen  in reducing cravings for alcohol  and cocaine. The therapists answer group members' questions about the impact of drugs and alcohol  on the brain emphasizing that people's brain functioning improves the longer a person is sober and that so does the quality of their lives. The therapists encourage reaching out to sober supports to cope with life stressors versus using substances.      Summary: Leah Jones presents rating her depression as a 5 and her anxiety as a 6. Leah Jones reports the same sobriety date (02-24-24).  She says she has not attended a meeting since last IOP was on 02-21-24. nor does she have a sponsor.  Leah Jones identifies her emotions as relieved and busy.  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   She says she has to get things together to send to the school that they require.   Therapist asks if she has worked on the Step 1 work Teacher, early years/pre and she says she has not had time. Leah Jones discusses her frustration that she cannot see her granddaughter. Therapists explore the reason for that and she says it was because of  her drinking. Therapists discuss how it takes time being sober for family members to start trusting their loved one's again that were in active addiction but are starting in recovery. Therapist asks how drinking helps her to get closer to seeing her granddaughter. She says she had not thought about it this way.   Progress Towards Goals: Leah Jones reports no change in her sobriety date.   UDS collected: No Results: none   AA/NA attended?:  yes   Sponsor?:  No    Leah Simpler, MS, LMFT, LCAS [address], [place], [place], [facility], LCAS 02/27/2024

## 2024-03-01 ENCOUNTER — Telehealth (HOSPITAL_COMMUNITY): Payer: Self-pay | Admitting: Licensed Clinical Social Worker

## 2024-03-01 ENCOUNTER — Ambulatory Visit (HOSPITAL_COMMUNITY)

## 2024-03-01 NOTE — Telephone Encounter (Signed)
 Rex calls saying that she did not make group today as she had to go get her picture taken for her [employer] having scheduled this months ago.   [name], MA, LCSW, [facility], LCAS 03/01/2024

## 2024-03-03 ENCOUNTER — Ambulatory Visit (INDEPENDENT_AMBULATORY_CARE_PROVIDER_SITE_OTHER): Admitting: Medical

## 2024-03-03 ENCOUNTER — Encounter (HOSPITAL_COMMUNITY): Payer: Self-pay | Admitting: Medical

## 2024-03-03 DIAGNOSIS — F1020 Alcohol dependence, uncomplicated: Secondary | ICD-10-CM

## 2024-03-03 DIAGNOSIS — G621 Alcoholic polyneuropathy: Secondary | ICD-10-CM

## 2024-03-03 DIAGNOSIS — I10 Essential (primary) hypertension: Secondary | ICD-10-CM

## 2024-03-03 DIAGNOSIS — Z91038 Other insect allergy status: Secondary | ICD-10-CM

## 2024-03-03 DIAGNOSIS — R296 Repeated falls: Secondary | ICD-10-CM

## 2024-03-03 DIAGNOSIS — E559 Vitamin D deficiency, unspecified: Secondary | ICD-10-CM

## 2024-03-03 DIAGNOSIS — F1994 Other psychoactive substance use, unspecified with psychoactive substance-induced mood disorder: Secondary | ICD-10-CM

## 2024-03-03 DIAGNOSIS — E538 Deficiency of other specified B group vitamins: Secondary | ICD-10-CM

## 2024-03-03 DIAGNOSIS — S42301G Unspecified fracture of shaft of humerus, right arm, subsequent encounter for fracture with delayed healing: Secondary | ICD-10-CM

## 2024-03-03 DIAGNOSIS — Z8659 Personal history of other mental and behavioral disorders: Secondary | ICD-10-CM

## 2024-03-03 DIAGNOSIS — S8291XG Unspecified fracture of right lower leg, subsequent encounter for closed fracture with delayed healing: Secondary | ICD-10-CM

## 2024-03-03 DIAGNOSIS — R748 Abnormal levels of other serum enzymes: Secondary | ICD-10-CM

## 2024-03-03 DIAGNOSIS — J301 Allergic rhinitis due to pollen: Secondary | ICD-10-CM

## 2024-03-03 MED ORDER — BACLOFEN 10 MG PO TABS: 10.0000 mg | ORAL_TABLET | Freq: Three times a day (TID) | ORAL | 1 refills | Status: DC

## 2024-03-03 NOTE — Progress Notes (Signed)
 [facility] Follow-up Outpatient CDIOP Date: 03/03/2024  Admission Date:02/11/24  Sobriety date: Used 2 days ago Unable to establish since entering IOP  Subjective: I dont agree  HPI : CD IOP Provider FU Leah Jones is seen today for FU and at request of Counselors due an inability to abstain in treatment.She reports this last episode was started with the thought To heck with it Maybe this will help my shakes (she does not believe these are alcohol  related and has prescriptions from PCP for Neurontin  and Propranolol  for same) She denies the idea that IOP is not helping.She denies she needs a complete Detox to reset her craving brain.She  recalls that she had 90 days in the past  and I just need to do it again. Initially she reported that Naltrxone was sufficient for her/her cravings being absent.Today she inquires about Baclofen  MAT and requests to try.  Counselor's report: Summary:  Leah Jones presents today rating her depression and anxiety as a 6.  She reports she has a new sobriety date which is 03-02-24. She says she went to one meeting since last IOP but is in between sponsors. She has identified a female who agreed to be her sponsor, however her sponsor wants her to formally end the relationship with the other sponsor. When therapist asks what is standing in her way of doing this, she says she has called the sponsor, but the sponsor has not responded. Leah Jones identifies her emotions as isolated and overwhelmed, and lonely. When therapist asks Leah Jones what she is actively doing to participate in recovery she says she can't find anyone to call when she gets the craving to drink.  Therapist inquires as to who she may be trying to contact. She says she is trying to call the people she has meeting in the meetings and they all work. Therapist explains if she cannot get in touch with anyone at anytime, there are meetings happening 24/7 on zoom and others in person.  Leah Jones says she gets  restless and has to get out of the house and she just ends up at [place] because it is around the corner from where she is living.  Therapist discusses how this pattern has to be interrupted if Leah Jones hopes to be in recovery. Therapist asks Leah Jones to do a relapse plan before the next group, as she is struggling to put together sober days. Therapist discusses the ongoing assessment that occurs and how a group member may need to be referred to a higher level of care if they are unable to participate in recovery in their current environment.   She says she is irritated that her new house mate just lies in bed and won't help take care of anything in the house. Leah Jones says 2 days ago, she started craving alcohol  and went to the [employer] and purchased wine.    Review of Systems: Psychiatric: Agitation: See Counselor report Hallucination: No Depressed Mood: see Counselor reports Insomnia: No Hypersomnia: No Altered Concentration:Hx of ADHD (Adult onset/no formal testing) Feels Worthless: PHQ 9 11/14/23  Feeling bad about yourself -  or that you are a failure or have let yourself or your family down  1  Grandiose Ideas: No Belief In Special Powers: No New/Increased Substance Abuse: Yes Compulsions: IAlcohol use  Neurologic: Headache: No Seizure: No Paresthesias: Alcoholic PN  Current Medications:  Your Medication List amLODipine  10 MG tablet Commonly known as: NORVASC  Take 1 tablet (10 mg total) by mouth daily.  aspirin EC 81  MG tablet Take 81 mg by mouth.  atomoxetine  40 MG capsule Commonly known as: STRATTERA  Take 40 mg by mouth every morning.  b complex vitamins capsule Take by mouth.  baclofen  10 MG tablet Commonly known as: LIORESAL  Take 1 tablet (10 mg total) by mouth 3 (three) times daily.  cetirizine  10 MG tablet Commonly known as: ZYRTEC  Take 1 tablet (10 mg total) by mouth at bedtime.  DULoxetine  60 MG capsule Commonly known as: Cymbalta  Take 1 capsule (60 mg  total) by mouth daily.  EPINEPHrine  0.3 mg/0.3 mL Soaj injection Commonly known as: EPI-PEN Inject 0.3 mg into the muscle as needed for anaphylaxis.  estradiol  1 MG tablet Commonly known as: ESTRACE  Take 1 mg by mouth daily.  fluticasone  50 MCG/ACT nasal spray Commonly known as: FLONASE  1 spray daily.  folic acid  1 MG tablet Commonly known as: FOLVITE  Take 1 tablet (1 mg total) by mouth daily.  gabapentin  300 MG capsule Commonly known as: Neurontin  Take 1 capsule (300 mg total) by mouth 3 (three) times daily.  guanFACINE  1 MG tablet Commonly known as: TENEX  Take 1 tablet (1 mg total) by mouth at bedtime.  medroxyPROGESTERone 2.5 MG tablet Commonly known as: PROVERA Take 2.5 mg by mouth daily.  Melatonin 3 MG Subl Take 1 tablet by mouth at bedtime as needed.  naltrexone  50 MG tablet Commonly known as: DEPADE Take 2 tablets (100 mg total) by mouth at bedtime.  propranolol  ER 120 MG 24 hr capsule Commonly known as: INDERAL  LA Take 1 capsule (120 mg total) by mouth daily.  Vitamin D  (Ergocalciferol ) 1.25 MG (50000 UNIT) Caps capsule Commonly known as: DRISDOL  Take 1 capsule (50,000 Units total) by mouth every 7 (seven) days.   Mental Status Examination  Appearance:Pale Slightly unkempt (hair) Alert: Yes Attention: good  Cooperative: Yes Eye Contact: Fair Speech: Clear and coherent, rate WNL Psychomotor Activity: Normal Memory:Intact Concentration/Attention: Normal/intact Oriented: person, place, time/date and situation Mood: Dysthymic Affect:  Congruent Thought Processes and Associations: Coherent and Loose Associations Fund of Knowledge: WDL Thought Content:NO SI/HI-denies she can't abstain Insight:Lacking to Limited Judgement:Impaired  UDS:02/16/2024 ETG 1667 ng/ml                           ETS  >10,000  PDMP: 02/06/2024 02/06/24  1 Gabapentin  300 Mg Capsule 90.00 30   Diagnosis:  Alcohol  use disorder, severe, dependence (HCC) Substance induced mood disorder  (HCC) Multiple falls Multiple closed fractures of right upper and lower extremity with delayed healing Vitamin D  deficiency B12 deficiency Alcoholic peripheral neuropathy (HCC) Primary hypertension Seasonal allergic rhinitis due to pollen Hymenoptera allergy History of ADHD Elevated liver enzymes  Assessment:Struggling not to fall into full blown relapse  Treatment Plan:Recommend Counselors consider full Detox and return to IOP If not (giving her one more try) then anymore slips and she must go   [name], PA-CPatient ID: Leah Jones, female   DOB: 01/11/1961, 63 y.o.   MRN: [id number]

## 2024-03-03 NOTE — Progress Notes (Signed)
 Daily Group Progress Note   Program: CD IOP     Group Time: 9 a.m. to 12 p.m.      Type of Therapy: Process and Psychoeducational    Topic: The therapist checks in with group members, assesses for SI/HI/psychosis and overall level of functioning. The therapist inquires about sobriety date and number of community support meetings attended since last session.   Therapist introduces new group member. Therapist discusses the following issues today and facilitates discussion among the group: ask group members to give a synopsis of what brought them to IOP,  Post Acute Withdrawal Syndrome including asking  group members to identify where they are in this process, the importance of being all in vs continuing to use, how it takes time for loved ones to start trusting when one has been in active addition, dealing with emotions without substances and important skills and goals for 1st year of recovery from addictions and recovery site.     Summary:  Leah Jones presents today rating her depression and anxiety as a 6.  She reports she has a new sobriety date which is 03-02-24. She says she went to one meeting since last IOP but is in between sponsors. She has identified a female who agreed to be her sponsor, however her sponsor wants her to formally end the relationship with the other sponsor. When therapist asks what is standing in her way of doing this, she says she has called the sponsor, but the sponsor has not responded. Leah Jones identifies her emotions as isolated and overwhelmed, and lonely. When therapist asks Leah Jones what she is actively doing to participate in recovery she says she can't find anyone to call when she gets the craving to drink.  Therapist inquires as to who she may be trying to contact. She says she is trying to call the people she has meeting in the meetings and they all work. Therapist explains if she cannot get in touch with anyone at anytime, there are meetings happening 24/7 on zoom  and others in person.  Leah Jones says she gets restless and has to get out of the house and she just ends up at [place] because it is around the corner from where she is living.  Therapist discusses how this pattern has to be interrupted if Leah Jones hopes to be in recovery. Therapist asks Leah Jones to do a relapse plan before the next group, as she is struggling to put together sober days. Therapist discusses the ongoing assessment that occurs and how a group member may need to be referred to a higher level of care if they are unable to participate in recovery in their current environment.  She says she is irritated that her new house mate just lies in bed and won't help take care of anything in the house. Leah Jones says 2 days ago, she started craving alcohol  and went to the [employer] and purchased wine.   Progress Towards Goals: Leah Jones reports her new sobriety date as 03-02-24  UDS collected: Yes  Results: none   AA/NA attended?:  yes   Sponsor?:  No    Leah Simpler, MS, LMFT, LCAS 03/03/2024

## 2024-03-05 ENCOUNTER — Ambulatory Visit (HOSPITAL_COMMUNITY)

## 2024-03-08 ENCOUNTER — Ambulatory Visit (INDEPENDENT_AMBULATORY_CARE_PROVIDER_SITE_OTHER): Admitting: Licensed Clinical Social Worker

## 2024-03-08 DIAGNOSIS — F1020 Alcohol dependence, uncomplicated: Secondary | ICD-10-CM

## 2024-03-08 DIAGNOSIS — F1994 Other psychoactive substance use, unspecified with psychoactive substance-induced mood disorder: Secondary | ICD-10-CM

## 2024-03-08 NOTE — Progress Notes (Signed)
 Daily Group Progress Note   Program: [facility]     Group Time: 9 a.m. to 12 p.m.      Type of Therapy: Process and Psychoeducational    Topic: The therapists check in with group members, assess for SI/HI/psychosis and overall level of functioning. The therapists inquire about sobriety date and number of community support meetings attended since last session.   The therapists introduce a new group member having group members add their story of how they came to be in IOP to group check-in. The therapists explain that a relapse is an opportunity for learning and question how it is that the disease of addiction can trick people in to doing the same thing over and over thinking things will be different the next time. The therapists point out that their disease wants them to focus on a moment in time; however, it is important to play the story forward. The therapists normalize boredom in early recovery as a result of PAWS and discuss how exercise can help to reduce the length and intensity of PAWS.  The therapists also explain in people cannot have healthy friendships or healthy relationships with significant others when one are both parties are in active addiction.  In such a relationship, the person who is an active addiction's primary relationship is the chemical or chemicals that they are abusing.  The therapists pointed out the fact that people in the 12-step program are not perfect but as a group are far more dependable and trustworthy than those who are in active addiction.The therapists suggest that as people progress in recovery that they will learn how to deal with interpersonal conflict more assertively such that they will not just drop out of the program and go back to using substances.  The therapists talk about what it means to be an adult child of an alcoholic or addict.  Lastly, the therapist discuss how people in recovery sometimes expect family members to put things in the past the moment  they enter recovery; however, this fails to recognize that trust will take time to restore and that remaining sober is a prerequisite for restoring this trust.     Summary: Leah Jones presents today rating both her depression and anxiety as a 6.  She says that she has been taking the baclofen  now for about a week and that her cravings have reduced and she notices that she is sleeping better and thinking more clearly.  She shares her story of how she came to be in the intensive outpatient treatment program.  She says that she went to [facility] and was sober for approximately 100 days after going there the second time; however, shortly after discharging her alcoholic boyfriend told her that he was not in love with her.  When Vernell questions his behavior and asks what kind of person is it that would do the things that he has done, the therapist and other group members pointed out that his behavior is quite typical for an alcoholic and active addiction.  [name] admits that she has a tendency to trust people too much.  She says that she attended a meeting last Tuesday and that she now has a sponsor.  She points out to the therapist that she completed her homework of developing a relapse prevention plan.  She has the goal of repairing her relationships with her adult children and questions how her daughter will know that she is doing better and not drinking if she is not communicating with  her.  Therapist explained that her daughter will hear of [name]'s drinking are not drinking through the [place] with Leah Jones recognizing that episodes of drinking in the past were shared with numerous family members through group text.  [name] describes her emotions as anticipating a good future and frustrated that she has been having car problems.  She says that she is going to get a rental car immediately after today's group so that she will no longer be without transportation while her car is being  repaired.    Progress Towards Goals: [name] reports no change in her sobriety date.   UDS collected: Yes Results: Yes, positive for ETOH   AA/NA attended?:  Yes   Sponsor?:  Yes     Elsie Maier, MA, LCSW, [facility], LCAS [name], LMFT, LCAS 03/08/2024

## 2024-03-10 ENCOUNTER — Ambulatory Visit (INDEPENDENT_AMBULATORY_CARE_PROVIDER_SITE_OTHER)

## 2024-03-10 DIAGNOSIS — F1020 Alcohol dependence, uncomplicated: Secondary | ICD-10-CM | POA: Diagnosis not present

## 2024-03-10 DIAGNOSIS — F1994 Other psychoactive substance use, unspecified with psychoactive substance-induced mood disorder: Secondary | ICD-10-CM
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
## 2024-03-11 ENCOUNTER — Encounter: Payer: Self-pay | Admitting: Medical

## 2024-03-11 LAB — TOXICOLOGY SCREEN, URINE: Creatinine, POC: 34 mg/dL

## 2024-03-12 ENCOUNTER — Ambulatory Visit (INDEPENDENT_AMBULATORY_CARE_PROVIDER_SITE_OTHER): Admitting: Licensed Clinical Social Worker

## 2024-03-12 DIAGNOSIS — F1020 Alcohol dependence, uncomplicated: Secondary | ICD-10-CM | POA: Diagnosis not present

## 2024-03-12 DIAGNOSIS — F1994 Other psychoactive substance use, unspecified with psychoactive substance-induced mood disorder: Secondary | ICD-10-CM

## 2024-03-12 NOTE — Progress Notes (Signed)
 Daily Group Progress Note   Program: [facility] IOP     Group Time: 9 a.m. to 12 p.m.      Type of Therapy: Process and Psychoeducational    Topic: The therapists check in with group members, assess for SI/HI/psychosis and overall level of functioning. The therapists inquire about sobriety date and number of community support meetings attended since last session.   The therapists present the 2nd half of the video on Breaking the Addiction Cycle emphasizing that people in recovery can do nothing about preoccupation; however, they are able to change their behavior via understanding and then interrupting their using ritual. The therapists give group members the homework of being prepared to detail their using rituals on Monday.  The therapists address the issue of recovery drift and how to be aware of it and to avoid it.  The therapists encouraged group members to prioritize their recovery and not say that they will try to get to a meeting but plan the meetings that they will be attending.  The therapists address the topic of emotions and encounter belief that there are such a thing as bad emotions are unhealthy emotions.  The therapist explain that all emotions are useful and serve as indicators in helping people to navigate life circumstances.   Summary: Leah Jones presents today rating her depression as a 3 and her anxiety as a 4.  Describes her mood as being hopeful and peaceful.  She says that she is staying at a friend's house and housesitting for the friend's cat and is excited that she will have a lot of movie channels to watch and use of this friend's car as she turned in her rental car yesterday.  She says that she is going to a meeting tonight but has not been having regular check-in's with her sponsor.  She does say that she needs to contact her sponsor.  The therapists strongly encouraged Leah Jones to talk to her sponsor about setting up a process of doing a daily check-in given that she is in  very early recovery and thus at high risk of relapse.  Leah Jones again had difficulty making out the audio for today's video but expresses gratitude that the therapist was able to explain it clearly enough that she was able to comprehend today's material.   Progress Towards Goals: Leah Jones reports no change in her sobriety date.  UDS collected: No Results: Yes, negative for drugs and alcohol  except for nicotine .   AA/NA attended?:  No   Sponsor?:  Yes   Elsie Maier, MA, LCSW, [facility], LCAS [name], [place], LCAS 03/12/2024

## 2024-03-15 ENCOUNTER — Ambulatory Visit (HOSPITAL_COMMUNITY)

## 2024-03-15 ENCOUNTER — Telehealth (HOSPITAL_COMMUNITY): Payer: Self-pay | Admitting: Licensed Clinical Social Worker

## 2024-03-15 NOTE — Telephone Encounter (Signed)
 Leah Jones leaves a voicemail saying that she will not be in group today as she has to see her arm doctor today at 10 AM as she believes that she might have reinjured it.  [name], MA, LCSW, [facility], LCAS 03/15/2024

## 2024-03-16 DIAGNOSIS — S52021D Displaced fracture of olecranon process without intraarticular extension of right ulna, subsequent encounter for closed fracture with routine healing: Secondary | ICD-10-CM | POA: Diagnosis not present

## 2024-03-16 DIAGNOSIS — S52021S Displaced fracture of olecranon process without intraarticular extension of right ulna, sequela: Secondary | ICD-10-CM | POA: Diagnosis not present

## 2024-03-17 ENCOUNTER — Ambulatory Visit (INDEPENDENT_AMBULATORY_CARE_PROVIDER_SITE_OTHER)

## 2024-03-17 DIAGNOSIS — F1994 Other psychoactive substance use, unspecified with psychoactive substance-induced mood disorder: Secondary | ICD-10-CM

## 2024-03-17 DIAGNOSIS — F1020 Alcohol dependence, uncomplicated: Secondary | ICD-10-CM
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
## 2024-03-19 ENCOUNTER — Ambulatory Visit (HOSPITAL_COMMUNITY)

## 2024-03-19 ENCOUNTER — Telehealth (HOSPITAL_COMMUNITY): Payer: Self-pay

## 2024-03-19 NOTE — Telephone Encounter (Signed)
 Leah Jones calls this morning and leaves a message. She says that she just opened her eyes. She says she set her alarm but never heard it and is not sure what happened. Leah Jones says she plans to be in [facility] on this coming Monday.  Therapist calls [name] back to let her know there would be no [facility] IOP on Monday.  [name], MS, LMFT, LCAS

## 2024-03-24 ENCOUNTER — Ambulatory Visit (INDEPENDENT_AMBULATORY_CARE_PROVIDER_SITE_OTHER): Admitting: Licensed Clinical Social Worker

## 2024-03-24 DIAGNOSIS — F1020 Alcohol dependence, uncomplicated: Secondary | ICD-10-CM | POA: Diagnosis not present

## 2024-03-24 DIAGNOSIS — F1994 Other psychoactive substance use, unspecified with psychoactive substance-induced mood disorder: Secondary | ICD-10-CM

## 2024-03-24 NOTE — Progress Notes (Signed)
 Program: [facility] IOP     Group Time: 9 a.m. to 12 p.m.      Type of Therapy: Process and Psychoeducational    Topic: The therapists check in with group members, assess for SI/HI/psychosis and overall level of functioning. The therapists inquire about sobriety date and number of community support meetings attended since last session.   The therapists introduce a new group member and challenge group members to answer questions concerning the one major thing which would differentiate an alcoholic from a social drinker and the reason that willpower is ineffective in relation to being able to drink or use in a controlled fashion. The therapists talk about what is meant by a reservation and suggest that many people's reservation involves the death of someone important in their lives. The therapists suggest that group members can talk with people in long-term recovery and find out how they dealt with death and dying without using substances. The therapists talk about the fact that active addiction is a barrier to having a healthy relationships with others.     Summary: Leah Jones presents today rating her depression and anxiety both as a 5.  She says that she has attended a couple of women's only meetings which she likes a great deal.  She says that she does not chair much in meetings but that she does not listen.  Leah Jones indicates that she has a new sobriety date as she drank a White Claw 4 to 5 days ago.  She says that she went to AA she needs gas station to get gas and went in to the store having the thought that she could have just 1 before heading home.  She admits that she was feeling frustrated as a result of her car problems and says that she found out that 2 people she knows died that day.  Her son confronted her about drinking an alcoholic beverage and hide he admits lying to him about this while at the same time saying that he knows the truth.  She did call her sponsor after she drank letting her know  what happened.  In spite of this setback, she still describes her mood as hopeful and good otherwise.  After receiving feedback from other group members and the therapists, she recognizes that she could try and avoid convenience stores when getting gas opting for places in which the attendant is in a booth.  Also, if she does go to a convenience store to get gas, she recognizes that she could just use her debit card at the pump and not go into the store it self.  Lastly, she could play the story forward.  The therapist encourages Leah Jones to let her son know that he was correct regarding her having taken a drink and to apologize for having lied to him while letting him know that the situation has been handled.  Progress Towards Goals: Leah Jones admits to drinking 4-5 days ago.   UDS collected: Yes Results: No   AA/NA attended?:  Yes   Sponsor?:  Yes     Elsie Maier, MA, LCSW, [facility], LCAS [name], LMFT, LCAS 03/24/2024

## 2024-03-25 DIAGNOSIS — L578 Other skin changes due to chronic exposure to nonionizing radiation: Secondary | ICD-10-CM | POA: Diagnosis not present

## 2024-03-25 DIAGNOSIS — M720 Palmar fascial fibromatosis [Dupuytren]: Secondary | ICD-10-CM | POA: Diagnosis not present

## 2024-03-25 DIAGNOSIS — L570 Actinic keratosis: Secondary | ICD-10-CM | POA: Diagnosis not present

## 2024-03-26 ENCOUNTER — Ambulatory Visit (INDEPENDENT_AMBULATORY_CARE_PROVIDER_SITE_OTHER): Admitting: Licensed Clinical Social Worker

## 2024-03-26 ENCOUNTER — Ambulatory Visit (HOSPITAL_BASED_OUTPATIENT_CLINIC_OR_DEPARTMENT_OTHER): Admitting: Cardiology

## 2024-03-26 DIAGNOSIS — F1020 Alcohol dependence, uncomplicated: Secondary | ICD-10-CM | POA: Diagnosis not present

## 2024-03-26 DIAGNOSIS — F1994 Other psychoactive substance use, unspecified with psychoactive substance-induced mood disorder: Secondary | ICD-10-CM
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
## 2024-03-29 ENCOUNTER — Ambulatory Visit: Admitting: Cardiology

## 2024-03-29 ENCOUNTER — Telehealth (HOSPITAL_COMMUNITY): Payer: Self-pay | Admitting: Licensed Clinical Social Worker

## 2024-03-29 NOTE — Telephone Encounter (Signed)
 The therapist receives a call from [facility] saying that she will not be in IOP today as she will be attending a funeral.  [name], [place], [place], [facility], LCAS 03/29/24

## 2024-03-31 ENCOUNTER — Telehealth (HOSPITAL_COMMUNITY): Payer: Self-pay | Admitting: Licensed Clinical Social Worker

## 2024-03-31 NOTE — Telephone Encounter (Signed)
 The therapist attempts to reach [facility] after she no shows for group leaving her a HIPAA-compliant voicemail.  [name], MA, LCSW, [facility], LCAS 03/31/2024

## 2024-04-02 ENCOUNTER — Telehealth (HOSPITAL_COMMUNITY): Payer: Self-pay | Admitting: Licensed Clinical Social Worker

## 2024-04-02 NOTE — Telephone Encounter (Signed)
 Leah Jones leaves a voicemail saying that she missed group today as she has spent the past couple of days helping a friend who is in crisis due to back problems.  She says that she would have call sooner but was unable to find the number to call this therapist.  She says that she hopes to be back in group on Monday.  [name], MA, LCSW, [facility], LCAS 04/02/2024

## 2024-04-05 ENCOUNTER — Ambulatory Visit (HOSPITAL_COMMUNITY): Admitting: Licensed Clinical Social Worker

## 2024-04-05 DIAGNOSIS — F1020 Alcohol dependence, uncomplicated: Secondary | ICD-10-CM | POA: Diagnosis not present

## 2024-04-05 DIAGNOSIS — F1994 Other psychoactive substance use, unspecified with psychoactive substance-induced mood disorder: Secondary | ICD-10-CM

## 2024-04-05 NOTE — Progress Notes (Signed)
 Daily Group Progress Note   Program: [facility] IOP     Group Time: 9 a.m. to 12 p.m.      Type of Therapy: Process and Psychoeducational    Topic: The therapists check in with group members, assess for SI/HI/psychosis and overall level of functioning. The therapists inquire about sobriety date and number of community support meetings attended since last session.   The therapists make the observation that group members who fail to attend IOP on a consistent basis and those who do not attend regular sober support meetings tend to relapse at a much higher rate than those who attend consistently.  The therapist frame a relapse or a slip as an opportunity for learning and suggest that people must move from being problem focused towards being solution focused.  The therapists explained that those in early recovery are not in a position to be trying to help others and must be selfish in their recovery.  The therapists talk about what is meant by the phrase easy does it and also why the program encourages people to not compare their insides to other people's outsides.  The main thing that is stressed today is the need for people in recovery to not isolate and tried to navigate life problems on their own but rather to reach out to others to obtain feedback and support.  The therapists provide a list of character defects, resentments, and fears for group members to review explaining that those who are on step 1 can use this material when they eventually get to working step 4.    Summary: Leah Jones presents today rating her depression as a 5 and her anxiety as a 6.  She apologizes for missing all of last week.  She says that she did attend a couple of meetings last week going to the new meeting at her church on Wednesday night.  She plans on going to a church retreat this weekend saying that she will not be in group Friday as a result of this.  Because she will be attending this retreat and because the woman she has  been helping out with back pain, she concludes that she will be free this week.  Leah Jones says that the woman she was helping is a person she met in recovery at [facility].  She says that that this woman has no other friends and admits that the woman is still actively drinking.  Leah Jones also discloses that she went to the woman's house this Sunday and drank a white claw while she was there as she felt stressed out.  She is yet to discuss this relapse with her sponsor.  The therapist informs Leah Jones that she has only attended 2 of her last 6 IOP groups.  The therapist also observes that Leah Jones has a tendency to focus on trying to help others at the expense of her own recovery.  Another group member informs Leah Jones Jones that she is not at the point where she has any business trying to do service work.  The therapist and other group members counter Leah Jones's belief that this woman has no one else pointing out that this woman has a husband and has health insurance such that she could have gotten home health to address the same issues that Leah Jones was handling.  Leah Jones recognizes that she is going to have to learn to say no to people and get over her tendency to try and be a people pleaser.  She is encouraged to avoid interacting  with people who are active in addiction.   Progress Towards Goals: Leah Jones admits to drinking a white claw yesterday.  UDS collected: Yes Results: Yes, positive for alcohol .  AA/NA attended?:  Yes   Sponsor?:  Yes     Elsie Maier, MA, LCSW, [facility], LCAS [name], LMFT, LCAS 04/05/2024

## 2024-04-07 ENCOUNTER — Encounter (HOSPITAL_COMMUNITY): Payer: Self-pay | Admitting: Medical

## 2024-04-07 ENCOUNTER — Ambulatory Visit (INDEPENDENT_AMBULATORY_CARE_PROVIDER_SITE_OTHER): Admitting: Medical

## 2024-04-07 DIAGNOSIS — F1994 Other psychoactive substance use, unspecified with psychoactive substance-induced mood disorder: Secondary | ICD-10-CM

## 2024-04-07 DIAGNOSIS — F1020 Alcohol dependence, uncomplicated: Secondary | ICD-10-CM

## 2024-04-07 DIAGNOSIS — S8291XG Unspecified fracture of right lower leg, subsequent encounter for closed fracture with delayed healing: Secondary | ICD-10-CM

## 2024-04-07 DIAGNOSIS — R296 Repeated falls: Secondary | ICD-10-CM

## 2024-04-07 DIAGNOSIS — E559 Vitamin D deficiency, unspecified: Secondary | ICD-10-CM

## 2024-04-07 DIAGNOSIS — S42301G Unspecified fracture of shaft of humerus, right arm, subsequent encounter for fracture with delayed healing: Secondary | ICD-10-CM

## 2024-04-07 DIAGNOSIS — E538 Deficiency of other specified B group vitamins: Secondary | ICD-10-CM

## 2024-04-07 NOTE — Progress Notes (Signed)
 Daily Group Progress Note   Program: CD IOP     Group Time: 9 a.m. to 12 p.m.      Type of Therapy: Process and Psychoeducational    Topic: The therapists check in with group members, assess for SI/HI/psychosis and overall level of functioning. The therapists inquire about sobriety date and number of community support meetings attended since last session.   The therapists share the 11 steps of relapse as presented by [facility]. These 11 steps are as follows:  1. Unhealthy Emotions, 2. Denial, 3. Compulsive Behaviors, 4. Triggers, 5. [employer], 6.External Chaos, 7. Loss of Control, 8. Addictive Thinking, 9. High Risk Situations, 10, Relapse (physical), 11. Aftermath of relapse. Therapists compare how the 11 steps are details of the 3 steps of relapse which Elspeth Railing, PhD which include emotional, mental and physical. Therapists asks group members to identify where they see themselves in this framework and discuss it.   Summary: Milda presents today rating her depression as a 5 and her anxiety as a 6.  She says she has not attended a meeting since last IOP. Mashelle does have a sponsor, however she says she has not told her sponsor about her slip, but she plans to do that.  Shadee says the way she has fun is by being productive. She also says she has fun by attending church, seeing family and friends.  She says she is in the process of of re-discovery herself. Marlana says she wants to learn how to play pickle ball.  Yadhira says she used to go bowling but has not in a long time.  She says she would like to get started again.  Leiann shares that her friends drink, but just not around her.  Reet says she feels she is engaging in self care.  Charlyn says she did not pick up another white chip because she was embarrassed she drank again.  Ignacia says her take away is that she is glad no one was taking pictures of her back in the day when she was in active addiction. She says  it is  important to be aware of where you are. Ginamarie says she learned what the difference between a slip/lapse and a relapse.   Progress Towards Goals: 04-04-24 is Maidie's sobriety date.  UDS collected: No Results: none  AA/NA attended?:  no   Sponsor?:  Yes   [name], MS, LMFT, LCAS  [address] [place], [place], [facility], LCAS  04/07/2024

## 2024-04-07 NOTE — Progress Notes (Signed)
 Error

## 2024-04-08 ENCOUNTER — Encounter (HOSPITAL_BASED_OUTPATIENT_CLINIC_OR_DEPARTMENT_OTHER): Payer: Self-pay

## 2024-04-09 ENCOUNTER — Ambulatory Visit (HOSPITAL_COMMUNITY)

## 2024-04-09 LAB — TOXICOLOGY SCREEN, URINE: Creatinine, POC: 40 mg/dL

## 2024-04-12 ENCOUNTER — Encounter (HOSPITAL_COMMUNITY): Payer: Self-pay | Admitting: Medical

## 2024-04-12 ENCOUNTER — Ambulatory Visit (INDEPENDENT_AMBULATORY_CARE_PROVIDER_SITE_OTHER): Admitting: Medical

## 2024-04-12 DIAGNOSIS — I10 Essential (primary) hypertension: Secondary | ICD-10-CM

## 2024-04-12 DIAGNOSIS — F1020 Alcohol dependence, uncomplicated: Secondary | ICD-10-CM

## 2024-04-12 DIAGNOSIS — J301 Allergic rhinitis due to pollen: Secondary | ICD-10-CM

## 2024-04-12 DIAGNOSIS — R748 Abnormal levels of other serum enzymes: Secondary | ICD-10-CM

## 2024-04-12 DIAGNOSIS — Z91038 Other insect allergy status: Secondary | ICD-10-CM

## 2024-04-12 DIAGNOSIS — E538 Deficiency of other specified B group vitamins: Secondary | ICD-10-CM

## 2024-04-12 DIAGNOSIS — F1994 Other psychoactive substance use, unspecified with psychoactive substance-induced mood disorder: Secondary | ICD-10-CM

## 2024-04-12 DIAGNOSIS — E559 Vitamin D deficiency, unspecified: Secondary | ICD-10-CM

## 2024-04-12 DIAGNOSIS — G621 Alcoholic polyneuropathy: Secondary | ICD-10-CM

## 2024-04-12 DIAGNOSIS — Z8659 Personal history of other mental and behavioral disorders: Secondary | ICD-10-CM

## 2024-04-12 DIAGNOSIS — R296 Repeated falls: Secondary | ICD-10-CM

## 2024-04-12 DIAGNOSIS — S42301G Unspecified fracture of shaft of humerus, right arm, subsequent encounter for fracture with delayed healing: Secondary | ICD-10-CM

## 2024-04-12 NOTE — Progress Notes (Signed)
 Daily Group Progress Note   Program: CD IOP     Group Time: 9 a.m. to 12 p.m.      Type of Therapy: Process and Psychoeducational    Topic: The therapists check in with group members, assess for SI/HI/psychosis and overall level of functioning. The therapists inquire about sobriety date and number of community support meetings attended since last session.   The therapists discuss the fact that research establishes that recovery takes place in fellowship and that this means building a sober support system. Additionally, research establishes that continuing to associate with substance-using friends increases ambivalence about whether to stop or not which in turn translates into greater risk of relapse. The therapists suggest that people can find such sober support in programs such as AA or NA and those who are turned off by talk of God can attend a program such as Smart Recovery. Additionally, those who want faith-based programs that are Christian-oriented could attend a program such as Celebrate Recovery.  The therapists suggest that people in early recovery make poor decisions due to impairment in their prefrontal cortex with people with long-term recovery serving as an auxiliary pre-frontal cortex until their's is functioning normally.   The therapists explain that the SA IOP presents information primarily from a science-based perspective which is not always the case in non-professionally led self-help programs. The therapist observes that the Big Book of AA indicates that they do not necessarily know the reason some people cannot stop drinking while others can; however, this is outdated as we now know the answer to this question. The therapists share an abbreviated definition of the ASAM definition of addiction to illustrate this.  The therapists also talk about why tobacco has been systematically ignored by the substance abuse treatment field even though it kills more people than all other  substances combined. Further, continued tobacco use has negative effects on long-term recovery from drugs and alcohol . The therapists inform group members that Zell ORN. dies as a result of his tobacco use.    Summary: Leah Jones presents today rating her depression as a 61 and her anxiety is a 5.  She went on her religious retreat this weekend which was attended all weekend by her sponsor as well.  Leah Jones says that she was not sitting at the same table as her sponsor so has yet to tell her sponsor about her recent slip nor has [name] been back to a meeting to pick up a white chip.  She has encouraged to make her sponsor aware of her recent slip and to pick up a start of her chip at the next meeting that she attends.  She says that her 43 year old brother died unexpectedly last 61-07-25.  When the new group member introduced himself and told his story, [name] he was very inquisitive of him.  The therapist continues to challenge [name] to consider how her continued substance use is consistent with the values of her faith which is clearly very important to her.   Progress Towards Goals: [name] reports no change in her sobriety date.  UDS collected: Yes Results: Yes, positive for alcohol  metabolites.  AA/NA attended?:  No   Sponsor?:  Yes   Elsie Maier, MA, LCSW, [facility], LCAS [name], [place], LCAS 04/12/2024

## 2024-04-12 NOTE — Progress Notes (Addendum)
 [facility] Follow-up Outpatient CDIOP Date: 04/12/2024  Admission Date:02/11/2024  Sobriety date:04/04/2024  Subjective: I went to an [place] walk retreat (with 60 women) iT WAS THE MOST AMAZING THING.  HPI : CD IOP Provider FU Leah Jones is now almost 60 days in CD IOP and has not had a stable sobriety date partly due to the fact that she has only attended 2 of her last 6 IOP groups that Shamicka has a tendency to focus on trying to help others at the expense of her own recovery.. Today she is enthused about her religious retreat experience. Her brother who suffered from addiction was found dead in his home [place] last 05-04-24 but she put her need for a spiritual retreat ahead of going to [facility] . She describes her brother's trials and travels to some extent ,describing him as brilliant but lost to his addiction(s). She reports that she still has to admit her new sobriety date to her sponsor.She has strated taking her Baclofen  3 x/day without any problems.  Counselor's report: Summary: Leah Jones presents today rating her depression as a 4 and her anxiety is a 5.  She went on her religious retreat this weekend which was attended all weekend by her sponsor as well.  Leah Jones says that she was not sitting at the same table as her sponsor so has yet to tell her sponsor about her recent slip nor has Leah Jones been back to a meeting to pick up a white chip.  She has encouraged to make her sponsor aware of her recent slip and to pick up a start of her chip at the next meeting that she attends.   She says that her 63 year old brother died unexpectedly last 05/04/24.  When the new group member introduced himself and told his story, Leah Jones he was very inquisitive of him.   The therapist continues to challenge Leah Jones to consider how her continued substance use is consistent with the values of her faith which is clearly very important to her.   Review of Systems: Psychiatric: Agitation:  See Counselor report Hallucination: No Depressed Mood: see Counselor reports Insomnia: No Hypersomnia: No Altered Concentration: No Feels Worthless: Denies Grandiose Ideas: No Belief In Special Powers: No New/Increased Substance Abuse: Ongoing slips Compulsions: Alcohol  use  Neurologic: Headache: No Seizure: No Paresthesias: No  Current Medications:  Your Medication List amLODipine  10 MG tablet Commonly known as: NORVASC  Take 1 tablet (10 mg total) by mouth daily.  aspirin EC 81 MG tablet Take 81 mg by mouth.  atomoxetine  40 MG capsule Commonly known as: STRATTERA  Take 40 mg by mouth every morning.  b complex vitamins capsule Take by mouth.  baclofen  10 MG tablet Commonly known as: LIORESAL  Take 1 tablet (10 mg total) by mouth 3 (three) times daily.  cetirizine  10 MG tablet Commonly known as: ZYRTEC  Take 1 tablet (10 mg total) by mouth at bedtime.  DULoxetine  60 MG capsule Commonly known as: Cymbalta  Take 1 capsule (60 mg total) by mouth daily.  EPINEPHrine  0.3 mg/0.3 mL Soaj injection Commonly known as: EPI-PEN Inject 0.3 mg into the muscle as needed for anaphylaxis.  estradiol  1 MG tablet Commonly known as: ESTRACE  Take 1 mg by mouth daily.  fluticasone  50 MCG/ACT nasal spray Commonly known as: FLONASE  1 spray daily.  folic acid  1 MG tablet Commonly known as: FOLVITE  Take 1 tablet (1 mg total) by mouth daily.  gabapentin  300 MG capsule Commonly known as: Neurontin  Take 1 capsule (300 mg total) by  mouth 3 (three) times daily.  guanFACINE  1 MG tablet Commonly known as: TENEX  Take 1 tablet (1 mg total) by mouth at bedtime.  medroxyPROGESTERone 2.5 MG tablet Commonly known as: PROVERA Take 2.5 mg by mouth daily.  Melatonin 3 MG Subl Take 1 tablet by mouth at bedtime as needed.  naltrexone  50 MG tablet Commonly known as: DEPADE Take 2 tablets (100 mg total) by mouth at bedtime.  propranolol  ER 120 MG 24 hr capsule Commonly known as: INDERAL  LA Take 1 capsule (120 mg  total) by mouth daily.  Vitamin D  (Ergocalciferol ) 1.25 MG (50000 UNIT) Caps capsule Commonly known as: DRISDOL  Take 1 capsule (50,000 Units total) by mouth every 7 (seven) days.    Mental Status Examination  Appearance:Casual Alert: Yes Attention: good  Cooperative: Yes Eye Contact: Good Speech: Clear and coherent, rate WNL Psychomotor Activity: Normal Memory:Negative Except for blackouts Concentration/Attention: Normal/intact Oriented: person, place, time/date and situation Mood: Euthymic/ some anxiety Affect: Appropriate and Congruent Thought Processes and Associations: Coherent and Intact Fund of Knowledge:WDL Thought Content: WDL Logical NoSI/HI Associations intact Insight:Limited Judgement:Impaired  UDS:03/24/2024 + Alcohol   PDMP: Filled  Written  Sold  ID  Drug  QTY  Days  Prescriber  RX #  Dispenser  Refill  Daily Dose*  Pymt Type  PMP   02/06/2024 02/06/2024  1 Gabapentin  300 Mg Capsule 90.00 30 Sh Joh          Diagnosis:  Alcohol  use disorder, severe, dependence (HCC) Substance induced mood disorder (HCC) Multiple falls Multiple closed fractures of right upper and lower extremity with delayed healing Vitamin D  deficiency B12 deficiency Alcoholic peripheral neuropathy Primary hypertension Seasonal allergic rhinitis due to pollen Hymenoptera allergy History of ADHD Elevated liver enzy  Assessment: Unable to abstain  Treatment Plan: Per admission/Counselors [name], PA-CPatient ID: Leah Jones, female   DOB: 1961-05-14, 63 y.o.   MRN: [id number]

## 2024-04-12 NOTE — Addendum Note (Signed)
 Addended by: LEILA CARLIN BRAVO on: 04/12/2024 04:03 PM   Modules accepted: Level of Service

## 2024-04-14 ENCOUNTER — Ambulatory Visit (INDEPENDENT_AMBULATORY_CARE_PROVIDER_SITE_OTHER)

## 2024-04-14 DIAGNOSIS — F1020 Alcohol dependence, uncomplicated: Secondary | ICD-10-CM | POA: Diagnosis not present

## 2024-04-14 DIAGNOSIS — F1994 Other psychoactive substance use, unspecified with psychoactive substance-induced mood disorder: Secondary | ICD-10-CM

## 2024-04-14 LAB — TOXICOLOGY SCREEN, URINE: Creatinine, POC: 63 mg/dL

## 2024-04-14 NOTE — Progress Notes (Signed)
 Daily Group Progress Note   Program: CD IOP     Group Time: 9 a.m. to 12 p.m.      Type of Therapy: Process and Psychoeducational    Topic: The therapists check in with group members, assess for SI/HI/psychosis and overall level of functioning. The therapists inquire about sobriety date and number of community support meetings attended since last session.    Therapists show the [employer], PHD video on Relapse Prevention and discuss the emotional, mental and physical states of relapse.  Therapists discussed the Cognitive Therapy and Relapse Prevention issues of  negative thinking patterns, fear and fun  in the [employer] article, "Relapse Prevention and the Five Rules of Recovery".  The following list of types of negative thinking that are obstacles to recovery: 1) My problem is because of other people 2) I don't think I can handle life without using, 3) Maybe I can just use occasionally, 4) Life won't be fun without using, 5) I'm worried I will turn into someone I don't like, 6) I can't take all the necessary changes, I can't change my friends, 7) I don't want to abandon my family, 8) Recovery is too much work, 9) My cravings will be overwhelming. I won't be able to resist them, 10) If I stop I'll only start up again, I have never finished anything, 11) No one has to know if I relapse and 12) I'm worried I have been so damaged by m addiction that I won't be able to recovery.  Therapist prompted group members to engage in conversation on how topics relate to their own struggles with recovery  Progress Towards Goals: Leah Jones rates her depression as a 5 and her anxiety as a 6.  Leah Jones reports no change in her sobriety date. She has not attended a meeting since last group. She says her sponsor was at the [employer] at her church last weekend, but they did not discuss recovery, rather this was a Freight forwarder.  Leah Jones is unsure of the last time she and her sponsor discussed recovery.  Therapists explore where the heirarchy of her life, as they feel that recovery is not at the top of her list. Leah Jones agrees and says the obstacles getting in her way of putting recovery first is her desire to please other people so she puts other's needs ahead of her own. Therapists discuss the importance of having a sponsor that does discuss the importance of recovery and advise Leah Jones to explore getting a sponsor that is able to focus on recovery and hopefully this sponsor could assist in helping her get out of wanting to please others first and and learning to organizing her life around recovery, rather than the other way around.  UDS collected: no Results: none  AA/NA attended?:  No   Sponsor?:  Yes  Leah Jones Simpler, MS, LMFT, LCAS  [address], [place], [place], [facility], LCAS  04/14/2024

## 2024-04-16 ENCOUNTER — Ambulatory Visit (HOSPITAL_COMMUNITY)

## 2024-04-16 DIAGNOSIS — F1020 Alcohol dependence, uncomplicated: Secondary | ICD-10-CM | POA: Diagnosis not present

## 2024-04-16 DIAGNOSIS — F1994 Other psychoactive substance use, unspecified with psychoactive substance-induced mood disorder: Secondary | ICD-10-CM

## 2024-04-16 NOTE — Progress Notes (Signed)
 Daily Group Progress Note   Program: CD IOP     Group Time: 9 a.m. to 12 p.m.      Type of Therapy: Process and Psychoeducational    Topic: The therapists check in with group members, assess for SI/HI/psychosis and overall level of functioning. The therapists inquire about sobriety date and number of community support meetings attended since last session.    Today, therapists discuss the following issues today, prompting discussion on the issues:  barriers to recovery,  how it is that longer someone is in recovery starts to notice difficulty in relating to people in active addiction, being smart in recovery, than than trying to be stronger (disease vs. Character disorder), role of sober support and social connectedness in recovery, how to implement the tenant of service in 12 step meeting. Therapists show PET scans of the brain when normal, in active use of cocaine and after 14 months of recovery and answered questions group members regarding addiction being a brain disease.  Progress Towards Goals: Leah Jones rates her depression as a 5 and her anxiety as a 5. She reports the same sobriety date. She says she is attending meetings. Leah Jones says she she talked to her sponsor on Wednesday and told her she needs more help than what she is currently receiving from her. She says her sponsor apologized and request that Leah Jones call her every other day or if she feels like using.  Leah Jones says she picked up a white chip during the last meeting. Leah Jones says she is going to start working on step 3.  UDS collected: no Results: none  AA/NA attended?:  No   Sponsor?:  Yes  [name], MS, LMFT, LCAS  [address], [place], [place], [facility], LCAS  04/16/2024

## 2024-04-16 NOTE — Progress Notes (Signed)
 Opened in error

## 2024-04-19 ENCOUNTER — Other Ambulatory Visit: Payer: Self-pay | Admitting: Family Medicine

## 2024-04-19 ENCOUNTER — Other Ambulatory Visit: Payer: Self-pay

## 2024-04-19 ENCOUNTER — Ambulatory Visit (HOSPITAL_COMMUNITY)

## 2024-04-19 ENCOUNTER — Telehealth (HOSPITAL_COMMUNITY): Payer: Self-pay | Admitting: Licensed Clinical Social Worker

## 2024-04-19 NOTE — Telephone Encounter (Signed)
 The therapist attempts to reach Leah Jones in response to her no showing for group leaving a HIPAA-compliant voicemail.  [name], MA, LCSW, [facility], LCAS 04/19/2024

## 2024-04-19 NOTE — Telephone Encounter (Signed)
 Copied from CRM #[id number]. Topic: Clinical - Medication Refill >> Apr 19, 2024 12:21 PM Shereese L wrote: Medication:propranolol  ER (INDERAL  LA) 120 MG 24 hr capsule  Has the patient contacted their pharmacy? Yes (Agent: If no, request that the patient contact the pharmacy for the refill. If patient does not wish to contact the pharmacy document the reason why and proceed with request.) (Agent: If yes, when and what did the pharmacy advise?)  This is the patient's preferred pharmacy:  [facility] [id number] - [place], [place] - [address] [place] [address] Phone: [phone] Fax: [phone]  Is this the correct pharmacy for this prescription? Yes If no, delete pharmacy and type the correct one.   Has the prescription been filled recently? Yes  Is the patient out of the medication? Yes  Has the patient been seen for an appointment in the last year OR does the patient have an upcoming appointment? Yes  Can we respond through MyChart? Yes  Agent: Please be advised that Rx refills may take up to 3 business days. We ask that you follow-up with your pharmacy.

## 2024-04-21 ENCOUNTER — Telehealth (HOSPITAL_COMMUNITY): Payer: Self-pay

## 2024-04-21 ENCOUNTER — Ambulatory Visit (HOSPITAL_COMMUNITY)

## 2024-04-21 NOTE — Telephone Encounter (Signed)
 Therapist receives a message at 6:24 am this morning from [place]. She says she missed on Monday due to a stomach virus with fever.  She says her fever has gone some but she needs to sleep today.  She says she will return on Friday. She apologizes for not calling on Monday to let us  know of her absence.  [name], MS, LMFT, LCAS

## 2024-04-22 ENCOUNTER — Other Ambulatory Visit: Payer: Self-pay | Admitting: Oncology

## 2024-04-22 ENCOUNTER — Encounter: Payer: Self-pay | Admitting: Medical

## 2024-04-22 DIAGNOSIS — R790 Abnormal level of blood mineral: Secondary | ICD-10-CM

## 2024-04-23 ENCOUNTER — Ambulatory Visit: Admitting: Family Medicine

## 2024-04-23 ENCOUNTER — Ambulatory Visit (HOSPITAL_COMMUNITY): Admitting: Licensed Clinical Social Worker

## 2024-04-23 DIAGNOSIS — F1020 Alcohol dependence, uncomplicated: Secondary | ICD-10-CM

## 2024-04-23 DIAGNOSIS — F1994 Other psychoactive substance use, unspecified with psychoactive substance-induced mood disorder: Secondary | ICD-10-CM

## 2024-04-23 DIAGNOSIS — Z91199 Patient's noncompliance with other medical treatment and regimen due to unspecified reason: Secondary | ICD-10-CM

## 2024-04-23 NOTE — Progress Notes (Signed)
 No show

## 2024-04-23 NOTE — Progress Notes (Signed)
 Daily Group Progress Note   Program: CD IOP     Group Time: 9 a.m. to 12 p.m.      Type of Therapy: Process and Psychoeducational    Topic: The therapist checks in with group members, assesses for SI/HI/psychosis and overall level of functioning. The therapist inquires about sobriety date and number of community support meetings attended since last session.   The therapist facilitates group discussions on a number of topics such as self will and the need to prioritize one's addiction recovery given that this disease is one that can prove to be fatal for many.  The therapist talks about the importance of scheduling and it deserves that when the person uses the word try that here she is typically not likely to end up following through.  The therapist talks about the fact that using drugs and alcohol  when a person is wait-and-see age or an adolescent will hardwire this pattern in the person's brain indefinitely with stress being a factor that can activate it even though it has been dormant for some time.  The therapist makes the observation that sometimes what an individual may be going through could end up being an inspiration to others.  Lastly, the therapist talks about the history of Alcoholics Anonymous and Narcotics Anonymous explaining that everything they do in the program has a reason and that people must trust that there is a method to their madness.   Summary: Leah Jones returns to group today after being ill with a stomach ailment.  She reports no change in her sobriety date.  He describes her mood as shaky and tired.  He also adds that she feels motivated.  Leah Jones says that he has decided to tell her sponsor that she is probably going to have to look for a new sponsor as Leah Jones says that her current sponsor appears to have too much on her plate such that she would not be able to give Leah Jones the attention that she needs.  She is active in today's group discussions and says that the  analogies that the therapist uses helps her to be able to grasp the concepts more easily.  By the conclusion of group, she says that her biggest take away is that this group gives her incentive.    Progress Towards Goals: Leah Jones reports no change in her sobriety date.   UDS collected: Yes Results: No   AA/NA attended?:  Yes   Sponsor?:  Yes     Leah Maier, MA, LCSW, [facility], LCAS 04/23/2024

## 2024-04-26 ENCOUNTER — Ambulatory Visit: Admitting: Family Medicine

## 2024-04-26 ENCOUNTER — Ambulatory Visit (INDEPENDENT_AMBULATORY_CARE_PROVIDER_SITE_OTHER): Admitting: Licensed Clinical Social Worker

## 2024-04-26 DIAGNOSIS — F1020 Alcohol dependence, uncomplicated: Secondary | ICD-10-CM | POA: Diagnosis not present

## 2024-04-26 DIAGNOSIS — F1994 Other psychoactive substance use, unspecified with psychoactive substance-induced mood disorder: Secondary | ICD-10-CM

## 2024-04-26 NOTE — Progress Notes (Signed)
 Daily Group Progress Note   Program: CD IOP     Group Time: 9 a.m. to 12 p.m.      Type of Therapy: Process and Psychoeducational    Topic: The therapists check in with group members, assess for SI/HI/psychosis and overall level of functioning. The therapists inquire about sobriety date and number of community support meetings attended since last session.   The therapists introduce a new group member and read the NA Just for Today reading from 04/24/24 on the 30 Day Wonder asking group members to discuss what their takeaway is from this and the biological reason that people would be bored in early recovery. The therapists suggest that comparing one's self to others or trying to be special or unique are both products of ego and likely to end only in misery. The therapists suggest that many people like attending meetings and taking UDS as the accountability helps to keep them on track. The therapists cover the importance of learning to be comfortable with being uncomfortable and discuss acceptance and commitment therapy normalizing some amount of anxiety, depression, and anger as a part of life. The therapists talk about the concept of self-forgiveness as it related to Steps 4 and 5 reminding people that they are not who they are in their disease. The therapists explain that people who beat up on themselves for actions when using who claim to understand that addiction is a disease still are not fully grasping that it is a disease. The therapists observe that people do not see the actions of a family member with Alzheimer's or Huntington's Disease as defining who the person is when healthy. Lastly, the therapists discuss the Abstinence Stage of Recovery and the tasks involved in this stage as well as the difference between acute withdrawal and pot-acute withdrawal syndrome.     Summary: Leah Jones presents today rating her depression as a 5 and her anxiety is a 6.  She reports no change in her sobriety date  and says that she attended a meeting over the weekend.  She has not had the chance to speak to her current sponsor about changing but says that she plans on doing so.  She says that she might end up contacting her previous sponsor to see if she was sponsor her once more.  Leah Jones says that her first sponsor was a very responsive but the problem was that Aveleen was the one who was not reaching out to her.  Leah Jones describes her mood as tired and excited.  She says that she was sleeping great but that she only got 3 hours of sleep last night.  She says that she is excited as every day keeps getting better and that she feels a positive energy as it relates to recovery.     Progress Towards Goals: Leah Jones reports no change in his sobriety date.   UDS collected: No Results: No   AA/NA attended?:  Yes   Sponsor?:  Yes   Elsie Maier, MA, LCSW, [facility], LCAS [name], MS, LMFT, LCAS   04/26/2024

## 2024-04-27 ENCOUNTER — Encounter: Payer: Self-pay | Admitting: Family Medicine

## 2024-04-27 ENCOUNTER — Ambulatory Visit: Admitting: Oncology

## 2024-04-27 ENCOUNTER — Other Ambulatory Visit

## 2024-04-27 DIAGNOSIS — S52021D Displaced fracture of olecranon process without intraarticular extension of right ulna, subsequent encounter for closed fracture with routine healing: Secondary | ICD-10-CM | POA: Diagnosis not present

## 2024-04-27 DIAGNOSIS — S52021S Displaced fracture of olecranon process without intraarticular extension of right ulna, sequela: Secondary | ICD-10-CM | POA: Diagnosis not present

## 2024-04-28 ENCOUNTER — Ambulatory Visit (INDEPENDENT_AMBULATORY_CARE_PROVIDER_SITE_OTHER)

## 2024-04-28 ENCOUNTER — Telehealth: Payer: Self-pay

## 2024-04-28 DIAGNOSIS — F1020 Alcohol dependence, uncomplicated: Secondary | ICD-10-CM | POA: Diagnosis not present

## 2024-04-28 DIAGNOSIS — F1994 Other psychoactive substance use, unspecified with psychoactive substance-induced mood disorder: Secondary | ICD-10-CM

## 2024-04-28 NOTE — Telephone Encounter (Signed)
 LVM to inform pt that we have flu shots available and can schedule for flu clinic.

## 2024-04-28 NOTE — Progress Notes (Signed)
 Daily Group Progress Note   Program: [facility]     Group Time: 9 a.m. to 12 p.m.      Type of Therapy: Process and Psychoeducational    Topic: The therapists check in with group members, assess for SI/HI/psychosis and overall level of functioning. The therapists inquire about sobriety date and number of community support meetings attended since last session.   Therapists discuss the following issues today: how the disease of addition lies trying to convince the addict that they can use and it will not hurt them,how to recognize and hear the disease, importance of putting roadblocks in the way of using, how addiction makes a rationalization for one's use, best course of action involves living honestly. Hanging out with people that are conducive to our recovery.   Summary: Leah Jones presents today rating her depression as a 5 and her anxiety is a 7.  She reports the same sobriety date. She says she has not attended a meeting since last group. She still has the same sponsor that she is wishing to change.  She identifies her emotions as anxious and hopeful.  Leah Jones says she is feeling anxious because she is having a difficult time waking up and felt she could not get herself together this morning.   Progress Towards Goals: Leah Jones reports no change in his sobriety date.   UDS collected: Yes Results: No   AA/NA attended?:  no   Sponsor?:  Yes   [name], MS, LMFT, LCAS [address], [place], [place], [facility], LCAS    04/28/2024

## 2024-04-30 ENCOUNTER — Ambulatory Visit (INDEPENDENT_AMBULATORY_CARE_PROVIDER_SITE_OTHER)

## 2024-04-30 ENCOUNTER — Ambulatory Visit (HOSPITAL_COMMUNITY)

## 2024-04-30 DIAGNOSIS — F1020 Alcohol dependence, uncomplicated: Secondary | ICD-10-CM | POA: Diagnosis not present

## 2024-04-30 DIAGNOSIS — F1994 Other psychoactive substance use, unspecified with psychoactive substance-induced mood disorder: Secondary | ICD-10-CM

## 2024-04-30 NOTE — Progress Notes (Signed)
 Daily Group Progress Note   Program: [facility] IOP     Group Time: 9 a.m. to 12 p.m.      Type of Therapy: Process and Psychoeducational    Topic: The therapists check in with group members, assess for SI/HI/psychosis and overall level of functioning. The therapists inquire about sobriety date and number of community support meetings attended since last session.   The therapists discuss the follow issues today: Introduced the new group member, Learning to listen to the voice of addiction and how to respond to it, the need to change people, places and things associated with use, Share statistics that American's consumer 80% of opioids and 70% of suicides involve one or more substances.   Summary: Leah Jones presents today rating her depression as a 5 and her anxiety is a 7.  She reports the same sobriety date. She says she has attended a meeting since last group and still has the same sponsor. Leah Jones says she has not spoken to her sponsor about needing to change to a new sponsor because her sponsor has been overwhelmed with starting a new project at church. Leah Jones plans to have a new sponsor by next Monday. She identifies her emotions as tired and unfocused. She has she slept horribly last night. She is not sure why as she had been sleeping fine until last night.   Progress Towards Goals: Leah Jones reports no change in his sobriety date.   UDS collected: Yes Results: Negative   AA/NA attended?:  no   Sponsor?:  Yes   [name], MS, LMFT, LCAS [address], [place], [place], [facility], LCAS    04/30/2024

## 2024-05-03 ENCOUNTER — Ambulatory Visit (HOSPITAL_COMMUNITY)

## 2024-05-03 ENCOUNTER — Ambulatory Visit (INDEPENDENT_AMBULATORY_CARE_PROVIDER_SITE_OTHER): Admitting: Licensed Clinical Social Worker

## 2024-05-03 DIAGNOSIS — F1020 Alcohol dependence, uncomplicated: Secondary | ICD-10-CM

## 2024-05-03 DIAGNOSIS — F1994 Other psychoactive substance use, unspecified with psychoactive substance-induced mood disorder: Secondary | ICD-10-CM
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
## 2024-05-04 ENCOUNTER — Inpatient Hospital Stay: Admitting: Oncology

## 2024-05-04 ENCOUNTER — Inpatient Hospital Stay: Attending: Oncology

## 2024-05-04 DIAGNOSIS — F1994 Other psychoactive substance use, unspecified with psychoactive substance-induced mood disorder: Secondary | ICD-10-CM | POA: Diagnosis not present

## 2024-05-04 DIAGNOSIS — F1020 Alcohol dependence, uncomplicated: Secondary | ICD-10-CM | POA: Diagnosis not present

## 2024-05-05 ENCOUNTER — Ambulatory Visit (HOSPITAL_COMMUNITY): Admitting: Licensed Clinical Social Worker

## 2024-05-05 ENCOUNTER — Ambulatory Visit (HOSPITAL_COMMUNITY)

## 2024-05-05 ENCOUNTER — Telehealth: Payer: Self-pay | Admitting: Oncology

## 2024-05-05 DIAGNOSIS — F1020 Alcohol dependence, uncomplicated: Secondary | ICD-10-CM

## 2024-05-05 DIAGNOSIS — F1994 Other psychoactive substance use, unspecified with psychoactive substance-induced mood disorder: Secondary | ICD-10-CM

## 2024-05-05 NOTE — Progress Notes (Signed)
 Daily Group Progress Note   Program: [facility] IOP     Group Time: 9 a.m. to 12 p.m.      Type of Therapy: Process and Psychoeducational    Topic: The therapists check in with group members, assess for SI/HI/psychosis and overall level of functioning. The therapists inquire about sobriety date and number of community support meetings attended since last session.   The therapists focused primarily today on external triggers having group members complete the external trigger exercise from the matrix model.  The therapists emphasize the fact that if they hope to establish long-term recovery that they must be willing to change their lives and not interact with persons and active addiction in their personal life in any way, shape, or form.  The therapists also normalize boredom as being part of early recovery as a result of postacute withdrawal syndrome.   Summary: Arisbel presents today rating her depression as a 5 and her anxiety is a 7.  She appears somewhat puzzled during the discussion about needing to avoid people who are in active addiction even if they are family members.  Linzi notes that when she goes to Thanksgiving that her nephews are going to be out in the garage drinking but that all of her other family members know that she is in recovery.  She does concede that at least 1 of these nephews has an obvious alcohol  problem adding that his father died from a substance related cause.  She says that she could not see herself giving up going to Thanksgiving adding that her family comes first.  The therapist responds by suggesting that Shylyn's recovery should come first if she is to maintain long-term sobriety.  The therapist informs Tagen and other group members that putting recovery first will almost certainly involve having to make hard decisions and make sacrifices.  At the conclusion of group, Harlea asks this therapist for clarification regarding steps four and five as she is apparently covering  them in her faith based recovery program and church.  After providing feedback on this topic, the therapist observes that [place] was not making her recovery a priority when she first started [facility] but seems to be moving in this direction but that the jury is out as to whether it is her top priority as she claims it to be.   Progress Towards Goals: Deetya reports no change in her sobriety date.  UDS collected: Yes  Results: No   AA/NA attended?:  Yes   Sponsor?:  No   Elsie Maier, MA, [place], [facility], LCAS [name], MS, LMFT, LCAS 05/05/2024

## 2024-05-05 NOTE — Telephone Encounter (Signed)
 Called PT to reschedule appt. Left detailed voicemail

## 2024-05-06 ENCOUNTER — Telehealth (HOSPITAL_COMMUNITY): Payer: Self-pay

## 2024-05-06 NOTE — Telephone Encounter (Signed)
 Yena calls and leaves a message requesting [facility] IOP therapists to call her back about her brother.  Therapist calls today and reaches voice mail. Therapist leaves a HIPAA compliant message requesting a return call.  [name], MS, LMFT, LCAS

## 2024-05-07 ENCOUNTER — Ambulatory Visit (HOSPITAL_COMMUNITY)

## 2024-05-10 ENCOUNTER — Ambulatory Visit (HOSPITAL_COMMUNITY): Admitting: Licensed Clinical Social Worker

## 2024-05-10 DIAGNOSIS — F1020 Alcohol dependence, uncomplicated: Secondary | ICD-10-CM

## 2024-05-10 DIAGNOSIS — F1994 Other psychoactive substance use, unspecified with psychoactive substance-induced mood disorder: Secondary | ICD-10-CM | POA: Diagnosis not present

## 2024-05-10 NOTE — Progress Notes (Signed)
 Daily Group Progress Note   Program: [facility] IOP     Group Time: 9 a.m. to 12 p.m.      Type of Therapy: Process and Psychoeducational    Topic: The therapists check in with group members, assess for SI/HI/psychosis and overall level of functioning. The therapists inquire about sobriety date and number of community support meetings attended since last session.   The therapists answer group members' questions about the MAT drug, baclofen , and show a short video explaining the impact of this medication on the brain. The therapists emphasize that MAT is another tool in a person's recovery; however, a person on MAT still needs to avoid people, places, and things associated with drugs and alcohol .  The therapists facilitate a discussion on limit setting and co-dependency using one group member's situation with her family in particular to illustrate these concepts.    Summary: Leah Jones presents today rating her depression as a 6 and her anxiety as a 7. She attended a women's only AA meeting this weekend with two other women from group.  She is mostly quiet but attentive. Another group member's concerns take up a large part of the latter portion of group leaving not much time for Leah Jones to do her check-in.  She says that she would like to start the next group by checking in saying that her check-in is likely going to take a while and indicating that she has had another change in her sobriety date.   Leah Jones indicates that she put in a call to the women she wants to be her new Sponsor but has not heard back from her as of yet.      Progress Towards Goals: Leah Jones reports a change in her sobriety date.   UDS collected: Yes Results: No   AA/NA attended?:  Yes   Sponsor?:  No   Elsie Maier, MA, [place], [facility], LCAS [name], MS, LMFT, LCAS 05/10/2024

## 2024-05-11 DIAGNOSIS — M25511 Pain in right shoulder: Secondary | ICD-10-CM | POA: Diagnosis not present

## 2024-05-11 DIAGNOSIS — M25512 Pain in left shoulder: Secondary | ICD-10-CM | POA: Diagnosis not present

## 2024-05-12 ENCOUNTER — Ambulatory Visit (INDEPENDENT_AMBULATORY_CARE_PROVIDER_SITE_OTHER)

## 2024-05-12 DIAGNOSIS — F1994 Other psychoactive substance use, unspecified with psychoactive substance-induced mood disorder: Secondary | ICD-10-CM

## 2024-05-12 DIAGNOSIS — F1020 Alcohol dependence, uncomplicated: Secondary | ICD-10-CM | POA: Diagnosis not present
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
## 2024-05-13 ENCOUNTER — Encounter: Payer: Self-pay | Admitting: Medical

## 2024-05-14 ENCOUNTER — Ambulatory Visit: Admitting: Family Medicine

## 2024-05-14 ENCOUNTER — Telehealth: Payer: Self-pay

## 2024-05-14 ENCOUNTER — Ambulatory Visit (HOSPITAL_COMMUNITY): Admitting: Licensed Clinical Social Worker

## 2024-05-14 DIAGNOSIS — F1994 Other psychoactive substance use, unspecified with psychoactive substance-induced mood disorder: Secondary | ICD-10-CM | POA: Diagnosis not present

## 2024-05-14 DIAGNOSIS — F1020 Alcohol dependence, uncomplicated: Secondary | ICD-10-CM | POA: Diagnosis not present
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
## 2024-05-14 NOTE — Telephone Encounter (Signed)
 Reason for CRM: pt wanting to get a list of providers to refer her to within clinic that take her insurance. Advised to check with [facility] but please call pt back to confirm    Returned patient's call regarding list of providers who accept [employer].  Patient has been dismissed from our office. I am assuming from the CRM message to is requesting PCP providers whom accept her insurance.  I directed patient to go on [employer] or she can call Hulan to get a list of PCP providers.  Thank you, Jemarion Roycroft

## 2024-05-17 ENCOUNTER — Other Ambulatory Visit: Payer: Self-pay | Admitting: Family Medicine

## 2024-05-17 ENCOUNTER — Ambulatory Visit (HOSPITAL_COMMUNITY): Admitting: Licensed Clinical Social Worker

## 2024-05-17 DIAGNOSIS — F1020 Alcohol dependence, uncomplicated: Secondary | ICD-10-CM | POA: Diagnosis not present

## 2024-05-17 DIAGNOSIS — F1994 Other psychoactive substance use, unspecified with psychoactive substance-induced mood disorder: Secondary | ICD-10-CM
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
## 2024-05-19 ENCOUNTER — Ambulatory Visit (HOSPITAL_COMMUNITY)

## 2024-05-19 ENCOUNTER — Encounter (HOSPITAL_COMMUNITY): Payer: Self-pay

## 2024-05-19 ENCOUNTER — Telehealth (HOSPITAL_COMMUNITY): Payer: Self-pay | Admitting: Licensed Clinical Social Worker

## 2024-05-19 NOTE — Telephone Encounter (Signed)
 The therapist attempts to reach [facility] after she no shows for group leaving a HIPAA-compliant voicemail.  [name], MA, LCSW, [facility], LCAS 05/19/2024

## 2024-05-21 ENCOUNTER — Ambulatory Visit (HOSPITAL_COMMUNITY): Admitting: Licensed Clinical Social Worker

## 2024-05-21 DIAGNOSIS — F1994 Other psychoactive substance use, unspecified with psychoactive substance-induced mood disorder: Secondary | ICD-10-CM | POA: Diagnosis not present

## 2024-05-21 DIAGNOSIS — F1020 Alcohol dependence, uncomplicated: Secondary | ICD-10-CM | POA: Diagnosis not present

## 2024-05-21 NOTE — Progress Notes (Signed)
 Daily Group Progress Note   Program: [facility] IOP     Group Time: 9 a.m. to 12 p.m.      Type of Therapy: Process and Psychoeducational    Topic: The therapists check in with group members, assess for SI/HI/psychosis and overall level of functioning. The therapists inquire about sobriety date and number of community support meetings attended since last session.   The therapists facilitate group discussions on a number of topics including integrated dual disorders treatment and dealing with trauma in relation to recovery from addiction. The therapists discuss the history of AA explaining who Bill W. and Dr. Octaviano are and encouraging group members to read the big book for the basic text of Narcotics Anonymous.  The therapist recommend that people in recovery need to identify whether things such as people, their living situation, their job, etc. present as being a barrier to recovery; and if so, need to do something to remove this barrier.   Summary: Leah Jones presents today rating both her depression and anxiety as a 5.  She says that she went to a meeting on Wednesday night and reports no change in her sobriety date.  She says that she was not in group on Wednesday as she had a terrible headache and cramping indicating that she needs to see her gynecologist about these issues.  The therapists once again provide her with their direct contact number asking her and other group members to call and leave a message if they cannot make it to group.  Leah Jones describes her mood as frustrated and relieved.  She says that she is frustrated because she had a flat tire this morning but is relieved because one of the other group members came to pick her up.  She says that she has a new sponsor and that they met recently for coffee.  They are planning on doing steps 2 and 3.  The therapists provide her with the Narcotics Anonymous worksheets for steps 2 and 3 to assist her in being able to complete the steps.  The  therapists and other group members talked to [facility] about her current living situation questioning whether or not it is conducive to her remaining sober.  The therapist tells Leah Jones that she is living at this place for free but questions what price she puts on her mental stability and sobriety.  The therapist talks to her about considering going to an [place] house if things do not improve with her current living situation.     Progress Towards Goals: Leah Jones reports no change in her sobriety date.   UDS collected: No Results: Yes, negative for drugs and alcohol .   AA/NA attended?: Yes   Sponsor?:  Yes   Elsie Maier, MA, [place], [facility], LCAS [name], MS, LMFT, LCAS 05/21/2024

## 2024-05-24 ENCOUNTER — Telehealth (HOSPITAL_COMMUNITY): Payer: Self-pay

## 2024-05-24 NOTE — Telephone Encounter (Signed)
 Leah Jones calls in and leaves a message that she will not be in [facility] IOP on Monday, May 26, 2024 as she has an appointment that has already been scheduled for follow up on her arm.  [name], MS, LMFT, LCAS

## 2024-05-26 ENCOUNTER — Ambulatory Visit (INDEPENDENT_AMBULATORY_CARE_PROVIDER_SITE_OTHER): Admitting: Medical

## 2024-05-26 ENCOUNTER — Encounter (HOSPITAL_COMMUNITY): Payer: Self-pay | Admitting: Medical

## 2024-05-26 DIAGNOSIS — S8291XG Unspecified fracture of right lower leg, subsequent encounter for closed fracture with delayed healing: Secondary | ICD-10-CM

## 2024-05-26 DIAGNOSIS — R296 Repeated falls: Secondary | ICD-10-CM

## 2024-05-26 DIAGNOSIS — F1994 Other psychoactive substance use, unspecified with psychoactive substance-induced mood disorder: Secondary | ICD-10-CM

## 2024-05-26 DIAGNOSIS — F1020 Alcohol dependence, uncomplicated: Secondary | ICD-10-CM

## 2024-05-26 DIAGNOSIS — J301 Allergic rhinitis due to pollen: Secondary | ICD-10-CM

## 2024-05-26 DIAGNOSIS — E538 Deficiency of other specified B group vitamins: Secondary | ICD-10-CM

## 2024-05-26 DIAGNOSIS — R748 Abnormal levels of other serum enzymes: Secondary | ICD-10-CM

## 2024-05-26 DIAGNOSIS — G621 Alcoholic polyneuropathy: Secondary | ICD-10-CM

## 2024-05-26 DIAGNOSIS — I10 Essential (primary) hypertension: Secondary | ICD-10-CM

## 2024-05-26 DIAGNOSIS — E559 Vitamin D deficiency, unspecified: Secondary | ICD-10-CM

## 2024-05-26 DIAGNOSIS — Z91038 Other insect allergy status: Secondary | ICD-10-CM

## 2024-05-26 DIAGNOSIS — Z8659 Personal history of other mental and behavioral disorders: Secondary | ICD-10-CM

## 2024-05-26 DIAGNOSIS — S42301G Unspecified fracture of shaft of humerus, right arm, subsequent encounter for fracture with delayed healing: Secondary | ICD-10-CM

## 2024-05-26 NOTE — Progress Notes (Signed)
 Daily Group Progress Note   Program: CD IOP     Group Time: 9 a.m. to 12 p.m.      Type of Therapy: Process and Psychoeducational    Topic: The therapist checks in with group members, assesses for SI/HI/psychosis and overall level of functioning. The therapist inquires about sobriety date and number of community support meetings attended since last session.   The therapist explains that people with addiction issues  are often unable to see it in themselves but additionally are blind to addiction in others especially if comparing their more severe addiction problem to another with less severe addiction. The therapist educates group members on drinking patterns in the [place]  explaining that the overwhelming majority of people who drink consume three or less standard drinks per week with the top 10 to 20% of drinkers consuming 90% of the alcohol .  The therapist facilitates a discussion on the reasons that people in recovery should not be hanging on to relationships in which the other person remains in active addiction. The therapist notes that the Twelve Step program advises persons in recovery to avoid people, places, and things though what is most problematic and leads to most relapses is not avoiding people. The therapist validates that changing relationships and one's life is hard; however, they can only have what others in long-term recovery have if willing to go to any means necessary to obtain it.     Summary: Leah Jones presents today rating her depression as a 5 and her anxiety as a 6. Her living situation remains stressful as her roommate has had more accidents.   Leah Jones estimates that when she was drinking daily that she was consuming on average about 35 standard drinks per week.   When the therapist facilitates the discussion on avoiding people in active addiction, Leah Jones admits that this would be difficult and asks a lot of questions mostly centering on her friend with back problems who remains  active in addition. The friend says that she will attend a meeting with Leah Jones but then will not go claiming her back hurts.   The therapist explains to [facility] the pitfalls of the rescue fantasy and why AA is a program of attraction rather than compulsion. The therapist observes that Leah Jones's co-dependent behavior has led to using in the past and that the only person or person's recovery on which she needs to be focusing now is her own.   Leah Jones describes her mood at the end of group as questioning' and reflecting.       Progress Towards Goals: Leah Jones reports no change in her sobriety date.  UDS collected: Yes Results: No   AA/NA attended?: Yes   Sponsor?:  Yes   Elsie Maier, MA, LCSW, [facility], LCAS 05/26/2024

## 2024-05-26 NOTE — Progress Notes (Signed)
   [facility] Follow-up Outpatient CDIOP Date:   Admission Date:  Sobriety date:  Subjective:   HPI : CD IOP Provider FU  Counselor's report:  Review of Systems: Psychiatric: Agitation: See Counselor report Hallucination: No Depressed Mood: see Counselor reports Insomnia: No Hypersomnia: No Altered Concentration: No Feels Worthless: No Grandiose Ideas: No Belief In Special Powers: No New/Increased Substance Abuse: No Compulsions: In early withdrawal  Neurologic: Headache: No Seizure: No Paresthesias: No  Current Medications:   Mental Status Examination  Appearance: Alert: Yes Attention: good  Cooperative: Yes Eye Contact: Good Speech: Clear and coherent, rate WNL Psychomotor Activity: Normal Memory: Concentration/Attention: Normal/intact Oriented: person, place, time/date and situation Mood: Euthymic Affect: Appropriate and Congruent Thought Processes and Associations: Coherent and Intact Fund of Knowledge: Good Thought Content: WDL Insight:Limited Judgement:Impaired  UDS:  PDMP:  Diagnosis:   Assessment:  Treatment Plan: Leah Emmer, PA-CPatient ID: Leah Jones, female   DOB: 1960/12/06, 63 y.o.   MRN: [id number]

## 2024-05-28 ENCOUNTER — Ambulatory Visit (HOSPITAL_COMMUNITY)

## 2024-05-28 DIAGNOSIS — F1994 Other psychoactive substance use, unspecified with psychoactive substance-induced mood disorder: Secondary | ICD-10-CM

## 2024-05-28 DIAGNOSIS — F1020 Alcohol dependence, uncomplicated: Secondary | ICD-10-CM | POA: Diagnosis not present

## 2024-05-28 NOTE — Progress Notes (Addendum)
 Daily Group Progress Note   Program: CD IOP     Group Time: 9 a.m. to 12 p.m.      Type of Therapy: Process and Psychoeducational    Topic: The therapists check in with group members, assesses for SI/HI/psychosis and overall level of functioning. The therapist inquires about sobriety date and number of community support meetings attended since last session.   The therapists introduce the new group member. Therapists discuss and prompt discussion on the following issues today: Cannabis Hyperemesis, Treatment Centers that do not follow the science of addiction, as they will not admit persons who have the diagnosis of cannabis use disorder, unless they have another drug use disorder in addition to the diagnosis with the idea that cannabis is not a serious addiction that requires residential treatment, how perspectives differ among people, how a person in unable to think oneself out of addition, recovery taking a leap of faith requiring trust in the process of recovery and being open to listening what has helped others even when one totally does not totally intellectually understand how certain processes work, discuss co-dependency and the pitfalls of not putting one's recovery first, rescue fantasies.   Summary: Leah Jones presents today rating her depression as a 5 and her anxiety as a 6.  She reports the same sobriety date. She says she is attending meetings and has a sponsor. Leah Jones discusses how she just received a text from her friend that continues to be in active addiction.SABRA  Leah Jones does says they used to drink together and she is this persons's only friend.  Peers and therapist discuss with her the issues co-dependency and how this is not supporting her own recovery. After much discussion regarding how Leah Jones is not all in and continues to risk her own recovery, Leah Jones says he is going to text this friend and explain that is is nothing personal but she cannot continue to have interaction until the  friend gets in recovery.     Progress Towards Goals: Leah Jones reports no change in her sobriety date.  UDS collected: Yes Results: Negative   AA/NA attended?: Yes   Sponsor?:  Yes [name], MS, LMFT, LCAS  [address] [place], [place], [facility], LCAS 05/28/2024

## 2024-05-31 ENCOUNTER — Ambulatory Visit (HOSPITAL_COMMUNITY)

## 2024-05-31 ENCOUNTER — Telehealth (HOSPITAL_COMMUNITY): Payer: Self-pay

## 2024-05-31 NOTE — Telephone Encounter (Signed)
 Leah Jones calls and leaves a message this morning at 6:25 am staying that she has car Issues and does not know what is involved. She says she will see us  on Wednesday,  [name], MS, LMFT, LCAS

## 2024-06-01 ENCOUNTER — Other Ambulatory Visit: Payer: Self-pay | Admitting: Family Medicine

## 2024-06-02 ENCOUNTER — Ambulatory Visit (INDEPENDENT_AMBULATORY_CARE_PROVIDER_SITE_OTHER): Admitting: Licensed Clinical Social Worker

## 2024-06-02 DIAGNOSIS — F1020 Alcohol dependence, uncomplicated: Secondary | ICD-10-CM | POA: Diagnosis not present

## 2024-06-02 DIAGNOSIS — F1994 Other psychoactive substance use, unspecified with psychoactive substance-induced mood disorder: Secondary | ICD-10-CM

## 2024-06-02 NOTE — Progress Notes (Signed)
 Daily Group Progress Note   Program: CD IOP     Group Time: 9 a.m. to 12 p.m.      Type of Therapy: Process and Psychoeducational    Topic: The therapists check in with group members, assess for SI/HI/psychosis and overall level of functioning. The therapists inquire about sobriety date and number of community support meetings attended since last session.   The therapists introduce a new group member while announcing the recent passing of another. The therapists discuss the importance of tobacco cessation making group members aware of Quit[place]. The therapists explain that quitting is not only one of the best things a person can do for his or her health but also increases their ability to achieve long-term sobriety from drugs and alcohol .  The therapists discuss the importance of assertiveness as it relates to recovery and overcoming grief. The therapists continue to emphasize the importance of avoiding people who are in active addiction and challenge group members to consider why others in active addiction take the inventories of other people rather than focusing on themselves. Also, they encourage group members to consider why people in active addiction are not a healthy source of emotional support.     Summary: Leah Jones returns to group today rating both her depression and anxiety as a 5.  She describes her mood as sad and confused.  She is sad to learn of the passing of the former group member and admits to being confused about steps 8 and 9.  Leah Jones says that she was working steps 1 and 2 with her sponsor but is working on steps 8 and 9 at her church's recovery group.  Leah Jones has concluded that she is not going to keep her current sponsor given that she is running into the problem that she had with her in the past which is being unable to get in touch with her.  The therapist makes the observation that Leah Jones's last 2 sponsors have also been connected with her church and suggest that it  might be beneficial for her to get a new sponsor who is not connected with her church to which Leah Jones is in full agreement.  The therapist and other group members inform Leah Jones that the program at her church is giving her an overview of the steps and it is not surprising that she is struggling with steps 8 and 9 as she has not completed steps 1 or 2.  She is informed that there is a reason that the steps are done in a specific order.  The therapists explain that step 1 is not only a thinking step but an action step and if a person truly recognizes that she is powerless over alcohol  that she would not be interacting with people who are in active addiction as Leah Jones has been doing with her friend who continues to drink.  Leah Jones has not asserted herself with her friend and told her the reason that she can no longer associate with her but instead has been dealing with this passively by ghosting this friend.  The therapist models for Leah Jones how she can assertively have this discussion with her friend and suggest that she can tell this friend that she is willing to have contact with her at [employer] but nowhere else.     Progress Towards Goals: Leah Jones reports no change in her sobriety date.  UDS collected: No Results: No   AA/NA attended?: Yes   Sponsor?:  No   Elsie Maier, MA, [place], [facility],  LCAS [name], MS, LMFT, LCAS 06/02/2024

## 2024-06-03 ENCOUNTER — Encounter: Payer: Self-pay | Admitting: Medical

## 2024-06-04 ENCOUNTER — Ambulatory Visit (INDEPENDENT_AMBULATORY_CARE_PROVIDER_SITE_OTHER)

## 2024-06-04 DIAGNOSIS — F1020 Alcohol dependence, uncomplicated: Secondary | ICD-10-CM

## 2024-06-04 DIAGNOSIS — F1994 Other psychoactive substance use, unspecified with psychoactive substance-induced mood disorder: Secondary | ICD-10-CM

## 2024-06-04 NOTE — Progress Notes (Signed)
 Daily Group Progress Note   Program: CD IOP     Group Time: 9 a.m. to 12 p.m.      Type of Therapy: Process and Psychoeducational    Topic: The therapists check in with group members, assess for SI/HI/psychosis and overall level of functioning. The therapists inquire about sobriety date and number of community support meetings attended since last session.   Therapist discusses the purpose of chips in 12 step meetings: how picking up a white chip can signal to others that the person is in need of support, chips can also given an acknowledgment/reinforcement for time spent in sobriety. Therapist discusses types of communication, those being passive, assertive, aggressive and passive aggressive, noting  that passive communication priorities other's needs before their own and violates their own rights, Assertive communication which respects both one's own needs and the needs of others while aggressive communication violates the rights of others while the person feels their own needs have priority. Therapist debunked the three myths of assertiveness including assertiveness is basically the same as being aggressive, If I am assertive I will get what I want and if I am assertive I have to be assertive in every situation. Therapist distributes the exercise where clients rate themselves as being less or more able to be assertive with several different areas. Within the time constraints the group covering the first situation of being assertive, that being to rate how difficult how difficult it is to say no.  With the report of a couple of members reporting they have stopped the use of their baclofen  due to cravings disappearing, therapist discusses the risks of stopping as they may find craving returning. Discuss the importance of MAT especially in early recovery.    Summary: Leah Jones returns to group today rating both her depression as a 5 and her anxiety as a 6. Leah Jones reports the same sobriety date.  She says  she is attending meetings but needs to get a sponsor outside of her church. She says she is planning to go to the women's meeting on Saturday morning and ask for a sponsor.  She reports that after the meeting she is going to [employer] to help with the signing of the book that one of the ladies that run the church recovery group has written. Leah Jones identifies her emotions as tired and numb. She says she has been getting up at 6am to take her son to work.  Leah Jones identifies her communication style as passive and note it is difficult for her to say no to anyone.  Therapist discusses the cost of maintaining this communication style.    Progress Towards Goals: Leah Jones reports no change in her sobriety date.  UDS collected: no Results: none   AA/NA attended?: Yes   Sponsor?:  No    Leah Jones Simpler, MS, LMFT, LCAS 06/04/2024

## 2024-06-07 ENCOUNTER — Telehealth (HOSPITAL_COMMUNITY): Payer: Self-pay | Admitting: Licensed Clinical Social Worker

## 2024-06-07 ENCOUNTER — Ambulatory Visit (HOSPITAL_COMMUNITY)

## 2024-06-07 ENCOUNTER — Telehealth (HOSPITAL_COMMUNITY): Payer: Self-pay

## 2024-06-07 NOTE — Telephone Encounter (Signed)
 Leah Jones leaves a message saying that she will not be in group today due to an event she needs to attend at her [facility] tomorrow morning. Additionally, she suggests that it is about time for her to discharge from IOP as she has two jobs she wants to take so as to have money for [employer].  She asks that the therapists call her tomorrow to discuss this.  [name], MA, LCSW, [facility], LCAS 06/07/2024

## 2024-06-07 NOTE — Telephone Encounter (Signed)
 Therapist reaches out to [facility] as was per her request when she called and left a message over the weekend.  She had indicated that she no longer had time to attend [facility] IOP and wants to discharge but asks that one of the therapist's call her.  Therapist calls's however reaches voice mail. Therapist leaves a HIPAA complaint voice mail requesting a return call.  [name], MS, LMFT, LCAS

## 2024-06-09 ENCOUNTER — Ambulatory Visit (HOSPITAL_COMMUNITY)

## 2024-06-09 ENCOUNTER — Telehealth (HOSPITAL_COMMUNITY): Payer: Self-pay | Admitting: Licensed Clinical Social Worker

## 2024-06-09 ENCOUNTER — Ambulatory Visit: Admitting: Podiatry

## 2024-06-09 NOTE — Telephone Encounter (Signed)
 The therapist returns Leah Jones's tolerance [name]'s telephone call leaving a HIPAA compliant voicemail voicemail.  [name], MA, LCSW, [facility], LCAS 06/09/2024

## 2024-06-11 ENCOUNTER — Ambulatory Visit (HOSPITAL_COMMUNITY)

## 2024-06-14 ENCOUNTER — Ambulatory Visit: Admitting: Podiatry

## 2024-06-14 ENCOUNTER — Telehealth (HOSPITAL_COMMUNITY): Payer: Self-pay | Admitting: Licensed Clinical Social Worker

## 2024-06-14 NOTE — Telephone Encounter (Signed)
 The therapist receives a message from [place] saying that she is going out of town to spend Thanksgiving with her sister so will not be in group until next week.  The therapist returns her call leaving a HIPAA-compliant voicemail informing her to not return until she has spoken to this therapist or Ms. [name], LMFT, [address] [place], [place], LCSW, [facility], LCAS 06/14/2024

## 2024-06-15 ENCOUNTER — Other Ambulatory Visit: Payer: Self-pay | Admitting: Family Medicine

## 2024-06-18 ENCOUNTER — Other Ambulatory Visit: Payer: Self-pay | Admitting: Family Medicine

## 2024-07-01 ENCOUNTER — Other Ambulatory Visit (HOSPITAL_COMMUNITY): Admission: EM | Admit: 2024-07-01 | Discharge: 2024-07-04 | Disposition: A | Source: Intra-hospital

## 2024-07-01 ENCOUNTER — Telehealth (HOSPITAL_COMMUNITY): Payer: Self-pay

## 2024-07-01 ENCOUNTER — Ambulatory Visit (HOSPITAL_COMMUNITY)
Admission: EM | Admit: 2024-07-01 | Discharge: 2024-07-01 | Disposition: A | Discharge status: Other Acute Inpatient Hospital

## 2024-07-01 ENCOUNTER — Other Ambulatory Visit: Payer: Self-pay

## 2024-07-01 DIAGNOSIS — F1090 Alcohol use, unspecified, uncomplicated: Secondary | ICD-10-CM

## 2024-07-01 LAB — POCT URINE DRUG SCREEN - MANUAL ENTRY (I-SCREEN)
POC Amphetamine UR: NOT DETECTED
POC Buprenorphine (BUP): NOT DETECTED
POC Cocaine UR: NOT DETECTED
POC Marijuana UR: NOT DETECTED
POC Methadone UR: NOT DETECTED
POC Methamphetamine UR: NOT DETECTED
POC Morphine: NOT DETECTED
POC Oxazepam (BZO): NOT DETECTED
POC Oxycodone UR: NOT DETECTED
POC Secobarbital (BAR): NOT DETECTED

## 2024-07-01 LAB — COMPREHENSIVE METABOLIC PANEL WITH GFR
ALT: 19 U/L (ref 0–44)
AST: 25 U/L (ref 15–41)
Albumin: 3.9 g/dL (ref 3.5–5.0)
Alkaline Phosphatase: 58 U/L (ref 38–126)
Anion gap: 14 (ref 5–15)
BUN: 5 mg/dL — ABNORMAL LOW (ref 8–23)
CO2: 24 mmol/L (ref 22–32)
Calcium: 8.8 mg/dL — ABNORMAL LOW (ref 8.9–10.3)
Chloride: 98 mmol/L (ref 98–111)
Creatinine, Ser: 0.66 mg/dL (ref 0.44–1.00)
GFR, Estimated: >60 mL/min (ref >60)
Glucose, Bld: 140 mg/dL — ABNORMAL HIGH (ref 70–99)
Potassium: 3.7 mmol/L (ref 3.5–5.1)
Sodium: 136 mmol/L (ref 135–145)
Total Bilirubin: 1 mg/dL (ref 0.0–1.2)
Total Protein: 7.1 g/dL (ref 6.5–8.1)

## 2024-07-01 LAB — CBC
HCT: 39.8 % (ref 36.0–46.0)
Hemoglobin: 13.3 g/dL (ref 12.0–15.0)
MCH: 32.1 pg (ref 26.0–34.0)
MCHC: 33.4 g/dL (ref 30.0–36.0)
MCV: 96.1 fL (ref 80.0–100.0)
Platelets: 292 K/uL (ref 150–400)
RBC: 4.14 MIL/uL (ref 3.87–5.11)
RDW: 14.2 % (ref 11.5–15.5)
WBC: 5.4 K/uL (ref 4.0–10.5)
nRBC: 0 % (ref 0.0–0.2)

## 2024-07-01 LAB — URINALYSIS, ROUTINE W REFLEX MICROSCOPIC
Bilirubin Urine: NEGATIVE
Glucose, UA: NEGATIVE mg/dL
Ketones, ur: NEGATIVE mg/dL
Leukocytes,Ua: NEGATIVE
Nitrite: NEGATIVE
Protein, ur: NEGATIVE mg/dL
Specific Gravity, Urine: 1.002 — ABNORMAL LOW (ref 1.005–1.030)
pH: 6 (ref 5.0–8.0)

## 2024-07-01 LAB — FOLATE: Folate: 13.6 ng/mL (ref >5.9)

## 2024-07-01 LAB — MAGNESIUM: Magnesium: 2.3 mg/dL (ref 1.7–2.4)

## 2024-07-01 LAB — VITAMIN B12: Vitamin B-12: 177 pg/mL — ABNORMAL LOW (ref 180–914)

## 2024-07-01 LAB — PHOSPHORUS: Phosphorus: 3.7 mg/dL (ref 2.5–4.6)

## 2024-07-01 MED ORDER — THIAMINE HCL 100 MG/ML IJ SOLN
100.0000 mg | Freq: Every day | INTRAMUSCULAR | Status: DC
  Filled 2024-07-01: qty 2

## 2024-07-01 MED ORDER — THIAMINE MONONITRATE 100 MG PO TABS: 100.0000 mg | ORAL_TABLET | Freq: Every day | ORAL | Status: DC

## 2024-07-01 MED ORDER — OLANZAPINE 10 MG IM SOLR: 5.0000 mg | Freq: Three times a day (TID) | INTRAMUSCULAR | Status: DC | PRN

## 2024-07-01 MED ORDER — LORAZEPAM 1 MG PO TABS: 1.0000 mg | ORAL_TABLET | ORAL | Status: DC | PRN

## 2024-07-01 MED ORDER — ADULT MULTIVITAMIN W/MINERALS CH
1.0000 | ORAL_TABLET | Freq: Every day | ORAL | Status: DC
  Administered 2024-07-01 – 2024-07-02 (×2): 1 via ORAL
  Filled 2024-07-01 (×2): qty 1

## 2024-07-01 MED ORDER — OLANZAPINE 10 MG IM SOLR: 10.0000 mg | Freq: Three times a day (TID) | INTRAMUSCULAR | Status: DC | PRN

## 2024-07-01 MED ORDER — LORAZEPAM 2 MG/ML IJ SOLN: 1.0000 mg | INTRAMUSCULAR | Status: DC | PRN

## 2024-07-01 MED ORDER — MAGNESIUM HYDROXIDE 400 MG/5ML PO SUSP: 30.0000 mL | Freq: Every day | ORAL | Status: DC | PRN

## 2024-07-01 MED ORDER — GABAPENTIN 300 MG PO CAPS
300.0000 mg | ORAL_CAPSULE | Freq: Three times a day (TID) | ORAL | Status: DC
  Administered 2024-07-01 – 2024-07-04 (×9): 300 mg via ORAL
  Filled 2024-07-01 (×9): qty 1

## 2024-07-01 MED ORDER — THIAMINE MONONITRATE 100 MG PO TABS
100.0000 mg | ORAL_TABLET | Freq: Every day | ORAL | Status: DC
  Administered 2024-07-01: 100 mg via ORAL
  Filled 2024-07-01 (×2): qty 1

## 2024-07-01 MED ORDER — OLANZAPINE 5 MG PO TBDP: 5.0000 mg | ORAL_TABLET | Freq: Three times a day (TID) | ORAL | Status: DC | PRN

## 2024-07-01 MED ORDER — LORAZEPAM 1 MG PO TABS
1.0000 mg | ORAL_TABLET | ORAL | Status: DC | PRN
  Administered 2024-07-01 – 2024-07-03 (×6): 1 mg via ORAL
  Filled 2024-07-01 (×6): qty 1

## 2024-07-01 MED ORDER — ADULT MULTIVITAMIN W/MINERALS CH: 1.0000 | ORAL_TABLET | Freq: Every day | ORAL | Status: DC

## 2024-07-01 MED ORDER — ALUM & MAG HYDROXIDE-SIMETH 200-200-20 MG/5ML PO SUSP: 30.0000 mL | ORAL | Status: DC | PRN

## 2024-07-01 MED ORDER — THIAMINE HCL 100 MG/ML IJ SOLN: 100.0000 mg | Freq: Every day | INTRAMUSCULAR | Status: DC

## 2024-07-01 NOTE — ED Provider Notes (Signed)
 Facility Based Crisis Admission H&P  Date: 07/01/2024 Patient Name: Leah Jones MRN: [id number] Chief Complaint: I just last month drank for a day  Diagnoses:  Final diagnoses:  Alcohol  use disorder    HPI: Ms. Vanblarcom is a 63 year old female with a history of ADD, AUD, MDD, GAD who presented to the clinic today for alcohol  detox. She reported that over the past 1 month she has been drinking every now and then my body starts shaking, heart rate increases so I drink again .  Reported that she has been experiencing some withdrawals that includes shaking, tachycardia and nausea.  As per the reason she stated that holiday is coming, me and my boyfriend broke up, my living situation, I fell and broke my elbow . Stated that I need detox .  Reported that she has been to the [facility] for 100 days in July and was doing therapy for substance use at the [facility] clinic.  Stated that since drinking every morning she has been having shakes, jitteriness and trembling voice. Reported that her longest petro sobriety has been 1 month but was unable to give details about how long has she been drinking.  She denied any seizure-like activity or any history of it.  She denied any active or passive SI/HI/AVH.  She reported disturbed sleep due to alcohol  and my sons car broke down and I have to take him to work every morning at 5:30 AM .  She also reported poor appetite due to the ongoing stressors and alcohol  use.  When asked about depression it situational, no depression , and anxiety it situational as well .  She denied any symptoms of mania or psychosis. Reported that in the past she has been on lithium and Prozac for mood stabilization it made me mad , and also on trazodone , Adderall, buspirone , propranolol  and Atarax  but stated that she has not been taking depression or anxiety medicines for years now. Discussed about admitting her for detox at the [facility], patient amenable to the plan.  PHQ 2-9:   [employer] Visit from 11/14/2023 in [facility] at [facility] Visit from 12/10/2022 in [facility] Visit from 11/20/2022 in [facility]  Thoughts that you would be better off dead, or of hurting yourself in some way Not at all Not at all Not at all  PHQ-9 Total Score 12 0 10    Flowsheet Row ED from 07/01/2024 in [facility] ED from 08/23/2022 in [facility] Emergency Department at [facility]  C-SSRS RISK CATEGORY No Risk No Risk      Total Time spent with patient: 45 minutes  Musculoskeletal  Strength & Muscle Tone: within normal limits Gait & Station: normal Patient leans: N/A  Psychiatric Specialty Exam  Presentation General Appearance: Appropriate for Environment; Casual  Eye Contact:Good  Speech:Slurred  Speech Volume:Normal  Handedness:Right   Mood and Affect  Mood:Euthymic  Affect:Congruent   Thought Process  Thought Processes:Coherent; Linear  Descriptions of Associations:Intact  Orientation:Full (Time, Place and Person)  Thought Content:Logical  Diagnosis of Schizophrenia or Schizoaffective disorder in past: No   Hallucinations:Hallucinations: None  Ideas of Reference:None  Suicidal Thoughts:Suicidal Thoughts: No  Homicidal Thoughts:Homicidal Thoughts: No   Sensorium  Memory:Immediate Good  Judgment:Fair  Insight:Fair   Executive Functions  Concentration:Good  Attention Span:Good  Recall:Good  Fund of Knowledge:Good  Language:Good   Psychomotor Activity  Psychomotor Activity:Psychomotor Activity: Normal  Assets  Assets:Communication Skills; Desire for Improvement; Financial Resources/Insurance; Housing; Resilience; Transportation   Sleep  Sleep:Sleep: Fair   Nutritional Assessment (For OBS and FBC admissions only) Has the patient had a weight loss or gain of 10 pounds or more in the  last 3 months?: No Has the patient had a decrease in food intake/or appetite?: No Does the patient have dental problems?: No Does the patient have eating habits or behaviors that may be indicators of an eating disorder including binging or inducing vomiting?: No Has the patient recently lost weight without trying?: 0 Has the patient been eating poorly because of a decreased appetite?: 0 Malnutrition Screening Tool Score: 0    Physical Exam Vitals and nursing note reviewed.  Constitutional:      General: She is not in acute distress.    Appearance: She is well-developed.  HENT:     Head: Normocephalic and atraumatic.  Eyes:     Conjunctiva/sclera: Conjunctivae normal.  Cardiovascular:     Rate and Rhythm: Normal rate and regular rhythm.     Heart sounds: No murmur heard. Pulmonary:     Effort: Pulmonary effort is normal. No respiratory distress.     Breath sounds: Normal breath sounds.  Abdominal:     Palpations: Abdomen is soft.     Tenderness: There is no abdominal tenderness.  Musculoskeletal:        General: No swelling.     Cervical back: Neck supple.  Skin:    General: Skin is warm and dry.     Capillary Refill: Capillary refill takes less than 2 seconds.  Neurological:     Mental Status: She is alert.  Psychiatric:        Mood and Affect: Mood normal.        Behavior: Behavior normal.    Review of Systems  Psychiatric/Behavioral:  Positive for substance abuse. Negative for depression, hallucinations and suicidal ideas. The patient has insomnia. The patient is not nervous/anxious.     Blood pressure (!) 144/102, pulse (!) 116, temperature 98.5 F (36.9 C), temperature source Temporal, resp. rate 16, last menstrual period 04/18/2019, SpO2 96%. There is no height or weight on file to calculate BMI.  Past Psychiatric History: She has a history of ADD, alcohol  use disorder, MDD, GAD  Is the patient at risk to self? No  Has the patient been a risk to self in the  past 6 months? No .    Has the patient been a risk to self within the distant past? No   Is the patient a risk to others? No   Has the patient been a risk to others in the past 6 months? No   Has the patient been a risk to others within the distant past? No   Past Medical History: Hypertension, allergic rhinitis, neuropathy, sleep disturbances, tremors, B12 deficiency Family History: Nothing significant Social History: Patient currently lives with a roommate, has had conflicts with roommate.  Last Labs:  Admission on 07/01/2024  Component Date Value Ref Range Status   POC Amphetamine  UR 07/01/2024 None Detected  NONE DETECTED (Cut Off Level 1000 ng/mL) Final   POC Secobarbital (BAR) 07/01/2024 None Detected  NONE DETECTED (Cut Off Level 300 ng/mL) Final   POC Buprenorphine (BUP) 07/01/2024 None Detected  NONE DETECTED (Cut Off Level 10 ng/mL) Final   POC Oxazepam (BZO) 07/01/2024 None Detected  NONE DETECTED (Cut Off Level 300 ng/mL) Final   POC Cocaine UR 07/01/2024 None Detected  NONE DETECTED (Cut  Off Level 300 ng/mL) Final   POC Methamphetamine UR 07/01/2024 None Detected  NONE DETECTED (Cut Off Level 1000 ng/mL) Final   POC Morphine 07/01/2024 None Detected  NONE DETECTED (Cut Off Level 300 ng/mL) Final   POC Methadone UR 07/01/2024 None Detected  NONE DETECTED (Cut Off Level 300 ng/mL) Final   POC Oxycodone UR 07/01/2024 None Detected  NONE DETECTED (Cut Off Level 100 ng/mL) Final   POC Marijuana UR 07/01/2024 None Detected  NONE DETECTED (Cut Off Level 50 ng/mL) Final  Scanned Document on 04/28/2024  Component Date Value Ref Range Status   Creatinine, POC 04/09/2024 40.0  mg/dL Final   ABS BY HIM   Creatinine, POC 04/14/2024 63.0  mg/dL Final   ABS BY HIM  Scanned Document on 04/22/2024  Component Date Value Ref Range Status   Creatinine, POC 03/08/2024 34  mg/dL Final   Abstracted by HIM  Office Visit on 02/10/2024  Component Date Value Ref Range Status   Vitamin B-12  02/10/2024 590  211 - 911 pg/mL Final   Folate 02/10/2024 17.8  >5.9 ng/mL Final   Magnesium  02/10/2024 1.8  1.5 - 2.5 mg/dL Final   VITD 92/77/7974 20.50 (L)  30.00 - 100.00 ng/mL Final   Vitamin B1 (Thiamine ) 02/10/2024 16  8 - 30 nmol/L Final   Comment: (Note) Vitamin supplementation within 24 hours prior to blood  draw may affect the accuracy of the results. . This test was developed and its analytical performance  characteristics have been determined by [employer]. It has not been cleared or approved by FDA.  This assay has been validated pursuant to the CLIA  regulations and is used for clinical purposes. . [facility] [address] [place] [id number] [phone] Johanna Agent L. Gino, MD, PhD     Allergies: Bactrim [sulfamethoxazole-trimethoprim ], Sulfa antibiotics, Lisinopril , and Seroquel  [quetiapine  fumarate]  Medications:  Facility Ordered Medications  Medication   LORazepam  (ATIVAN ) tablet 1-4 mg   Or   LORazepam  (ATIVAN ) injection 1-4 mg   thiamine  (VITAMIN B1) tablet 100 mg   Or   thiamine  (VITAMIN B1) injection 100 mg   multivitamin with minerals tablet 1 tablet   alum & mag hydroxide-simeth (MAALOX/MYLANTA) 200-200-20 MG/5ML suspension 30 mL   magnesium  hydroxide (MILK OF MAGNESIA) suspension 30 mL   OLANZapine zydis (ZYPREXA) disintegrating tablet 5 mg   PTA Medications  Medication Sig   medroxyPROGESTERone (PROVERA) 2.5 MG tablet Take 2.5 mg by mouth daily.   estradiol  (ESTRACE ) 1 MG tablet Take 1 mg by mouth daily.   b complex vitamins capsule Take by mouth.   EPINEPHrine  0.3 mg/0.3 mL IJ SOAJ injection Inject 0.3 mg into the muscle as needed for anaphylaxis.   Melatonin 3 MG SUBL Take 1 tablet by mouth at bedtime as needed.   propranolol  ER (INDERAL  LA) 120 MG 24 hr capsule Take 1 capsule (120 mg total) by mouth daily.   naltrexone  (DEPADE) 50 MG tablet Take 2 tablets (100 mg total) by mouth at bedtime.    guanFACINE  (TENEX ) 1 MG tablet Take 1 tablet (1 mg total) by mouth at bedtime.   DULoxetine  (CYMBALTA ) 60 MG capsule Take 1 capsule (60 mg total) by mouth daily.   cetirizine  (ZYRTEC ) 10 MG tablet Take 1 tablet (10 mg total) by mouth at bedtime.   fluticasone  (FLONASE ) 50 MCG/ACT nasal spray 1 spray daily. (Patient not taking: Reported on 02/18/2024)   atomoxetine  (STRATTERA ) 40 MG capsule Take 40 mg by  mouth every morning.   gabapentin  (NEURONTIN ) 300 MG capsule Take 1 capsule (300 mg total) by mouth 3 (three) times daily.   amLODipine  (NORVASC ) 10 MG tablet Take 1 tablet (10 mg total) by mouth daily.   aspirin EC 81 MG tablet Take 81 mg by mouth.   baclofen  (LIORESAL ) 10 MG tablet Take 1 tablet (10 mg total) by mouth 3 (three) times daily.   folic acid  (FOLVITE ) 1 MG tablet TAKE 1 TABLET BY MOUTH DAILY   Vitamin D , Ergocalciferol , (DRISDOL ) 1.25 MG (50000 UNIT) CAPS capsule TAKE 1 CAPSULE BY MOUTH EVERY 7 DAYS    Long Term Goals: Improvement in symptoms so as ready for discharge  Short Term Goals: Patient will verbalize feelings in meetings with treatment team members., Patient will attend at least of 50% of the groups daily., Pt will complete the PHQ9 on admission, day 3 and discharge., Patient will participate in completing the [place] Suicide Severity Rating Scale, Patient will score a low risk of violence for 24 hours prior to discharge, and Patient will take medications as prescribed daily.  Medical Decision Making  Ms. Hertzog is a 63 year old female with a history of ADD, AUD, MDD, GAD that presented to the clinic for alcohol  detox.  She has been on medication in the past but has been stabilized without the need of medications.  She has poor coping skills, fair insight into her condition and has been relying on alcohol  use as a means to relieve stress.  She she does have psychosocial stressors that includes her living situation, relationship issues, physical concerns including pain that are  contributing to her presentation.  Disturbed sleep due to the ongoing stressors and having to take care of her son every morning to work is also contributing to her energy levels.  She does have poor appetite due to the above stressors.  She is not actively or passively homicidal or suicidal.  Her BP was elevated, likely withdrawing/has been out of her propanolol, will continue to monitor. She was alert and oriented today and it would be appropriate for her to get admitted to [facility] for detox.  Will place her on CIWA protocol for alcohol  withdrawal.     Recommendations  Based on my evaluation the patient does not appear to have an emergency medical condition. - Admit to [facility] - Start CIWA protocol for alcohol  withdrawals, allergic to librium - Labs ordered, pending results - Will continue to monitor  Jacqualin Sells, MD 07/01/2024  8:11 PM

## 2024-07-01 NOTE — BH Assessment (Signed)
 Comprehensive Clinical Assessment (CCA) Note  07/01/2024 Leah Jones [id number]  Chief Complaint:  Chief Complaint  Patient presents with   Alcohol  Problem  Disposition: Per Kapoor, Sahil, MD patient is recommended for admission to [facility].  The patient demonstrates the following risk factors for suicide: Chronic risk factors for suicide include: psychiatric disorder of ADD, AUD, MDD, GAD  and substance use disorder. Acute risk factors for suicide include: family or marital conflict, social withdrawal/isolation, and loss (financial, interpersonal, professional). Protective factors for this patient include: hope for the future. Considering these factors, the overall suicide risk at this point appears to be low. Patient is not appropriate for outpatient follow up.  Per Provider note  Leah Jones is a 63 year old female with a history of ADD, AUD, MDD, GAD who presented to the clinic today for alcohol  detox. She reported that over the past 1 month she has been drinking every now and then my body starts shaking, heart rate increases so I drink again .  Reported that she has been experiencing some withdrawals that includes shaking, tachycardia and nausea.  As per the reason she stated that holiday is coming, me and my boyfriend broke up, my living situation, I fell and broke my elbow . Stated that I need detox .  Reported that she has been to the [facility] for 100 days in July and was doing therapy for substance use at the [facility] clinic.  Stated that since drinking every morning she has been having shakes, jitteriness and trembling voice. Reported that her longest petro sobriety has been 1 month but was unable to give details about how long has she been drinking.  She denied any seizure-like activity or any history of it.  She denied any active or passive SI/HI/AVH.  She reported disturbed sleep due to alcohol  and my sons car broke down and I have to take him to work every morning at 5:30 AM .  She  also reported poor appetite due to the ongoing stressors and alcohol  use.  When asked about depression it situational, no depression , and anxiety it situational as well .  She denied any symptoms of mania or psychosis. Reported that in the past she has been on lithium and Prozac for mood stabilization it made me mad , and also on trazodone , Adderall, buspirone , propranolol  and Atarax  but stated that she has not been taking depression or anxiety medicines for years now. Discussed about admitting her for detox at the [facility], patient amenable to the plan.   Patient is a 63 year old female with a history of ADD, AUD, MDD, GAD  who [facility]. Patient resides in the home with a roommate per her report. Patient reports isolation, crying spells, irritability, hopelessness, guilt, loss of interest to do things they enjoy, fatigue, lack of concentration, worthlessness, change in sleep, and change in appetite. Patient has a hx of Substance Abuse: Alcohol . Last use was this morning a couple of white claws. Patient reports drinking alcohol  daily. She denies any other substance use.Patient denies NSSIB, SI, HI, and AVH.  Patient identifies her primary stressors as a recent break up, ongoing issues with her children, some medical concerns,and her living situation.Patient denies history of abuse or trauma. Patient reports current legal problems from a DWI and states she had a court date today but she did not have to go and it was continued to another date. Patient is receiving outpatient therapy and psychiatry services, at [facility] outpatient  with Leah Jones and Leah Jones. Patient denies access to weapons.  Treatment options were discussed and patient is in agreement with recommendation for admission to [facility].   During evaluation patient is in no acute distress. She is alert, oriented x 4, anxious, cooperative and attentive. Her mood is anxious and  appropriate with congruent affect. She has normal speech, and behavior.  Objectively there is no evidence of psychosis/mania or delusional thinking.  Patient is able to converse coherently, goal directed thoughts, no distractibility, or pre-occupation. She does appear to have some tremors during the assessment. Patient answered question appropriately.      Visit Diagnosis:   Alcohol  use disorder   CCA Screening, Triage and Referral (STR)  Patient Reported Information How did you hear about us ? Self  What Is the Reason for Your Visit/Call Today? Per triage note  Leah Jones [phone] female presents to [facility] accompanied by her son who has now left. PT states she would like to detox from alcohol . PT is being seen upstairs for outpatient therapy. PT shares she has been drinking everyday for the past month averaging to about 6 white claws a day and 1 beer. PT explains that she experiences withdrawal symptoms when she stops drinking - shaking, heart beats fast and a little nauseated on and off. PT denies SI, HI, AVH and substance use.  How Long Has This Been Causing You Problems? 1-6 months  What Do You Feel Would Help You the Most Today? Alcohol  or Drug Use Treatment   Have You Recently Had Any Thoughts About Hurting Yourself? No  Are You Planning to Commit Suicide/Harm Yourself At This time? No   Flowsheet Row ED from 07/01/2024 in [facility] ED from 08/23/2022 in [facility] Department at [facility]  C-SSRS RISK CATEGORY No Risk No Risk    Have you Recently Had Thoughts About Hurting Someone Leah Jones? No  Are You Planning to Harm Someone at This Time? No  Explanation: pt denies   Have You Used Any Alcohol  or Drugs in the Past 24 Hours? Yes  How Long Ago Did You Use Drugs or Alcohol ? today What Did You Use and How Much? white claws this morning   Do You Currently Have a Therapist/Psychiatrist? Yes  Name of Therapist/Psychiatrist: Name of  Therapist/Psychiatrist: [facility]- outpatient Leah Jones, Leah Jones   Have You Been Recently Discharged From Any Office Practice or Programs? No  Explanation of Discharge From Practice/Program: n/a    CCA Screening Triage Referral Assessment Type of Contact: Face-to-Face  Telemedicine Service Delivery:   Is this Initial or Reassessment?   Date Telepsych consult ordered in [facility]:    Time Telepsych consult ordered in [facility]:    Location of Assessment: [facility] Assessment Services  Provider Location: [facility] Assessment Services   Collateral Involvement: n/a   Does Patient Have a Automotive Engineer Guardian? No  Legal Guardian Contact Information: n/a  Copy of Legal Guardianship Form: -- (n/a)  Legal Guardian Notified of Arrival: -- (n/a)  Legal Guardian Notified of Pending Discharge: -- (n/a)  If Minor and Not Living with Parent(s), Who has Custody? n/a  Is CPS involved or ever been involved? Never  Is APS involved or ever been involved? Never   Patient Determined To Be At Risk for Harm To Self or Others Based on Review of Patient Reported Information or Presenting Complaint? No  Method: No Plan  Availability of Means: No access or NA  Intent: Vague intent or  NA  Notification Required: No need or identified person  Additional Information for Danger to Others Potential: -- (n/a)  Additional Comments for Danger to Others Potential: n/a  Are There Guns or Other Weapons in Your Home? No  Types of Guns/Weapons: n/a  Are These Weapons Safely Secured?                            -- (n/a)  Who Could Verify You Are Able To Have These Secured: n/a  Do You Have any Outstanding Charges, Pending Court Dates, Parole/Probation? Current issues due to DWI, court date today got continued, per her report  Contacted To Inform of Risk of Harm To Self or Others: Other: Comment (n/a)    Does Patient Present under Involuntary Commitment? No    [place] of Residence:  [place]   Patient Currently Receiving the Following Services: Individual Therapy; Medication Management; CD--IOP (Intensive Chemical Dependency Program)   Determination of Need: Urgent (48 hours)   Options For Referral: Facility-Based Crisis     CCA Biopsychosocial Patient Reported Schizophrenia/Schizoaffective Diagnosis in Past: No   Strengths: Seeking Treatment   Mental Health Symptoms Depression:  Change in energy/activity; Fatigue; Hopelessness; Difficulty Concentrating; Increase/decrease in appetite; Irritability; Sleep (too much or little); Tearfulness; Worthlessness   Duration of Depressive symptoms: Duration of Depressive Symptoms: Greater than two weeks   Mania:  None   Anxiety:   Difficulty concentrating; Fatigue; Irritability; Tension; Worrying   Psychosis:  None   Duration of Psychotic symptoms:    Trauma:  N/A   Obsessions:  None   Compulsions:  None   Inattention:  None   Hyperactivity/Impulsivity:  Blurts out answers; Difficulty waiting turn; Fidgets with hands/feet   Oppositional/Defiant Behaviors:  None   Emotional Irregularity:  None   Other Mood/Personality Symptoms:  n/a    Mental Status Exam Appearance and self-care  Stature:  Average   Weight:  Average weight   Clothing:  -- (scrubs)   Grooming:  Normal   Cosmetic use:  None   Posture/gait:  Normal   Motor activity:  Tremor   Sensorium  Attention:  Normal   Concentration:  Variable   Orientation:  X5   Recall/memory:  Normal   Affect and Mood  Affect:  Appropriate   Mood:  Anxious   Relating  Eye contact:  Normal   Facial expression:  Responsive   Attitude toward examiner:  Cooperative   Thought and Language  Speech flow: Normal   Thought content:  Appropriate to Mood and Circumstances   Preoccupation:  None   Hallucinations:  None   Organization:  Coherent   [employer] of Knowledge:  Average   Intelligence:  Average    Abstraction:  Normal   Judgement:  Fair   Dance Movement Psychotherapist:  Adequate   Insight:  Fair   Decision Making:  Normal   Social Functioning  Social Maturity:  Irresponsible   Social Judgement:  Normal   Stress  Stressors:  Family conflict; Grief/losses; Housing; Illness; Transitions   Coping Ability:  Overwhelmed   Skill Deficits:  Decision making; Self-control; Interpersonal   Supports:  Support needed; Friends/Service system     Religion: Religion/Spirituality Are You A Religious Person?: Yes What is Your Religious Affiliation?: Christian How Might This Affect Treatment?: n/a  Leisure/Recreation: Leisure / Recreation Do You Have Hobbies?: Yes Leisure and Hobbies: walking, spending time with friends  Exercise/Diet: Exercise/Diet Do You Exercise?: No Have You Gained  or Lost A Significant Amount of Weight in the Past Six Months?: No Do You Follow a Special Diet?: No Do You Have Any Trouble Sleeping?: Yes Explanation of Sleeping Difficulties: difficulty sustaining sleep   CCA Employment/Education Employment/Work Situation: Employment / Work Situation Employment Situation: Unemployed Patient's Job has Been Impacted by Current Illness: No Has Patient ever Been in Equities Trader?: No  Education: Education Is Patient Currently Attending School?: No Last Grade Completed: 12 Did You Product Manager?: Yes What Type of College Degree Do you Have?: Bachelors Degree Did You Have An Individualized Education Program (IIEP): No Did You Have Any Difficulty At School?: No Patient's Education Has Been Impacted by Current Illness: No   CCA Family/Childhood History Family and Relationship History: Family history Does patient have children?: Yes How many children?: 3 How is patient's relationship with their children?: Reports unstable relationship with children, hasn't seen one of her children in 6 years per her report  Childhood History:  Childhood History By whom was/is  the patient raised?: Both parents Did patient suffer any verbal/emotional/physical/sexual abuse as a child?: No Did patient suffer from severe childhood neglect?: No Has patient ever been sexually abused/assaulted/raped as an adolescent or adult?: No Was the patient ever a victim of a crime or a disaster?: No Witnessed domestic violence?: No Has patient been affected by domestic violence as an adult?: No       CCA Substance Use Alcohol /Drug Use: Alcohol  / Drug Use Pain Medications: none Prescriptions: Medroxprogesterone 2.5 mg, Duloxetine  60mg , Gabapention 300,g tid, Amlodpine Besylate 2.5 qd, Ibuprofen  800mg  q 4 hrs prn, Proprananolol ER 120mg , Naltrexone  50 mh (2) qhs, Guanfacine  1mg  qhs, ASA 81 mh bid for 28 days, Estradiol  1 mg Over the Counter: none History of alcohol  / drug use?: Yes Longest period of sobriety (when/how long): 9 months durring pregnancy x 3, 1.5-2weeks otherwise Negative Consequences of Use: Personal relationships, Legal Withdrawal Symptoms: Tremors, Sweats                         ASAM's:  Six Dimensions of Multidimensional Assessment  Dimension 1:  Acute Intoxication and/or Withdrawal Potential:   Dimension 1:  Description of individual's past and current experiences of substance use and withdrawal: detoxed when in medical hospital prior to surgery  Dimension 2:  Biomedical Conditions and Complications:   Dimension 2:  Description of patient's biomedical conditions and  complications: none  Dimension 3:  Emotional, Behavioral, or Cognitive Conditions and Complications:  Dimension 3:  Description of emotional, behavioral, or cognitive conditions and complications: has depression and anxiety ( substance induced)  Dimension 4:  Readiness to Change:  Dimension 4:  Description of Readiness to Change criteria: wants to change  Dimension 5:  Relapse, Continued use, or Continued Problem Potential:  Dimension 5:  Relapse, continued use, or continued problem  potential critiera description: has used for 30 years daily. last use was today. Wants to stop  Dimension 6:  Recovery/Living Environment:  Dimension 6:  Recovery/Iiving environment criteria description: Lives with a roommate  ASAM Severity Score: ASAM's Severity Rating Score: 9  ASAM Recommended Level of Treatment: ASAM Recommended Level of Treatment: Level II Intensive Outpatient Treatment   Substance use Disorder (SUD) Substance Use Disorder (SUD)  Checklist Symptoms of Substance Use: Continued use despite having a persistent/recurrent physical/psychological problem caused/exacerbated by use, Continued use despite persistent or recurrent social, interpersonal problems, caused or exacerbated by use, Persistent desire or unsuccessful efforts to cut down or control use,  Large amounts of time spent to obtain, use or recover from the substance(s), Recurrent use that results in a failure to fulfill major role obligations (work, school, home), Evidence of tolerance, Presence of craving or strong urge to use, Substance(s) often taken in larger amounts or over longer times than was intended, Repeated use in physically hazardous situations  Recommendations for Services/Supports/Treatments: Recommendations for Services/Supports/Treatments Recommendations For Services/Supports/Treatments: CD-IOP Intensive Chemical Dependency Program, Facility Based Crisis  Disposition Recommendation per psychiatric provider: [facility]   DSM5 Diagnoses: Patient Active Problem List   Diagnosis Date Noted   Closed fracture of left fibula and tibia 02/10/2024   Olecranon fracture-right with fixation 01/22/2024   Serum iron raised 12/23/2023   Abnormal SPEP 12/23/2023   Dizziness 12/23/2023   Alcohol  withdrawal syndrome without complication (HCC) 12/09/2023   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   Attention and concentration deficit 10/29/2023   Neuropathy of both feet 05/09/2023   Angio-edema 09/24/2022    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     Referrals to Alternative Service(s): Referred to Alternative Service(s):   Place:   Date:   Time:    Referred to Alternative Service(s):   Place:   Date:   Time:    Referred to Alternative Service(s):   Place:   Date:   Time:    Referred to Alternative Service(s):   Place:   Date:   Time:     Latroya Ng C Meeyah Ovitt, LCMHCA

## 2024-07-01 NOTE — ED Notes (Addendum)
 Pt admitted to [facility] this shift. VS stable, no reports of pain. Pt denies SI/HI/AVH. Pt compliant with scheduled meds; prn ativan  given. Skin check preformed, see flowsheets. She was anxious, cooperative, fidgety, and restless. Pt resting in bed, no acute distress noted. Respirations even and unlabored. Continue to monitor for safety.

## 2024-07-01 NOTE — Telephone Encounter (Signed)
 Dashawn calls this a,m, and leaves a message asking this therapist or [name] to call her. This therapist calls Briar and she answers. Therapist confirms she is speaking to the correct person by obtaining two verifiers.  Cassiopeia says she fell off the wagon and has started drinking again.  She says she is not drinking that much but does not quantify the amount. She says she is waking up with the shakes and is afraid she is going to have a seizure.  She says she only drinks in the morning to relieve her shaking.    Autumnrose says she already knows what she needs to learn about addiction so she just needs to detox. She asks if this therapist needs to inform [facility] that she wants to come to detox Therapist informs her there is no need to notify anyone as [facility] is open 24/7 and she can present at any time. She says she plans to come this morning.  [name], MS, LMFT, LCAS 07-01-24

## 2024-07-02 ENCOUNTER — Encounter (HOSPITAL_COMMUNITY): Payer: Self-pay

## 2024-07-02 MED ORDER — HYDROXYZINE HCL 25 MG PO TABS
25.0000 mg | ORAL_TABLET | Freq: Four times a day (QID) | ORAL | Status: DC | PRN
  Administered 2024-07-02: 25 mg via ORAL
  Filled 2024-07-02: qty 1

## 2024-07-02 MED ORDER — LOPERAMIDE HCL 2 MG PO CAPS: 2.0000 mg | ORAL_CAPSULE | ORAL | Status: DC | PRN

## 2024-07-02 MED ORDER — FOLIC ACID 1 MG PO TABS
1.0000 mg | ORAL_TABLET | Freq: Every day | ORAL | Status: DC
  Administered 2024-07-02 – 2024-07-04 (×3): 1 mg via ORAL
  Filled 2024-07-02 (×3): qty 1

## 2024-07-02 MED ORDER — PROPRANOLOL HCL 10 MG PO TABS
10.0000 mg | ORAL_TABLET | Freq: Two times a day (BID) | ORAL | Status: DC
  Administered 2024-07-02 – 2024-07-04 (×5): 10 mg via ORAL
  Filled 2024-07-02 (×5): qty 1

## 2024-07-02 MED ORDER — ADULT MULTIVITAMIN W/MINERALS CH: 1.0000 | ORAL_TABLET | Freq: Every day | ORAL | Status: DC

## 2024-07-02 MED ORDER — ADULT MULTIVITAMIN W/MINERALS CH
1.0000 | ORAL_TABLET | Freq: Every day | ORAL | Status: DC
  Administered 2024-07-03 – 2024-07-04 (×2): 1 via ORAL
  Filled 2024-07-02 (×2): qty 1

## 2024-07-02 MED ORDER — THIAMINE MONONITRATE 100 MG PO TABS
100.0000 mg | ORAL_TABLET | Freq: Every day | ORAL | Status: DC
  Administered 2024-07-03 – 2024-07-04 (×2): 100 mg via ORAL
  Filled 2024-07-02 (×2): qty 1

## 2024-07-02 MED ORDER — CETIRIZINE HCL 10 MG PO TABS
10.0000 mg | ORAL_TABLET | Freq: Every day | ORAL | Status: DC
  Administered 2024-07-02 – 2024-07-03 (×2): 10 mg via ORAL
  Filled 2024-07-02 (×2): qty 1

## 2024-07-02 MED ORDER — CYANOCOBALAMIN 1000 MCG/ML IJ SOLN
1000.0000 ug | Freq: Once | INTRAMUSCULAR | Status: DC
  Filled 2024-07-02: qty 1

## 2024-07-02 MED ORDER — MEDROXYPROGESTERONE ACETATE 2.5 MG PO TABS
2.5000 mg | ORAL_TABLET | Freq: Every day | ORAL | Status: DC
  Administered 2024-07-02 – 2024-07-04 (×3): 2.5 mg via ORAL
  Filled 2024-07-02 (×4): qty 1

## 2024-07-02 MED ORDER — ONDANSETRON 4 MG PO TBDP
4.0000 mg | ORAL_TABLET | Freq: Four times a day (QID) | ORAL | Status: DC | PRN
  Administered 2024-07-03: 15:00:00 4 mg via ORAL
  Filled 2024-07-02: qty 1

## 2024-07-02 MED ORDER — ESTRADIOL 0.5 MG PO TABS
1.0000 mg | ORAL_TABLET | Freq: Every day | ORAL | Status: DC
  Administered 2024-07-02 – 2024-07-04 (×3): 1 mg via ORAL
  Filled 2024-07-02 (×3): qty 2

## 2024-07-02 NOTE — Care Management (Signed)
 [facility] Care Management...   Patient requested CD-IOP  Writer coordinated an out patient appointment with [facility] IOP program on Wednesday 07/07/2024 @ 9:00 AM  [facility] [address] [place], [place]  [phone]   Patient will discharge to home..   [address] [place], [place] [address]  RN to arrange transportation  30 day scripts

## 2024-07-02 NOTE — ED Notes (Signed)
 Patient remains on unit with no acute changes noted at this time. Alert and oriented x3.  No current suicidal or homicidal ideation reported. No hallucinations or delusions observed. Patient is tolerating the milieu. Vitals within normal limits.

## 2024-07-02 NOTE — ED Provider Notes (Signed)
 Behavioral Health Progress Note  Date and Time: 07/02/2024 7:06 PM Name: Leah Jones MRN:  [id number]  Subjective:  Leah Jones 63 y.o., female patient presented to [facility] then admitted to [facility] for alcohol  detox on 07/01/24. UDS negative. Leah Jones, is seen face to face by this provider,  and chart reviewed on 07/02/2024.  Pt has PPHx of AUD, ADHD, MDD and GAD.   On evaluation Leah Jones reports that she did not sleep well last well last night due to having to wait in assessment room for almost 4 hours before transferring to the [facility]. She reports eating breakfast this morning. Pt continues to deny suicidal and homicidal ideations and psychotic symptoms. She reports treatment at [facility] in January 2025 then going to through a break up in June causing her to relapse on alcohol . She reports drinking about 6-7 White Claw drinks a day.  She reports current withdrawal symptoms including tremors (worsening of baseline essential tremors) and mild nausea. She denies hx of complicated withdrawal symptoms and withdrawal seizures. Pt is requesting to restart her home medications. She states that her family coming into for the holidays is what really pushed her to come get help with detox. She verbalizes hoping to discharge on Sunday with outpatient resources.  Most recent CIWA 3, scoring for tremors.   During evaluation Leah Jones is lying down in bed in no acute distress.  She is alert & oriented x 4, calm, cooperative and attentive for this assessment.  Her mood is anxious  with congruent affect.  She has normal speech, and behavior.  Objectively there is no evidence of psychosis/mania or delusional thinking. Pt does not appear to be responding to internal or external stimuli.  Patient is able to converse coherently, goal directed thoughts, no distractibility, or pre-occupation.  She also denies suicidal/self-harm/homicidal ideation, psychosis, and paranoia.  Patient answered assessment questions  appropriately.      Diagnosis:  Final diagnoses:  Alcohol  use disorder    Total Time spent with patient: 30 minutes  Past Psychiatric History: She has a history of ADD, alcohol  use disorder, MDD, GAD. She was previously treated fro AUD at [facility] in January. Pt currently receiving SAIOP upstairs at [facility].  Past Medical History:  Past Medical History:  Diagnosis Date   ADD (attention deficit disorder)    ? alcohol  related   Alcohol  dependence, daily use (HCC) 11/17/2018   Alcoholism (HCC)    per record   Allergic reaction 09/24/2022   ANAL FISSURE, HX OF 04/15/2007   Annotation: occasional rectal bleeding Qualifier: Diagnosis of  By: Amon MD, Aloysius BRAVO.    Angio-edema    B12 deficiency    Carpal tunnel syndrome    Carpal tunnel syndrome 12/01/2009   Qualifier: Diagnosis of  By: Amon MD, Jose E.    COVID-19 virus infection 08/23/2019   Diarrhea 11/17/2018   Discoloration of skin of foot 05/20/2014   Purplish discoloration top of L foot    Eczema    Eustachian tube dysfunction 01/25/2014   Mallet deformity of fifth finger, left, acquired 06/19/2015   Suicidal ideation    Urticaria     Family History:  Family History  Problem Relation Age of Onset   Stomach cancer Mother    Colon polyps Mother    Asthma Mother    Colon cancer Mother    Breast cancer Mother    Celiac disease Mother    Colon polyps Father    Alcoholism Father    Colon  polyps Sister    Asthma Sister    Anxiety disorder Sister    ADD / ADHD Sister    Colon polyps Brother    Bipolar disorder Brother    Pancreatic cancer Brother    Lung cancer Paternal Grandmother    Pancreatic cancer Paternal Grandfather    Asthma Daughter    Asthma Son    Eczema Son    Bipolar disorder Son    Diabetes Other    Hypertension Other    Esophageal cancer Neg Hx    Rectal cancer Neg Hx     Social History: Pt currently living with a friend and is unemployed. Currently dealing DWI charge. Pt has 3 children with  difficult relationship. Reports alcohol  use daily.   Additional Social History:                         Sleep: Fair  Appetite:  Good  Current Medications:  Current Facility-Administered Medications  Medication Dose Route Frequency Provider Last Rate Last Admin   alum & mag hydroxide-simeth (MAALOX/MYLANTA) 200-200-20 MG/5ML suspension 30 mL  30 mL Oral Q4H PRN Kapoor, Sahil, MD       cetirizine  (ZYRTEC ) tablet 10 mg  10 mg Oral QHS Freyja Govea C, NP       [START ON 07/03/2024] cyanocobalamin  (VITAMIN B12) injection 1,000 mcg  1,000 mcg Intramuscular Once Gottfried, Rhoda J, MD       estradiol  (ESTRACE ) tablet 1 mg  1 mg Oral Daily Lalita Ebel C, NP   1 mg at 07/02/24 1218   folic acid  (FOLVITE ) tablet 1 mg  1 mg Oral Daily Mandy Fitzwater C, NP   1 mg at 07/02/24 1218   gabapentin  (NEURONTIN ) capsule 300 mg  300 mg Oral TID Trudy Carwin, NP   300 mg at 07/02/24 1615   hydrOXYzine  (ATARAX ) tablet 25 mg  25 mg Oral Q6H PRN Addasyn Mcbreen C, NP       loperamide  (IMODIUM ) capsule 2-4 mg  2-4 mg Oral PRN Yaacov Koziol C, NP       LORazepam  (ATIVAN ) tablet 1-4 mg  1-4 mg Oral Q1H PRN Kapoor, Sahil, MD   1 mg at 07/02/24 1222   Or   LORazepam  (ATIVAN ) injection 1-4 mg  1-4 mg Intravenous Q1H PRN Kapoor, Sahil, MD       magnesium  hydroxide (MILK OF MAGNESIA) suspension 30 mL  30 mL Oral Daily PRN Kapoor, Sahil, MD       medroxyPROGESTERone (PROVERA) tablet 2.5 mg  2.5 mg Oral Daily Welford Christmas C, NP   2.5 mg at 07/02/24 1339   [START ON 07/03/2024] multivitamin with minerals tablet 1 tablet  1 tablet Oral Daily Maurisio Ruddy C, NP       OLANZapine (ZYPREXA) injection 10 mg  10 mg Intramuscular TID PRN Kapoor, Sahil, MD       OLANZapine (ZYPREXA) injection 5 mg  5 mg Intramuscular TID PRN Kapoor, Sahil, MD       OLANZapine zydis (ZYPREXA) disintegrating tablet 5 mg  5 mg Oral TID PRN Kapoor, Sahil, MD       ondansetron  (ZOFRAN -ODT) disintegrating tablet 4 mg  4 mg Oral Q6H PRN  Alekai Pocock C, NP       propranolol  (INDERAL ) tablet 10 mg  10 mg Oral BID Holton Sidman C, NP   10 mg at 07/02/24 1218   [START ON 07/03/2024] thiamine  (VITAMIN B1) tablet 100 mg  100 mg Oral Daily Thresa,  Alan BROCKS, NP       Current Outpatient Medications  Medication Sig Dispense Refill   atomoxetine  (STRATTERA ) 40 MG capsule Take 40 mg by mouth every morning.     baclofen  (LIORESAL ) 10 MG tablet Take 1 tablet (10 mg total) by mouth 3 (three) times daily. 90 tablet 1   cetirizine  (ZYRTEC ) 10 MG tablet Take 1 tablet (10 mg total) by mouth at bedtime. 90 tablet 3   EPINEPHrine  0.3 mg/0.3 mL IJ SOAJ injection Inject 0.3 mg into the muscle as needed for anaphylaxis. 2 each 1   estradiol  (ESTRACE ) 1 MG tablet Take 1 mg by mouth daily.     folic acid  (FOLVITE ) 1 MG tablet TAKE 1 TABLET BY MOUTH DAILY 30 tablet 0   gabapentin  (NEURONTIN ) 300 MG capsule Take 1 capsule (300 mg total) by mouth 3 (three) times daily. 90 capsule 5   MAGNESIUM  PO Take 1 tablet by mouth daily.     medroxyPROGESTERone (PROVERA) 2.5 MG tablet Take 2.5 mg by mouth daily.     propranolol  (INDERAL ) 10 MG tablet Take 10 mg by mouth 2 (two) times daily.     thiamine  (VITAMIN B1) 100 MG tablet Take 100 mg by mouth daily.     Vitamin D , Ergocalciferol , (DRISDOL ) 1.25 MG (50000 UNIT) CAPS capsule TAKE 1 CAPSULE BY MOUTH EVERY 7 DAYS (Patient taking differently: Take 50,000 Units by mouth every Monday.) 4 capsule 0    Labs  Lab Results:  Admission on 07/01/2024, Discharged on 07/01/2024  Component Date Value Ref Range Status   Sodium 07/01/2024 136  135 - 145 mmol/L Final   Potassium 07/01/2024 3.7  3.5 - 5.1 mmol/L Final   Chloride 07/01/2024 98  98 - 111 mmol/L Final   CO2 07/01/2024 24  22 - 32 mmol/L Final   Glucose, Bld 07/01/2024 140 (H)  70 - 99 mg/dL Final   Glucose reference range applies only to samples taken after fasting for at least 8 hours.   BUN 07/01/2024 5 (L)  8 - 23 mg/dL Final   Creatinine, Ser 07/01/2024  0.66  0.44 - 1.00 mg/dL Final   Calcium  07/01/2024 8.8 (L)  8.9 - 10.3 mg/dL Final   Total Protein 87/88/7974 7.1  6.5 - 8.1 g/dL Final   Albumin 87/88/7974 3.9  3.5 - 5.0 g/dL Final   AST 87/88/7974 25  15 - 41 U/L Final   ALT 07/01/2024 19  0 - 44 U/L Final   Alkaline Phosphatase 07/01/2024 58  38 - 126 U/L Final   Total Bilirubin 07/01/2024 1.0  0.0 - 1.2 mg/dL Final   GFR, Estimated 07/01/2024 >60  >60 mL/min Final   Comment: (NOTE) Calculated using the CKD-EPI Creatinine Equation (2021)    Anion gap 07/01/2024 14  5 - 15 Final   Performed at [facility], [address], [place], [place] [address]   Magnesium  07/01/2024 2.3  1.7 - 2.4 mg/dL Final   Performed at [facility], [address], [place], [place] [address]   Phosphorus 07/01/2024 3.7  2.5 - 4.6 mg/dL Final   Performed at [facility], [address] [place], [place] [address]   WBC 07/01/2024 5.4  4.0 - 10.5 K/uL Final   RBC 07/01/2024 4.14  3.87 - 5.11 MIL/uL Final   Hemoglobin 07/01/2024 13.3  12.0 - 15.0 g/dL Final   HCT 87/88/7974 39.8  36.0 - 46.0 % Final   MCV 07/01/2024 96.1  80.0 - 100.0 fL  Final   MCH 07/01/2024 32.1  26.0 - 34.0 pg Final   MCHC 07/01/2024 33.4  30.0 - 36.0 g/dL Final   RDW 87/88/7974 14.2  11.5 - 15.5 % Final   Platelets 07/01/2024 292  150 - 400 K/uL Final   nRBC 07/01/2024 0.0  0.0 - 0.2 % Final   Performed at [facility], [address] [place], [place] [address]   POC Amphetamine  UR 07/01/2024 None Detected  NONE DETECTED (Cut Off Level 1000 ng/mL) Final   POC Secobarbital (BAR) 07/01/2024 None Detected  NONE DETECTED (Cut Off Level 300 ng/mL) Final   POC Buprenorphine (BUP) 07/01/2024 None Detected  NONE DETECTED (Cut Off Level 10 ng/mL) Final   POC Oxazepam (BZO) 07/01/2024 None Detected  NONE DETECTED (Cut Off Level 300 ng/mL) Final   POC Cocaine UR 07/01/2024 None Detected  NONE DETECTED (Cut Off Level 300 ng/mL) Final   POC Methamphetamine UR 07/01/2024  None Detected  NONE DETECTED (Cut Off Level 1000 ng/mL) Final   POC Morphine 07/01/2024 None Detected  NONE DETECTED (Cut Off Level 300 ng/mL) Final   POC Methadone UR 07/01/2024 None Detected  NONE DETECTED (Cut Off Level 300 ng/mL) Final   POC Oxycodone UR 07/01/2024 None Detected  NONE DETECTED (Cut Off Level 100 ng/mL) Final   POC Marijuana UR 07/01/2024 None Detected  NONE DETECTED (Cut Off Level 50 ng/mL) Final   Color, Urine 07/01/2024 STRAW (A)  YELLOW Final   APPearance 07/01/2024 CLEAR  CLEAR Final   Specific Gravity, Urine 07/01/2024 1.002 (L)  1.005 - 1.030 Final   pH 07/01/2024 6.0  5.0 - 8.0 Final   Glucose, UA 07/01/2024 NEGATIVE  NEGATIVE mg/dL Final   Hgb urine dipstick 07/01/2024 SMALL (A)  NEGATIVE Final   Bilirubin Urine 07/01/2024 NEGATIVE  NEGATIVE Final   Ketones, ur 07/01/2024 NEGATIVE  NEGATIVE mg/dL Final   Protein, ur 87/88/7974 NEGATIVE  NEGATIVE mg/dL Final   Nitrite 87/88/7974 NEGATIVE  NEGATIVE Final   Leukocytes,Ua 07/01/2024 NEGATIVE  NEGATIVE Final   RBC / HPF 07/01/2024 0-5  0 - 5 RBC/hpf Final   WBC, UA 07/01/2024 0-5  0 - 5 WBC/hpf Final   Bacteria, UA 07/01/2024 FEW (A)  NONE SEEN Final   Squamous Epithelial / HPF 07/01/2024 0-5  0 - 5 /HPF Final   Mucus 07/01/2024 PRESENT   Final   Performed at [facility], [address] [place], [place] [address]   Folate 07/01/2024 13.6  >5.9 ng/mL Final   Performed at [facility], [address], [place], [place] [address]   Vitamin B-12 07/01/2024 177 (L)  180 - 914 pg/mL Final   Comment: (NOTE) This assay is not validated for testing neonatal or myeloproliferative syndrome specimens for Vitamin B12 levels. Performed at [facility], [address] [place], [place] [address]   Scanned Document on 04/28/2024  Component Date Value Ref Range Status   Creatinine, POC 04/09/2024 40.0  mg/dL Final   ABS BY HIM   Creatinine, POC 04/14/2024 63.0  mg/dL Final   ABS BY HIM  Scanned Document  on 04/22/2024  Component Date Value Ref Range Status   Creatinine, POC 03/08/2024 34  mg/dL Final   Abstracted by HIM  Office Visit on 02/10/2024  Component Date Value Ref Range Status   Vitamin B-12 02/10/2024 590  211 - 911 pg/mL Final   Folate 02/10/2024 17.8  >5.9 ng/mL Final   Magnesium  02/10/2024 1.8  1.5 -  2.5 mg/dL Final   VITD 92/77/7974 20.50 (L)  30.00 - 100.00 ng/mL Final   Vitamin B1 (Thiamine ) 02/10/2024 16  8 - 30 nmol/L Final   Comment: (Note) Vitamin supplementation within 24 hours prior to blood  draw may affect the accuracy of the results. . This test was developed and its analytical performance  characteristics have been determined by [employer]. It has not been cleared or approved by FDA.  This assay has been validated pursuant to the CLIA  regulations and is used for clinical purposes. . [facility] [address] [place] [address] [phone] Johanna Agent L. Gino, MD, PhD     Blood Alcohol  level:  Lab Results  Component Value Date   ETH 112 (H) 03/10/2015    Metabolic Disorder Labs: Lab Results  Component Value Date   HGBA1C 4.9 03/05/2022   MPG 82 12/12/2020   No results found for: PROLACTIN Lab Results  Component Value Date   CHOL 189 03/05/2022   TRIG 64.0 03/05/2022   HDL 85.30 03/05/2022   CHOLHDL 2 03/05/2022   VLDL 12.8 03/05/2022   LDLCALC 91 03/05/2022   LDLCALC 78 12/12/2020    Therapeutic Lab Levels: No results found for: LITHIUM No results found for: VALPROATE No results found for: CBMZ  Physical Findings   AIMS    Flowsheet Row [facility]  AIMS Total Score 0   AUDIT    Flowsheet Row [facility]  Alcohol  Use Disorder Identification Test Final Score (AUDIT) 4   GAD-7    Flowsheet Row Office Visit from 11/14/2023 in  [facility] [place] Office Visit from 12/10/2022 in [facility] [place] Office Visit from 11/20/2022 in [facility] [place] Office Visit from 09/14/2019 in [facility] [place] Office Visit from 04/22/2019 in [facility]  Total GAD-7 Score 5 0 3 12 7    PHQ2-9    Flowsheet Row ED from 07/01/2024 in [facility] Office Visit from 11/14/2023 in [facility] [place] Office Visit from 12/10/2022 in [facility] [place] Office Visit from 11/20/2022 in [facility] [place] Office Visit from 03/05/2022 in [facility] [place]  PHQ-2 Total Score 3 2 0 0 0  PHQ-9 Total Score 17 12 0 10 --   Flowsheet Row ED from 07/01/2024 in [facility] Most recent reading at 07/01/2024  9:45 PM ED from 07/01/2024 in [facility] Most recent reading at 07/01/2024  5:23 PM ED from 08/23/2022 in [facility] at [facility] Most recent reading at 08/23/2022  6:01 PM  C-SSRS RISK CATEGORY No Risk No Risk No Risk     Musculoskeletal  Strength & Muscle Tone: within normal limits Gait & Station: normal Patient leans: N/A  Psychiatric Specialty Exam  Presentation  General Appearance:  Appropriate for Environment  Eye Contact: Good  Speech: Clear and Coherent  Speech Volume: Normal  Handedness: Right   Mood and Affect  Mood: Anxious  Affect: Appropriate   Thought Process  Thought Processes: Coherent; Linear  Descriptions of Associations:Intact  Orientation:Full (Time, Place and Person)  Thought Content:WDL; Logical  Diagnosis of Schizophrenia or Schizoaffective disorder  in past: No    Hallucinations:Hallucinations: None  Ideas of Reference:None  Suicidal Thoughts:Suicidal  Thoughts: No  Homicidal Thoughts:Homicidal Thoughts: No   Sensorium  Memory: Immediate Good; Recent Good  Judgment: Fair  Insight: Fair   Art Therapist  Concentration: Good  Attention Span: Good  Recall: Good  Fund of Knowledge: Good  Language: Good   Psychomotor Activity  Psychomotor Activity: Psychomotor Activity: Tremor   Assets  Assets: Desire for Improvement; Housing; Physical Health; Social Support; Manufacturing Systems Engineer; Financial Resources/Insurance; Resilience   Sleep  Sleep: Sleep: Fair  Estimated Sleeping Duration (Last 24 Hours): 12.50-13.50 hours  Nutritional Assessment (For OBS and FBC admissions only) Has the patient had a weight loss or gain of 10 pounds or more in the last 3 months?: No Has the patient had a decrease in food intake/or appetite?: No Does the patient have dental problems?: No Does the patient have eating habits or behaviors that may be indicators of an eating disorder including binging or inducing vomiting?: No Has the patient recently lost weight without trying?: 0 Has the patient been eating poorly because of a decreased appetite?: 0 Malnutrition Screening Tool Score: 0    Physical Exam  Physical Exam Vitals and nursing note reviewed.  Constitutional:      Appearance: Normal appearance.  HENT:     Head: Normocephalic.     Nose: Nose normal.  Eyes:     Extraocular Movements: Extraocular movements intact.  Cardiovascular:     Rate and Rhythm: Normal rate.  Pulmonary:     Effort: Pulmonary effort is normal.  Musculoskeletal:        General: Normal range of motion.     Cervical back: Normal range of motion.  Neurological:     General: No focal deficit present.     Mental Status: She is alert and oriented to person, place, and time.    Review of Systems  Constitutional: Negative.   HENT: Negative.    Eyes: Negative.   Respiratory: Negative.    Cardiovascular: Negative.   Gastrointestinal:   Positive for nausea.  Genitourinary: Negative.   Musculoskeletal:  Positive for myalgias.  Neurological:  Positive for tremors.  Endo/Heme/Allergies: Negative.   Psychiatric/Behavioral:  Positive for substance abuse.    Blood pressure 138/87, pulse 89, temperature 98 F (36.7 C), temperature source Oral, resp. rate 16, last menstrual period 04/18/2019, SpO2 98%. There is no height or weight on file to calculate BMI.  Treatment Plan Summary: Daily contact with patient to assess and evaluate symptoms and progress in treatment and Medication management Pt will continue alcohol  detox in the [facility]. Pt reports drinking about 6 White Claw mixed drinks daily.   Reports current withdrawal symptoms of worsening tremors. Pt reports having a baseline tremor for which she is prescribed Propranolol  10mg  BID for. She reports some mild nausea but was able to eat breakfast this morning.   Pt is hoping to discharge on Sunday if w/d symptoms have improved. She is looking for outpatient resources and plans to restart program with SAIOP upstairs at [facility].   Discussed pt's home medications and continued:  - Folic acid  1mg  daily - Propranolol  10mg  BID for essential tremor - Progesterone  2.5mg  daily for menopause symptoms - Estradiol  1mg  daily for menopause symptoms - Zyrtec  10mg  at bedtime for seasonal allergies  Continue CIWA with PRN Ativan  coverage and added supportive medications including: - Thiamine  100mg  daily - Multivitamin daily  - Hydroxyzine  25mg  q6h PRN for anxiety - Loperamide  2-4mg   PRN for  diarrhea  - Zofran  4mg  q6h PRN for nausea  Ethanol level was not ordered upon admission. Attempted to order as an add-on and RN confirmed with lab this morning. Around 1800, results still were not in. RN called lab for update and said it actually could not be done as an add-on. Too late to order a new Ethanol blood draw as pt has metabolized alcohol  at this time.    Alan JAYSON Mcardle, NP 07/02/2024 7:06  PM

## 2024-07-02 NOTE — ED Notes (Signed)
 Patient walking around unit, calm and cooperative, requested new scrubs for AM shower, no complaints, will continue to monitor for safety

## 2024-07-02 NOTE — Group Note (Signed)
 Group Topic: Decisional Balance/Substance Abuse  Group Date: 07/02/2024 Start Time: 1100 End Time: 1200 Facilitators: Necola Bluestein, LCSW  Department: [facility]  Number of Participants: 2  Group Focus: acceptance, anger management, art therapy, chemical dependency issues, co-dependency, communication, depression, dual diagnosis, goals/reality orientation, problem solving, relapse prevention, and substance abuse education Treatment Modality:  Art Therapy Interventions utilized were group exercise and patient education Purpose: enhance coping skills, express feelings, express irrational fears, improve communication skills, relapse prevention strategies, and trigger / craving management  Writer explored with the group an Art exercise called  Masking My Identity.   Clients will create masks to show how they see themselves versus how they think others see them. This activity is excellent for self-reflection, identity exploration, and emotional expression.  This activity utilized MI and CBT on how they want to increase self worth and how substance abuse causes them to mask their true identities.  The activity also explored triggers and self-worth tools to regain a sense of love and purpose.   Name: Leah Jones Date of Birth: Oct 28, 1960  MR: [id number]    Level of Participation: active Quality of Participation: cooperative, engaged, and initiates communication Interactions with others: gave feedback Mood/Affect: anxious, brightens with interaction, positive, and tearful Triggers (if applicable): family doesn't support her, current DWI charge, lonely Cognition: goal directed, insightful, and logical Progress: Significant Response: client was open in group, attentive, and wants change.  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

## 2024-07-02 NOTE — ED Notes (Signed)
 Pt sleeping in bed, no acute distress noted. Respirations even and unlabored. Continue to monitor for safety.

## 2024-07-02 NOTE — [facility] Group Notes (Signed)
 Spiritual Care and Counseling Group Note  07/02/2024 2:45pm  Facilitated by: Librada Donnice Lin   Type of Therapy and Topic:  Hope    Participation Level:  Active  Description of Group:  Group focused on topic of hope.  Patients participated in facilitated discussion around topic, connecting with one another around experiences and definitions for hope.  Group members engaged with group word cloud.  Members selected an image of what hope looks like for them today.  Group engaged in discussion around how their definitions of hope are present today in hospital.    Summary of Patient Progress:  Present throughout group.  Attentive and engaged in group discussion.  Responses were on topic   Therapeutic Modalities: Psycho-social ed, Adlerian, Narrative, MI   Librada Donnice Lin, Chaplain 07/02/2024 4:10 PM

## 2024-07-02 NOTE — Care Management (Signed)
 Interdisciplinary Treatment and Diagnostic Plan Update  07/02/2024 Time of Session: 2pm Leah Jones MRN: [id number]  Diagnosis:  Final diagnoses:  None     Current Medications:  Current Facility-Administered Medications  Medication Dose Route Frequency Provider Last Rate Last Admin   alum & mag hydroxide-simeth (MAALOX/MYLANTA) 200-200-20 MG/5ML suspension 30 mL  30 mL Oral Q4H PRN Kapoor, Sahil, MD       cetirizine  (ZYRTEC ) tablet 10 mg  10 mg Oral QHS Brent, Amanda C, NP       estradiol  (ESTRACE ) tablet 1 mg  1 mg Oral Daily Brent, Amanda C, NP   1 mg at 07/02/24 1218   folic acid  (FOLVITE ) tablet 1 mg  1 mg Oral Daily Brent, Amanda C, NP   1 mg at 07/02/24 1218   gabapentin  (NEURONTIN ) capsule 300 mg  300 mg Oral TID Trudy Carwin, NP   300 mg at 07/02/24 9072   hydrOXYzine  (ATARAX ) tablet 25 mg  25 mg Oral Q6H PRN Brent, Amanda C, NP       loperamide  (IMODIUM ) capsule 2-4 mg  2-4 mg Oral PRN Brent, Amanda C, NP       LORazepam  (ATIVAN ) tablet 1-4 mg  1-4 mg Oral Q1H PRN Kapoor, Sahil, MD   1 mg at 07/02/24 1222   Or   LORazepam  (ATIVAN ) injection 1-4 mg  1-4 mg Intravenous Q1H PRN Kapoor, Sahil, MD       magnesium  hydroxide (MILK OF MAGNESIA) suspension 30 mL  30 mL Oral Daily PRN Kapoor, Sahil, MD       medroxyPROGESTERone (PROVERA) tablet 2.5 mg  2.5 mg Oral Daily Brent, Amanda C, NP   2.5 mg at 07/02/24 1339   [START ON 07/03/2024] multivitamin with minerals tablet 1 tablet  1 tablet Oral Daily Brent, Amanda C, NP       OLANZapine (ZYPREXA) injection 10 mg  10 mg Intramuscular TID PRN Kapoor, Sahil, MD       OLANZapine (ZYPREXA) injection 5 mg  5 mg Intramuscular TID PRN Kapoor, Sahil, MD       OLANZapine zydis (ZYPREXA) disintegrating tablet 5 mg  5 mg Oral TID PRN Kapoor, Sahil, MD       ondansetron  (ZOFRAN -ODT) disintegrating tablet 4 mg  4 mg Oral Q6H PRN Brent, Amanda C, NP       propranolol  (INDERAL ) tablet 10 mg  10 mg Oral BID Brent, Amanda C, NP   10 mg at 07/02/24  1218   [START ON 07/03/2024] thiamine  (VITAMIN B1) tablet 100 mg  100 mg Oral Daily Brent, Amanda C, NP       Current Outpatient Medications  Medication Sig Dispense Refill   atomoxetine  (STRATTERA ) 40 MG capsule Take 40 mg by mouth every morning.     baclofen  (LIORESAL ) 10 MG tablet Take 1 tablet (10 mg total) by mouth 3 (three) times daily. 90 tablet 1   cetirizine  (ZYRTEC ) 10 MG tablet Take 1 tablet (10 mg total) by mouth at bedtime. 90 tablet 3   EPINEPHrine  0.3 mg/0.3 mL IJ SOAJ injection Inject 0.3 mg into the muscle as needed for anaphylaxis. 2 each 1   estradiol  (ESTRACE ) 1 MG tablet Take 1 mg by mouth daily.     folic acid  (FOLVITE ) 1 MG tablet TAKE 1 TABLET BY MOUTH DAILY 30 tablet 0   gabapentin  (NEURONTIN ) 300 MG capsule Take 1 capsule (300 mg total) by mouth 3 (three) times daily. 90 capsule 5   MAGNESIUM  PO Take 1 tablet by mouth daily.  medroxyPROGESTERone (PROVERA) 2.5 MG tablet Take 2.5 mg by mouth daily.     propranolol  (INDERAL ) 10 MG tablet Take 10 mg by mouth 2 (two) times daily.     thiamine  (VITAMIN B1) 100 MG tablet Take 100 mg by mouth daily.     Vitamin D , Ergocalciferol , (DRISDOL ) 1.25 MG (50000 UNIT) CAPS capsule TAKE 1 CAPSULE BY MOUTH EVERY 7 DAYS (Patient taking differently: Take 50,000 Units by mouth every Monday.) 4 capsule 0   PTA Medications: Prior to Admission medications  Medication Sig Start Date End Date Taking? Authorizing Provider  atomoxetine  (STRATTERA ) 40 MG capsule Take 40 mg by mouth every morning. 11/28/23  Yes [provider]  baclofen  (LIORESAL ) 10 MG tablet Take 1 tablet (10 mg total) by mouth 3 (three) times daily. 03/03/24 03/03/25 Yes Kober, Charles E, PA-C  cetirizine  (ZYRTEC ) 10 MG tablet Take 1 tablet (10 mg total) by mouth at bedtime. 10/29/23  Yes Kuneff, Renee A, DO  EPINEPHrine  0.3 mg/0.3 mL IJ SOAJ injection Inject 0.3 mg into the muscle as needed for anaphylaxis. 08/23/22  Yes Charlyn Sora, MD  estradiol  (ESTRACE ) 1 MG  tablet Take 1 mg by mouth daily. 09/15/18  Yes [provider]  folic acid  (FOLVITE ) 1 MG tablet TAKE 1 TABLET BY MOUTH DAILY 05/17/24  Yes Kuneff, Renee A, DO  gabapentin  (NEURONTIN ) 300 MG capsule Take 1 capsule (300 mg total) by mouth 3 (three) times daily. 12/24/23  Yes Kuneff, Renee A, DO  MAGNESIUM  PO Take 1 tablet by mouth daily.   Yes [provider]  medroxyPROGESTERone (PROVERA) 2.5 MG tablet Take 2.5 mg by mouth daily.   Yes [provider]  propranolol  (INDERAL ) 10 MG tablet Take 10 mg by mouth 2 (two) times daily. 02/06/24  Yes [provider]  thiamine  (VITAMIN B1) 100 MG tablet Take 100 mg by mouth daily. 02/07/24  Yes [provider]  Vitamin D , Ergocalciferol , (DRISDOL ) 1.25 MG (50000 UNIT) CAPS capsule TAKE 1 CAPSULE BY MOUTH EVERY 7 DAYS Patient taking differently: Take 50,000 Units by mouth every Monday. 05/17/24  Yes Kuneff, Renee A, DO    Patient Stressors: Financial difficulties   Health problems   Occupational concerns   Substance abuse    Patient Strengths: Ability for insight  Capable of independent living  Communication skills  Motivation for treatment/growth   Treatment Modalities: Medication Management, Group therapy, Case management,  1 to 1 session with clinician, Psychoeducation, Recreational therapy.   Physician Treatment Plan for Primary and Secondary Diagnosis:  Final diagnoses:  None   Long Term Goal(s):    Short Term Goals:    Medication Management: Evaluate patient's response, side effects, and tolerance of medication regimen.  Therapeutic Interventions: 1 to 1 sessions, Unit Group sessions and Medication administration.  Evaluation of Outcomes: Progressing  LCSW Treatment Plan for Primary Diagnosis:  Final diagnoses:  None    Long Term Goal(s): Safe transition to appropriate next level of care at discharge.  Short Term Goals: Facilitate acceptance of mental health diagnosis and concerns  through verbal commitment to aftercare plan and appointments at discharge. and Patient will attend AA/NA groups as scheduled.  Therapeutic Interventions: Assess for all discharge needs, 1 to 1 time with Child psychotherapist, Explore available resources and support systems, Assess for adequacy in community support network, Educate family and significant other(s) on suicide prevention, Complete Psychosocial Assessment, Interpersonal group therapy.  Evaluation of Outcomes: Progressing   Progress in Treatment: Attending groups: Yes. Participating in groups: Yes. Taking medication  as prescribed: Yes. Toleration medication: As evidenced by:  medication/withdrawals.  Family/Significant other contact made: Yes, individual(s) contacted:  child/friend Patient understands diagnosis: Yes. Discussing patient identified problems/goals with staff: Yes. Medical problems stabilized or resolved: Yes. Denies suicidal/homicidal ideation: Yes. Issues/concerns per patient self-inventory: No. Other: n/a  New problem(s) identified: No, Describe:  n/a  New Short Term/Long Term Goal(s): detox and enter into CD-IOP  Patient Goals:  Regain sobriety and monitor withdrawal symptoms.  Client reports anxiety and jittery feelings.    Discharge Plan or Barriers:  Client will detox and go to CD-IOP on 07/07/24 at 9am   Reason for Continuation of Hospitalization: Anxiety Medical Issues Medication stabilization Withdrawal symptoms  Estimated Length of Stay: 07/01/24 - 07/07/24  Last 3 [place] Suicide Severity Risk Score: Flowsheet Row ED from 07/01/2024 in [facility] Most recent reading at 07/01/2024  9:45 PM ED from 07/01/2024 in [facility] Most recent reading at 07/01/2024  5:23 PM ED from 08/23/2022 in [facility] Emergency Department at [facility] Most recent reading at 08/23/2022  6:01 PM  C-SSRS RISK CATEGORY No Risk No Risk No Risk    Last PHQ  2/9 Scores:    07/01/2024    8:43 PM 11/14/2023   10:50 AM 12/10/2022    2:09 PM  Depression screen PHQ 2/9  Decreased Interest 1 1   Down, Depressed, Hopeless 2 1 0  PHQ - 2 Score 3 2 0  Altered sleeping 3 2 0  Tired, decreased energy 3 2 0  Change in appetite 2 3 0  Feeling bad or failure about yourself  1 1 0  Trouble concentrating 3 2 0  Moving slowly or fidgety/restless 2 0 0  Suicidal thoughts 0 0 0  PHQ-9 Score 17 12  0   Difficult doing work/chores  Not difficult at all Not difficult at all     Data saved with a previous flowsheet row definition    Scribe for Treatment Team: Rhegan Trunnell, LCSW 07/02/2024 3:18 PM

## 2024-07-02 NOTE — Care Management (Signed)
 [facility] Care Management...  Writer met with the client to discuss discharge planning.  Writer explained to the client the average stay is 3-5 days.  Client reports after she leaves [facility] she's only interested in outpatient therapy.  Client reports she previously attended CD-IOP and would like to re-enroll.  Client has an appointment at [facility] with [facility] on 07/07/24.  Client reports earlier this year attending [facility] for alcohol  issues.  Client reports receiving an DWI earlier this year and has a court date for 08/01/24.  Client reports he case has been continued multiple times.    Client reports on average she consumes 6 white claws per day and the last time she used was yesterday morning.  Client reports CD-IOP would work best for her because she's also looking for employment.  Client reports she currently has the jitters, shakes, and hunger symptoms related to withdrawals.    Client declined residential treatment but the Writer will also place additional outpatient services in her AVS.

## 2024-07-02 NOTE — ED Notes (Signed)
 Pt did attend [facility].

## 2024-07-02 NOTE — Group Note (Signed)
 Group Topic: Recovery Basics  Group Date: 07/02/2024 Start Time: 2015 End Time: 2115 Facilitators: Nathanael Moulder, NT  Department: [facility]  Number of Participants: 6  Group Focus: community group, coping skills, relapse prevention, and social skills Treatment Modality:  Psychoeducation and Skills Training Interventions utilized were support Purpose: express feelings, relapse prevention strategies  Name: Leah Jones Date of Birth: 01/06/1961  MR: [id number]    Level of Participation: active Quality of Participation: attentive Interactions with others: appropriate Mood/Affect: appropriate Triggers (if applicable): N/A Cognition: coherent/clear Progress: Gaining insight Response: Appropriate Plan: patient will be encouraged to attend future groups  Patients Problems:  Patient Active Problem List   Diagnosis Date Noted   Alcohol  use disorder 07/01/2024   Closed fracture of left fibula and tibia 02/10/2024   Olecranon fracture-right with fixation 01/22/2024   Serum iron raised 12/23/2023   Abnormal SPEP 12/23/2023   Dizziness 12/23/2023   Alcohol  withdrawal syndrome without complication (HCC) 12/09/2023   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

## 2024-07-02 NOTE — Group Note (Signed)
 Group Topic: Healthy Self Image and Positive Change  Group Date: 07/02/2024 Start Time: 1300 End Time: 1345 Facilitators: Alyse Leilani LABOR, NT  Department: [facility]  Number of Participants: 6  Group Focus: chemical dependency education, chemical dependency issues, clarity of thought, reality orientation, and relapse prevention Treatment Modality:  Psychoeducation Interventions utilized were group exercise Purpose: enhance coping skills, express feelings, improve communication skills, and increase insight  Name: Leah Jones Date of Birth: 11/29/1960  MR: [id number]    Level of Participation: moderate Quality of Participation: restless Interactions with others: she was sleeping Mood/Affect: restless Triggers (if applicable): none Cognition: no insight Progress: None Response: listened Plan: patient will be encouraged to stay awake in group.  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

## 2024-07-02 NOTE — Group Note (Signed)
 Group Topic: Relaxation  Group Date: 07/02/2024 Start Time: 1200 End Time: 1230 Facilitators: Reinhold Minor BROCKS, RN  Department: [facility]  Number of Participants: 6  Group Focus: relaxation Treatment Modality:  Psychoeducation Interventions utilized were patient education Purpose: reinforce self-care  Name: Leah Jones Date of Birth: 17-Jan-1961  MR: [id number]    Level of Participation: minimal Quality of Participation: cooperative Interactions with others: gave feedback Mood/Affect: appropriate Triggers (if applicable): NA Cognition: goal directed Progress: Moderate Response: NA Plan: patient will be encouraged to practice relaxation techniques regularly  Patients Problems:  Patient Active Problem List   Diagnosis Date Noted   Alcohol  use disorder 07/01/2024   Closed fracture of left fibula and tibia 02/10/2024   Olecranon fracture-right with fixation 01/22/2024   Serum iron raised 12/23/2023   Abnormal SPEP 12/23/2023   Dizziness 12/23/2023   Alcohol  withdrawal syndrome without complication (HCC) 12/09/2023   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

## 2024-07-02 NOTE — Discharge Instructions (Addendum)
 [facility] Care Management...   Base on the information you have provided and the presenting issue, outpatient services, mental health therapy and medication management services have been recommended.  It is imperative that you follow through with treatment recommendations within 5-7 days from the of discharge to mitigate further risk to your safety and mental well-being. In case of an urgent crisis, you may contact the Mobile Crisis Unit with [facility] at [phone].   Patient requested CD-IOP   Writer coordinated an out patient appointment with [facility] IOP program on Wednesday 07/07/2024 @ 9:00 AM   [facility] [address] [place], [place]  [phone]    Patient will discharge to home..    [address] [place], [place] [address]   RN to arrange transportation   30 day scripts

## 2024-07-02 NOTE — ED Notes (Signed)
 Patient denies SI/HI, VSS, complaints of feeling unsteady. Patient took all scheduled medications and PRN for anxiety. No complaints of pain. Patient did attend AA meeting and engaged. Patient resting at this time. Will continue to monitor for safety

## 2024-07-03 DIAGNOSIS — F1090 Alcohol use, unspecified, uncomplicated: Secondary | ICD-10-CM | POA: Diagnosis present

## 2024-07-03 DIAGNOSIS — E538 Deficiency of other specified B group vitamins: Secondary | ICD-10-CM | POA: Diagnosis not present

## 2024-07-03 MED ORDER — NAPROXEN 375 MG PO TABS
375.0000 mg | ORAL_TABLET | Freq: Two times a day (BID) | ORAL | Status: DC | PRN
  Administered 2024-07-03 – 2024-07-04 (×3): 375 mg via ORAL
  Filled 2024-07-03 (×3): qty 1

## 2024-07-03 MED ORDER — BACLOFEN 10 MG PO TABS
10.0000 mg | ORAL_TABLET | Freq: Three times a day (TID) | ORAL | Status: DC
  Administered 2024-07-03 – 2024-07-04 (×4): 10 mg via ORAL
  Filled 2024-07-03 (×4): qty 1

## 2024-07-03 NOTE — Group Note (Signed)
 Group Topic: Relapse and Recovery  Group Date: 07/03/2024 Start Time: 0830 End Time: 0900 Facilitators: Belle Camellia SAILOR, RN  Department: [facility]  Number of Participants: 8  Group Focus: substance abuse education Treatment Modality:  Solution-Focused Therapy Interventions utilized were patient education Purpose: relapse prevention strategies  Name: Leah Jones Date of Birth: [birth year]  MR: [id number]    Level of Participation: minimal Quality of Participation: attentive Interactions with others: gave feedback Mood/Affect: appropriate Triggers (if applicable):  Cognition: goal directed Progress: Minimal Response:  Plan: referral / recommendations  Patients Problems:  Patient Active Problem List   Diagnosis Date Noted   Alcohol  use disorder 07/01/2024   Closed fracture of left fibula and tibia 02/10/2024   Olecranon fracture-right with fixation 01/22/2024   Serum iron raised 12/23/2023   Abnormal SPEP 12/23/2023   Dizziness 12/23/2023   Alcohol  withdrawal syndrome without complication (HCC) 12/09/2023   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

## 2024-07-03 NOTE — Progress Notes (Signed)
 Pt awake and alert x 3 . Reports she is still feeling a little shaky,  pt encouraged to get up slowly and call for staff if she feels unsteady on her feet.

## 2024-07-03 NOTE — Group Note (Signed)
 Group Topic: Recovery Basics  Group Date: 07/03/2024 Start Time: 2000 End Time: 2030 Facilitators: Verdon Jacqualyn BRAVO, NT  Department: [facility]  Number of Participants: 9  Group Focus: chemical dependency issues Treatment Modality:  Individual Therapy Interventions utilized were group exercise Purpose: relapse prevention strategies  Name: Leah Jones Date of Birth: 07-19-1961  MR: [id number]    Level of Participation: none Quality of Participation: n/a Interactions with others: n/a Mood/Affect: n/a Triggers (if applicable): n/a Cognition: n/a Progress: None Response: n/a Plan: follow-up needed  Patients Problems:  Patient Active Problem List   Diagnosis Date Noted   Alcohol  use disorder 07/01/2024   Closed fracture of left fibula and tibia 02/10/2024   Olecranon fracture-right with fixation 01/22/2024   Serum iron raised 12/23/2023   Abnormal SPEP 12/23/2023   Dizziness 12/23/2023   Alcohol  withdrawal syndrome without complication (HCC) 12/09/2023   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

## 2024-07-03 NOTE — ED Notes (Signed)
 Pt states that chest pain has resolved.  Reports feeling some anxiety.  EKG performed as ordered and given to Dr. Lawrnce.

## 2024-07-03 NOTE — ED Notes (Signed)
 Pt denies SI/HI/AVH. Back pain managed with prn naproxen . Compliant with scheduled meds. Pt did not attend AA group this evening. She was anxious, cooperative, and preoccupied. No behavioral concerns at this time. Currently sleeping in bed, no acute distress noted. Respirations even and unlabored. Continue to monitor for safety.

## 2024-07-03 NOTE — Group Note (Signed)
 Group Topic: Relapse and Recovery  Group Date: 07/03/2024 Start Time: 0830 End Time: 0900 Facilitators: Belle Camellia SAILOR, RN  Department: [facility]  Number of Participants: 8  Group Focus: abuse issues Treatment Modality:  Solution-Focused Therapy Interventions utilized were problem solving Purpose: increase insight, relapse prevention strategies, and trigger / craving management  Name: Leah Jones Date of Birth: [birth year]  MR: [id number]    Level of Participation: withdrawn Quality of Participation: cooperative Interactions with others: minimal   Mood/Affect: appropriate Triggers (if applicable):  Cognition: no insight Progress: Minimal Response:   Plan: follow-up needed  Patients Problems:  Patient Active Problem List   Diagnosis Date Noted   Alcohol  use disorder 07/01/2024   Closed fracture of left fibula and tibia 02/10/2024   Olecranon fracture-right with fixation 01/22/2024   Serum iron raised 12/23/2023   Abnormal SPEP 12/23/2023   Dizziness 12/23/2023   Alcohol  withdrawal syndrome without complication (HCC) 12/09/2023   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

## 2024-07-03 NOTE — ED Provider Notes (Signed)
 Behavioral Health Progress Note  Date and Time: 07/03/2024 11:58 AM Name: Leah Jones MRN:  [id number]  Subjective:  Leah Jones 63 y.o., female patient presented to [facility] then admitted to [facility] for alcohol  detox on 07/01/24. UDS negative. BAL not obtained upon admission. Thera Basden, is seen face to face by this provider,  and chart reviewed on 07/03/2024.  Pt has PPHx of AUD, ADHD, MDD and GAD.   On evaluation Leah Jones reports that she slept the best I have slept in  along time. She has a good appetite but does not care for the food here too much. She continues to deny suicidal/ homicidal ideations and psychotic symptoms. She reports feeling much better today after getting some sleep and taking her propranolol . Pt raises arms out and only a slight tremor is observed, which pt states is her baseline. Pt does report having an episode of diarrhea this morning and feels that it is likely due to a combination of not drinking alcohol  and the food messing up her stomach. She is encouraged to drink fluids.  Pt inquires about continuing her Baclofen  10mg  TID which she was prescribed by provider upstairs at [facility] for AUD. She reports that this medication is more effective at decreasing her cravings and urges to drink than the Naltrexone  is. She denies having urges at this time but has thought about drinking at times. She is hopeful to discharge tomorrow and we discussed that possibility, if her withdrawal symptoms continue to improve and diarrhea resolves. Ativan / Librium taper were discussed yesterday but pt did not want to start it due to concerns for it extending her stay and pt did not feel that her symptoms were severe enough to require a taper. Pt states that she just wanted to be here to detox from alcohol  so that she could be around her family for the holidays in a sober state.   During evaluation Leah Jones is sitting up in bed in no acute distress.  She is alert & oriented x 4, calm, cooperative  and attentive for this assessment.  Her mood is euthymic with congruent affect.  She has normal speech, and behavior.  Objectively there is no evidence of psychosis/mania or delusional thinking. Pt does not appear to be responding to internal or external stimuli.  Patient is able to converse coherently, goal directed thoughts, no distractibility, or pre-occupation.  She also denies suicidal/self-harm/homicidal ideation, psychosis, and paranoia.  Patient answered assessment questions appropriately.      Diagnosis:  Final diagnoses:  Alcohol  use disorder  Low serum vitamin B12    Total Time spent with patient: 20 minutes  Past Psychiatric History: She has a history of ADD, alcohol  use disorder, MDD, GAD. She was previously treated fro AUD at [facility] in January. Pt currently receiving [facility] upstairs at [facility].  Past Medical History:  Past Medical History:  Diagnosis Date   ADD (attention deficit disorder)    ? alcohol  related   Alcohol  dependence, daily use (HCC) 11/17/2018   Alcoholism (HCC)    per record   Allergic reaction 09/24/2022   ANAL FISSURE, HX OF 04/15/2007   Annotation: occasional rectal bleeding Qualifier: Diagnosis of  By: Amon MD, Aloysius BRAVO.    Angio-edema    B12 deficiency    Carpal tunnel syndrome    Carpal tunnel syndrome 12/01/2009   Qualifier: Diagnosis of  By: Amon MD, Jose E.    COVID-19 virus infection 08/23/2019   Diarrhea 11/17/2018   Discoloration of skin of  foot 05/20/2014   Purplish discoloration top of L foot    Eczema    Eustachian tube dysfunction 01/25/2014   Mallet deformity of fifth finger, left, acquired 06/19/2015   Suicidal ideation    Urticaria     Family History:  Family History  Problem Relation Age of Onset   Stomach cancer Mother    Colon polyps Mother    Asthma Mother    Colon cancer Mother    Breast cancer Mother    Celiac disease Mother    Colon polyps Father    Alcoholism Father    Colon polyps Sister    Asthma Sister     Anxiety disorder Sister    ADD / ADHD Sister    Colon polyps Brother    Bipolar disorder Brother    Pancreatic cancer Brother    Lung cancer Paternal Grandmother    Pancreatic cancer Paternal Grandfather    Asthma Daughter    Asthma Son    Eczema Son    Bipolar disorder Son    Diabetes Other    Hypertension Other    Esophageal cancer Neg Hx    Rectal cancer Neg Hx     Social History: Pt currently living with a friend and is unemployed. Currently dealing DWI charge. Pt has 3 children with difficult relationship. Reports alcohol  use daily.   Additional Social History:                         Sleep: Fair  Appetite:  Good  Current Medications:  Current Facility-Administered Medications  Medication Dose Route Frequency Provider Last Rate Last Admin   alum & mag hydroxide-simeth (MAALOX/MYLANTA) 200-200-20 MG/5ML suspension 30 mL  30 mL Oral Q4H PRN Kapoor, Sahil, MD       cetirizine  (ZYRTEC ) tablet 10 mg  10 mg Oral QHS Mayci Haning C, NP   10 mg at 07/02/24 2129   cyanocobalamin  (VITAMIN B12) injection 1,000 mcg  1,000 mcg Intramuscular Once Gottfried, Rhoda J, MD       estradiol  (ESTRACE ) tablet 1 mg  1 mg Oral Daily Anupama Piehl C, NP   1 mg at 07/03/24 9077   folic acid  (FOLVITE ) tablet 1 mg  1 mg Oral Daily Trula Frede C, NP   1 mg at 07/03/24 9076   gabapentin  (NEURONTIN ) capsule 300 mg  300 mg Oral TID Trudy Carwin, NP   300 mg at 07/03/24 9077   hydrOXYzine  (ATARAX ) tablet 25 mg  25 mg Oral Q6H PRN Samara Stankowski C, NP   25 mg at 07/02/24 2138   loperamide  (IMODIUM ) capsule 2-4 mg  2-4 mg Oral PRN Ezra Denne C, NP       LORazepam  (ATIVAN ) tablet 1-4 mg  1-4 mg Oral Q1H PRN Kapoor, Sahil, MD   1 mg at 07/03/24 9074   Or   LORazepam  (ATIVAN ) injection 1-4 mg  1-4 mg Intravenous Q1H PRN Kapoor, Sahil, MD       magnesium  hydroxide (MILK OF MAGNESIA) suspension 30 mL  30 mL Oral Daily PRN Kapoor, Sahil, MD       medroxyPROGESTERone  (PROVERA ) tablet 2.5 mg   2.5 mg Oral Daily Geraldine Tesar C, NP   2.5 mg at 07/03/24 1017   multivitamin with minerals tablet 1 tablet  1 tablet Oral Daily Kellie Chisolm C, NP   1 tablet at 07/03/24 9076   naproxen  (NAPROSYN ) tablet 375 mg  375 mg Oral BID PRN Bobbitt, Shalon E, NP  375 mg at 07/03/24 0557   OLANZapine  (ZYPREXA ) injection 10 mg  10 mg Intramuscular TID PRN Kapoor, Sahil, MD       OLANZapine  (ZYPREXA ) injection 5 mg  5 mg Intramuscular TID PRN Kapoor, Sahil, MD       OLANZapine  zydis (ZYPREXA ) disintegrating tablet 5 mg  5 mg Oral TID PRN Kapoor, Sahil, MD       ondansetron  (ZOFRAN -ODT) disintegrating tablet 4 mg  4 mg Oral Q6H PRN Lorene Samaan C, NP       propranolol  (INDERAL ) tablet 10 mg  10 mg Oral BID Aleyda Gindlesperger C, NP   10 mg at 07/03/24 9076   thiamine  (VITAMIN B1) tablet 100 mg  100 mg Oral Daily Kathryn Cosby C, NP   100 mg at 07/03/24 9076   Current Outpatient Medications  Medication Sig Dispense Refill   atomoxetine  (STRATTERA ) 40 MG capsule Take 40 mg by mouth every morning.     baclofen  (LIORESAL ) 10 MG tablet Take 1 tablet (10 mg total) by mouth 3 (three) times daily. 90 tablet 1   cetirizine  (ZYRTEC ) 10 MG tablet Take 1 tablet (10 mg total) by mouth at bedtime. 90 tablet 3   EPINEPHrine  0.3 mg/0.3 mL IJ SOAJ injection Inject 0.3 mg into the muscle as needed for anaphylaxis. 2 each 1   estradiol  (ESTRACE ) 1 MG tablet Take 1 mg by mouth daily.     folic acid  (FOLVITE ) 1 MG tablet TAKE 1 TABLET BY MOUTH DAILY 30 tablet 0   gabapentin  (NEURONTIN ) 300 MG capsule Take 1 capsule (300 mg total) by mouth 3 (three) times daily. 90 capsule 5   MAGNESIUM  PO Take 1 tablet by mouth daily.     medroxyPROGESTERone  (PROVERA ) 2.5 MG tablet Take 2.5 mg by mouth daily.     propranolol  (INDERAL ) 10 MG tablet Take 10 mg by mouth 2 (two) times daily.     thiamine  (VITAMIN B1) 100 MG tablet Take 100 mg by mouth daily.     Vitamin D , Ergocalciferol , (DRISDOL ) 1.25 MG (50000 UNIT) CAPS capsule TAKE 1 CAPSULE  BY MOUTH EVERY 7 DAYS (Patient taking differently: Take 50,000 Units by mouth every Monday.) 4 capsule 0    Labs  Lab Results:  Admission on 07/01/2024, Discharged on 07/01/2024  Component Date Value Ref Range Status   Sodium 07/01/2024 136  135 - 145 mmol/L Final   Potassium 07/01/2024 3.7  3.5 - 5.1 mmol/L Final   Chloride 07/01/2024 98  98 - 111 mmol/L Final   CO2 07/01/2024 24  22 - 32 mmol/L Final   Glucose, Bld 07/01/2024 140 (H)  70 - 99 mg/dL Final   Glucose reference range applies only to samples taken after fasting for at least 8 hours.   BUN 07/01/2024 5 (L)  8 - 23 mg/dL Final   Creatinine, Ser 07/01/2024 0.66  0.44 - 1.00 mg/dL Final   Calcium  07/01/2024 8.8 (L)  8.9 - 10.3 mg/dL Final   Total Protein 87/88/7974 7.1  6.5 - 8.1 g/dL Final   Albumin 87/88/7974 3.9  3.5 - 5.0 g/dL Final   AST 87/88/7974 25  15 - 41 U/L Final   ALT 07/01/2024 19  0 - 44 U/L Final   Alkaline Phosphatase 07/01/2024 58  38 - 126 U/L Final   Total Bilirubin 07/01/2024 1.0  0.0 - 1.2 mg/dL Final   GFR, Estimated 07/01/2024 >60  >60 mL/min Final   Comment: (NOTE) Calculated using the CKD-EPI Creatinine Equation (2021)    Anion  gap 07/01/2024 14  5 - 15 Final   Performed at [facility], [address], [place], [place] [address]   Magnesium  07/01/2024 2.3  1.7 - 2.4 mg/dL Final   Performed at [facility], [address] [place], [place] [address]   Phosphorus 07/01/2024 3.7  2.5 - 4.6 mg/dL Final   Performed at [facility], [address] [place], [place] [address]   WBC 07/01/2024 5.4  4.0 - 10.5 K/uL Final   RBC 07/01/2024 4.14  3.87 - 5.11 MIL/uL Final   Hemoglobin 07/01/2024 13.3  12.0 - 15.0 g/dL Final   HCT 87/88/7974 39.8  36.0 - 46.0 % Final   MCV 07/01/2024 96.1  80.0 - 100.0 fL Final   MCH 07/01/2024 32.1  26.0 - 34.0 pg Final   MCHC 07/01/2024 33.4  30.0 - 36.0 g/dL Final   RDW 87/88/7974 14.2  11.5 - 15.5 % Final   Platelets 07/01/2024 292  150 - 400 K/uL  Final   nRBC 07/01/2024 0.0  0.0 - 0.2 % Final   Performed at [facility], [address] [place], [place] [address]   POC Amphetamine  UR 07/01/2024 None Detected  NONE DETECTED (Cut Off Level 1000 ng/mL) Final   POC Secobarbital (BAR) 07/01/2024 None Detected  NONE DETECTED (Cut Off Level 300 ng/mL) Final   POC Buprenorphine (BUP) 07/01/2024 None Detected  NONE DETECTED (Cut Off Level 10 ng/mL) Final   POC Oxazepam (BZO) 07/01/2024 None Detected  NONE DETECTED (Cut Off Level 300 ng/mL) Final   POC Cocaine UR 07/01/2024 None Detected  NONE DETECTED (Cut Off Level 300 ng/mL) Final   POC Methamphetamine UR 07/01/2024 None Detected  NONE DETECTED (Cut Off Level 1000 ng/mL) Final   POC Morphine 07/01/2024 None Detected  NONE DETECTED (Cut Off Level 300 ng/mL) Final   POC Methadone UR 07/01/2024 None Detected  NONE DETECTED (Cut Off Level 300 ng/mL) Final   POC Oxycodone UR 07/01/2024 None Detected  NONE DETECTED (Cut Off Level 100 ng/mL) Final   POC Marijuana UR 07/01/2024 None Detected  NONE DETECTED (Cut Off Level 50 ng/mL) Final   Color, Urine 07/01/2024 STRAW (A)  YELLOW Final   APPearance 07/01/2024 CLEAR  CLEAR Final   Specific Gravity, Urine 07/01/2024 1.002 (L)  1.005 - 1.030 Final   pH 07/01/2024 6.0  5.0 - 8.0 Final   Glucose, UA 07/01/2024 NEGATIVE  NEGATIVE mg/dL Final   Hgb urine dipstick 07/01/2024 SMALL (A)  NEGATIVE Final   Bilirubin Urine 07/01/2024 NEGATIVE  NEGATIVE Final   Ketones, ur 07/01/2024 NEGATIVE  NEGATIVE mg/dL Final   Protein, ur 87/88/7974 NEGATIVE  NEGATIVE mg/dL Final   Nitrite 87/88/7974 NEGATIVE  NEGATIVE Final   Leukocytes,Ua 07/01/2024 NEGATIVE  NEGATIVE Final   RBC / HPF 07/01/2024 0-5  0 - 5 RBC/hpf Final   WBC, UA 07/01/2024 0-5  0 - 5 WBC/hpf Final   Bacteria, UA 07/01/2024 FEW (A)  NONE SEEN Final   Squamous Epithelial / HPF 07/01/2024 0-5  0 - 5 /HPF Final   Mucus 07/01/2024 PRESENT   Final   Performed at [facility], [address], [place], [place] [address]   Folate 07/01/2024 13.6  >5.9 ng/mL Final   Performed at [facility], [address], [place], [place] [address]   Vitamin B-12 07/01/2024 177 (L)  180 - 914 pg/mL Final   Comment: (NOTE) This assay is not validated for testing neonatal  or myeloproliferative syndrome specimens for Vitamin B12 levels. Performed at [facility], [address] [place], [place] [address]   Scanned Document on 04/28/2024  Component Date Value Ref Range Status   Creatinine, POC 04/09/2024 40.0  mg/dL Final   ABS BY HIM   Creatinine, POC 04/14/2024 63.0  mg/dL Final   ABS BY HIM  Scanned Document on 04/22/2024  Component Date Value Ref Range Status   Creatinine, POC 03/08/2024 34  mg/dL Final   Abstracted by HIM  Office Visit on 02/10/2024  Component Date Value Ref Range Status   Vitamin B-12 02/10/2024 590  211 - 911 pg/mL Final   Folate 02/10/2024 17.8  >5.9 ng/mL Final   Magnesium  02/10/2024 1.8  1.5 - 2.5 mg/dL Final   VITD 92/77/7974 20.50 (L)  30.00 - 100.00 ng/mL Final   Vitamin B1 (Thiamine ) 02/10/2024 16  8 - 30 nmol/L Final   Comment: (Note) Vitamin supplementation within 24 hours prior to blood  draw may affect the accuracy of the results. . This test was developed and its analytical performance  characteristics have been determined by [employer]. It has not been cleared or approved by FDA.  This assay has been validated pursuant to the CLIA  regulations and is used for clinical purposes. . [facility] [address] [place] [address] [phone] Johanna Agent L. Gino, MD, PhD     Blood Alcohol  level:  Lab Results  Component Value Date   ETH 112 (H) 03/10/2015    Metabolic Disorder Labs: Lab Results  Component Value Date   HGBA1C 4.9 03/05/2022   MPG 82 12/12/2020   No results found for: PROLACTIN Lab Results  Component Value Date   CHOL 189 03/05/2022   TRIG 64.0 03/05/2022   HDL  85.30 03/05/2022   CHOLHDL 2 03/05/2022   VLDL 12.8 03/05/2022   LDLCALC 91 03/05/2022   LDLCALC 78 12/12/2020    Therapeutic Lab Levels: No results found for: LITHIUM No results found for: VALPROATE No results found for: CBMZ  Physical Findings   AIMS    Flowsheet Row [facility]  AIMS Total Score 0   AUDIT    Flowsheet Row Ad[facility]  Alcohol  Use Disorder Identification Test Final Score (AUDIT) 4   GAD-7    Flowsheet Row Office Visit from 11/14/2023 in [facility] [facility] [place] [place] Office Visit from 12/10/2022 in [facility] [facility] [place] Office Visit from 11/20/2022 in [facility] [place] Office Visit from 09/14/2019 in [facility] Office Visit from 04/22/2019 in [facility] [place] [facility]  Total GAD-7 Score 5 0 3 12 7    PHQ2-9    Flowsheet Row ED from 07/01/2024 in [facility] Office Visit from 11/14/2023 in [facility] [place] Office Visit from 12/10/2022 in [facility] [place] [facility] [place] Office Visit from 11/20/2022 in [facility] [place] [facility] [place] Office Visit from 03/05/2022 in [facility] Total Score 3 2 0 0 0  PHQ-9 Total Score 17 12 0 10 --   Flowsheet Row ED from 07/01/2024 in [facility] Most recent reading at 07/01/2024  9:45 PM ED from 07/01/2024 in [facility] Most recent reading at 07/01/2024  5:23 PM ED from 08/23/2022 in [facility] Emergency Department at [facility] Most recent reading at 08/23/2022  6:01 PM  C-SSRS RISK CATEGORY No Risk No Risk No Risk     Musculoskeletal  Strength & Muscle Tone: within normal limits Gait &  Station: normal Patient leans: N/A  Psychiatric Specialty Exam  Presentation  General Appearance:  Appropriate for Environment  Eye Contact: Good  Speech: Clear and Coherent  Speech Volume: Normal  Handedness: Right   Mood and Affect  Mood: Anxious  Affect: Appropriate   Thought Process  Thought Processes: Coherent; Linear  Descriptions of Associations:Intact  Orientation:Full (Time, Place and Person)  Thought Content:WDL; Logical  Diagnosis of Schizophrenia or Schizoaffective disorder in past: No    Hallucinations:Hallucinations: None  Ideas of Reference:None  Suicidal Thoughts:Suicidal Thoughts: No  Homicidal Thoughts:Homicidal Thoughts: No   Sensorium  Memory: Immediate Good; Recent Good  Judgment: Fair  Insight: Fair   Art Therapist  Concentration: Good  Attention Span: Good  Recall: Good  Fund of Knowledge: Good  Language: Good   Psychomotor Activity  Psychomotor Activity: Psychomotor Activity: Tremor   Assets  Assets: Desire for Improvement; Housing; Physical Health; Social Support; Manufacturing Systems Engineer; Financial Resources/Insurance; Resilience   Sleep  Sleep: Sleep: Fair  Estimated Sleeping Duration (Last 24 Hours): 13.50 hours  No data recorded   Physical Exam  Physical Exam Vitals and nursing note reviewed.  Constitutional:      Appearance: Normal appearance.  HENT:     Head: Normocephalic.     Nose: Nose normal.  Eyes:     Extraocular Movements: Extraocular movements intact.  Cardiovascular:     Rate and Rhythm: Normal rate.  Pulmonary:     Effort: Pulmonary effort is normal.  Musculoskeletal:        General: Normal range of motion.     Cervical back: Normal range of motion.  Neurological:     General: No focal deficit present.     Mental Status: She is alert and oriented to person, place, and time.    Review of Systems  Constitutional: Negative.   HENT: Negative.    Eyes:  Negative.   Respiratory: Negative.    Cardiovascular: Negative.   Gastrointestinal:  Positive for nausea.  Genitourinary: Negative.   Musculoskeletal:  Positive for myalgias.  Neurological:  Positive for tremors.  Endo/Heme/Allergies: Negative.   Psychiatric/Behavioral:  Positive for substance abuse.    Blood pressure (!) 113/90, pulse 89, temperature 98.6 F (37 C), temperature source Oral, resp. rate 18, last menstrual period 04/18/2019, SpO2 100%. There is no height or weight on file to calculate BMI.  Treatment Plan Summary: Daily contact with patient to assess and evaluate symptoms and progress in treatment and Medication management Pt will continue alcohol  detox in the [facility]. She was given PRN Ativan  1mg  this morning for a CIWA of 6 (tremor and anxiety). She reports improvement of tremor with continuing her Propranolol  10mg  BID and PRN Ativan  coverage. Reports one episode of loose stools. She denies ongoing nausea today and feels better than yesterday. Still looking to discharge home tomorrow if symptoms continue to improve. She understands that she will be reassessed tmrw and may be encouraged to continue treatment if withdrawal symptoms are still present. Pt aware of appointment at [facility] on Wednesday 07/03/2024 at 9:00am.  Labs reviewed: Vitamin B12 slightly low at 177. IM Vitamin B12 was ordered but pt refused it because she was worried that  it may interact with the Thiamine . Educated pt on differences of B12 and B1 but pt still declined.   Continued:  - Folic acid  1mg  daily - Propranolol  10mg  BID for essential tremor - Progesterone  2.5mg  daily for menopause symptoms - Estradiol  1mg  daily for menopause symptoms - Zyrtec  10mg  at bedtime for seasonal allergies  Continue CIWA with PRN Ativan  coverage and added supportive medications including: - Thiamine  100mg  daily - Multivitamin daily  - Loperamide  2-4mg   PRN for diarrhea  - Zofran  4mg  q6h PRN for nausea  Medication  changes: - Restart Baclofen  10mg  TID for AUD and cravings as previously prescribed - Discontinued Hydroxyzine  25mg  TID PRN to avoid oversedation    Alan JAYSON Mcardle, NP 07/03/2024 11:58 AM

## 2024-07-03 NOTE — ED Notes (Signed)
 PT  woke up from nap  and is c/o midsternal chest pain Reports it is radiating up into her jaw. denies Nausea,  no diaphoresis noted.  Provider made aware.

## 2024-07-03 NOTE — Group Note (Signed)
 Group Topic: Healthy Self Image and Positive Change  Group Date: 07/03/2024 Start Time: 1300 End Time: 1330 Facilitators: Laneta Renea POUR, NT  Department: [facility]  Number of Participants: 2  Group Focus: coping skills Treatment Modality:  Psychoeducation Interventions utilized were problem solving Purpose: enhance coping skills and increase insight  Name: Leah Jones Date of Birth: [birth year]  MR: [id number]    Did not attend group.  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

## 2024-07-03 NOTE — ED Notes (Signed)
 Patient resting in bed with eyes closed, respirations even and unlabored, no distress noted, sitter at doorway, will continue to monitor for safety

## 2024-07-03 NOTE — ED Notes (Signed)
 Pt resting in bed, no acute distress noted. Respirations even and unlabored. Continue to monitor for safety.

## 2024-07-04 MED ORDER — AMLODIPINE BESYLATE 2.5 MG PO TABS
2.5000 mg | ORAL_TABLET | Freq: Every day | ORAL | Status: DC
  Administered 2024-07-04: 2.5 mg via ORAL
  Filled 2024-07-04: qty 1

## 2024-07-04 MED ORDER — AMLODIPINE BESYLATE 2.5 MG PO TABS: 2.5000 mg | ORAL_TABLET | Freq: Every day | ORAL | 0 refills | Status: DC

## 2024-07-04 MED ORDER — ADULT MULTIVITAMIN W/MINERALS CH: 1.0000 | ORAL_TABLET | Freq: Every day | ORAL | 0 refills | Status: AC

## 2024-07-04 MED ORDER — CYANOCOBALAMIN 1000 MCG/ML IJ SOLN
1000.0000 ug | Freq: Once | INTRAMUSCULAR | Status: AC
  Administered 2024-07-04: 1000 ug via INTRAMUSCULAR
  Filled 2024-07-04: qty 1

## 2024-07-04 MED ORDER — LORAZEPAM 1 MG PO TABS
1.0000 mg | ORAL_TABLET | Freq: Once | ORAL | Status: AC
  Administered 2024-07-04: 1 mg via ORAL
  Filled 2024-07-04: qty 1

## 2024-07-04 MED ORDER — AMOXICILLIN-POT CLAVULANATE 500-125 MG PO TABS
1.0000 | ORAL_TABLET | Freq: Two times a day (BID) | ORAL | Status: DC
  Administered 2024-07-04: 15:00:00 1 via ORAL
  Filled 2024-07-04: qty 1

## 2024-07-04 MED ORDER — AMOXICILLIN-POT CLAVULANATE 500-125 MG PO TABS: 1.0000 | ORAL_TABLET | Freq: Two times a day (BID) | ORAL | Status: DC

## 2024-07-04 MED ORDER — AMOXICILLIN-POT CLAVULANATE 500-125 MG PO TABS: 1.0000 | ORAL_TABLET | Freq: Two times a day (BID) | ORAL | 0 refills | Status: AC

## 2024-07-04 NOTE — ED Notes (Signed)
 Pt continues to express desire for discharge AMA. Pt also expressed desire to provider to arranger transportation with her son Norleen.

## 2024-07-04 NOTE — ED Notes (Signed)
 Pt c/o ear pain/ feeling like she has fluid in ears earlier this AM, provider notified. Pt c/o not being able to have earrings, became tearful, says being admitted to facility is giving her anxiety and makes her want to drink. RN reviewed contraband policy to ensure safety of everyone on the unit.

## 2024-07-04 NOTE — Group Note (Signed)
 Group Topic: Recovery Basics  Group Date: 07/04/2024 Start Time: 1300 End Time: 1345 Facilitators: Alyse Leilani LABOR, NT  Department: [facility]  Number of Participants: 8  Group Focus: relapse prevention, self-awareness, self-esteem, and social skills Treatment Modality:  Patient-Centered Therapy Interventions utilized were clarification, patient education, and problem solving Purpose: enhance coping skills, express feelings, increase insight, regain self-worth, and reinforce self-care  Name: Leah Jones Date of Birth: 1961-03-30  MR: [id number]    Pt did not attend group Patients Problems:  Patient Active Problem List   Diagnosis Date Noted   Alcohol  use disorder 07/01/2024   Closed fracture of left fibula and tibia 02/10/2024   Olecranon fracture-right with fixation 01/22/2024   Serum iron raised 12/23/2023   Abnormal SPEP 12/23/2023   Dizziness 12/23/2023   Alcohol  withdrawal syndrome without complication (HCC) 12/09/2023   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

## 2024-07-04 NOTE — ED Notes (Signed)
 Patient discharged AMA. AVS reviewed, no questions at this time. Patient belongings returned complete and intact. Patient escorted to lobby for transport to their destination. Safety maintained.

## 2024-07-04 NOTE — ED Notes (Signed)
 RN reviewed AVS including follow up care and medications. Further questions denied.

## 2024-07-04 NOTE — ED Notes (Signed)
 Pt c/o bilateral ear pain, prn naproxen  given. Pt denies si hi avh. Pt ate breakfast. Pt c/o anxiety, RN reviewed medications, questions denied. Pt seems somewhat forgetful, having to repeat information discussed within the past few min.

## 2024-07-04 NOTE — ED Notes (Signed)
 Pt sleeping in bed, no acute distress noted. Respirations even and unlabored. Continue to monitor for safety.

## 2024-07-04 NOTE — Group Note (Signed)
 Group Topic: Understanding Self  Group Date: 07/04/2024 Start Time: 1210 End Time: 1245 Facilitators: Daved Tinnie HERO, RN  Department: [facility]  Number of Participants: 8  Group Focus: psychiatric education Treatment Modality:  Psychoeducation Interventions utilized were exploration Purpose: increase insight  Name: Leah Jones Date of Birth: 16-Jan-1961  MR: [id number]    Level of Participation: when cued Quality of Participation: cooperative Interactions with others: gave feedback Mood/Affect: anxious Triggers (if applicable): n/a Cognition: coherent/clear Progress: Gaining insight Response: pt reported 'getting of sleep' as a self care/ protective factor that would be helpful Plan: patient will be encouraged to attend future RN education groups  Patients Problems:  Patient Active Problem List   Diagnosis Date Noted   Alcohol  use disorder 07/01/2024   Closed fracture of left fibula and tibia 02/10/2024   Olecranon fracture-right with fixation 01/22/2024   Serum iron raised 12/23/2023   Abnormal SPEP 12/23/2023   Dizziness 12/23/2023   Alcohol  withdrawal syndrome without complication (HCC) 12/09/2023   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

## 2024-07-04 NOTE — ED Provider Notes (Cosign Needed Addendum)
 FBC/OBS ASAP Discharge Summary  Date and Time: 07/04/2024 15:34 PM Name: [facility] MRN:  [id number]  HPI: [facility] 63 y.o., female patient presented to [facility] then admitted to [facility] for alcohol  detox on 07/01/24. UDS negative. BAL not obtained upon admission.  Subjective:[facility], is seen face to face by this provider and chart reviewed on 07/04/2024. Pt has a psychiatric hx of AUD, ADHD, MDD and GAD. On evaluation, patient is lying down on her bed. She is alert and oriented x 4. Calm and cooperative during her assessment. She reports good sleep last night and an okay appetite reporting that she doesn't like the food much here. Reports that yesterday afternoon she started having pain primarily in her right ear. She has had an ear ache in her right ear since this am. She feels like their is pressure behind her right ear due to fluid and that it may burst from the pressure. She reports that her right ear was popping yesterday and she hasn't been able to hear well. Reports a past hx of ear infections requiring ear drops and antibiotics. Chart review also shows a hx of eustachian tube dysfunction. She also reports some uneasiness last night due to discomfort in her right ear. Assessed both ears with otoscope. Right ear is erythematous with pus, no swelling, or fluid observed. Left ear is normal with no redness, drainage, or swelling. Started amoxicillin -clavulanate (augmentin ) 500-125 mg per tablet, 1 tablet by mouth bid for acute otitis media x 7 days. Her mood is anxious and irritable and her affect is congruent with her mood. Her speech is clear, coherent, and logical. Objectively, there is no evidence of psychosis, mania, or delusional thinking. She doesn't appear to be responding to internal or external stimuli. She is able to converse coherently with goal-directed thoughts, no distractibility, or pre-occupation. She denies suicidal and homicidal ideations. She denies psychosis and paranoia.   She  denies any withdrawal related symptoms for alcohol  use. CIWA at 0620 was a 4. CIWA at 11:59 am was a 1. On assessment, patient does have a mild tremor when her arms are extended. Also reports that her voice has been shaky. She also complaints of an upset stomach and her equilibrium being off due to pressure in her ear. Patient is adamant about being discharged home today to her son despite being provided education that discharge is not recommended at this time due to the patient's ongoing elevated blood pressure, active withdrawal symptoms, and hx of delirium tremens when cessation occurred 2 weeks ago. She is still at an elevated risk for withdrawal complications which requires close medical monitoring and management in an inpatient setting. [facility] admission continues to be recommended for monitoring of withdrawal symptoms, blood pressure normalization, and to reduce risk of severe withdrawal. Discussed with patient the option to sign a 72 hour request for discharge form that would go into affect once she signs it, but she is not agreeable to this because it wouldn't allow her to be discharged today. Patient signed the AMA form around 1437 pm.   Reports that her plan upon discharge is to continue AA meetings with her sponsors[facility]. Reports that she relapsed on alcohol  use 2 weeks ago due to boyfriend and holiday issues. Since July, she reports having attending the CD[facility] upstairs at [facility] but then stopped because she thought she had moved on. She is not interested in resuming sessions with the [facility] at [facility] because of 3 hour commitments 3  days a week. She reports on December 18th, she will start attending a substance abuse treatment [facility] at [facility] with [facility] and then will help be in charge for it in January. Discussed importance of establishing care with a new PCP for ongoing management of HTN which could be secondary to alcohol  use as well and acute otitis media.    Case discussed with [facility]. [facility] spoke with patient about her blood pressure. She reports a remote history of taking blood pressure medication. She is very focused on her ears and wanting to leave. She says friends are going to worried about her because she is not at church.  She will accept B12. She needs a primary doctor. Her's stopped taking her insurance and she has not found one yet. She mentioned leaving AMA. Medication hx reveals patient being prescribed amlodipine  2.5 mg back in July 18th, 2025. Amlodipine  2.5 mg by mouth once daily for HTN restarted.   Discussed discharge instructions with patient's son [facility]) at 1520 after obtaining verbal consent from her. He is agreeable to pick up the patient today and help ensure she follows up with her appointment this week at [facility]. He reports that his Mom has been secretive about her stay here and only wanted to stay 3 days. Shared with her son that she informed me that only her son and 2 best friends knew about her stay and was worried that her other family members were concerned since she didn't inform them.  Stay Summary: H&P note from 07/01/2024 by Dr. Jacqualin [facility]: [name] is a 63 year old female with a history of ADD, AUD, MDD, GAD who presented to the [facility] today for alcohol  detox. She reported that over the past 1 month she has been drinking every now and then my body starts shaking, heart rate increases so I drink again .  Reported that she has been experiencing some withdrawals that includes shaking, tachycardia and nausea.  As per the reason she stated that holiday is coming, me and my boyfriend broke up, my living situation, I fell and broke my elbow . Stated that I need detox .  Reported that she has been to the [facility] for 100 days in July and was doing therapy for substance use at the [facility].  Stated that since drinking every morning she has been having shakes, jitteriness and  trembling voice. Reported that her longest petro sobriety has been 1 month but was unable to give details about how long has she been drinking.  She denied any seizure-like activity or any history of it.  She denied any active or passive SI/HI/AVH.  She reported disturbed sleep due to alcohol  and my sons car broke down and I have to take him to work every morning at 5:30 AM .  She also reported poor appetite due to the ongoing stressors and alcohol  use.  When asked about depression it situational, no depression , and anxiety it situational as well .  She denied any symptoms of mania or psychosis. Reported that in the past she has been on lithium and Prozac for mood stabilization it made me mad , and also on trazodone , Adderall, buspirone , propranolol  and Atarax  but stated that she has not been taking depression or anxiety medicines for years now. Discussed about admitting her for detox at the [facility], patient amenable to the plan.  07/02/2024: Medications and note below from Alan Mcardle, NP - Folic acid  1mg  daily - Propranolol  10mg  BID for  essential tremor - Progesterone  2.5mg  daily for menopause symptoms - Estradiol  1mg  daily for menopause symptoms - Zyrtec  10mg  at bedtime for seasonal allergies Continue CIWA with PRN Ativan  coverage and added supportive medications including: - Thiamine  100mg  daily - Multivitamin daily  - Hydroxyzine  25mg  q6h PRN for anxiety - Loperamide  2-4mg   PRN for diarrhea  - Zofran  4mg  q6h PRN for nausea Ethanol level was not ordered upon admission. Attempted to order as an add-on and RN confirmed with lab this morning. Around 1800, results still were not in. RN called lab for update and said it actually could not be done as an add-on. Too late to order a new Ethanol blood draw as pt has metabolized alcohol  at this time.   07/03/2024 note from Alan Mcardle, NP: Treatment recommendations were: Pt will continue alcohol  detox in the [facility]. She was given PRN Ativan  1mg  this  morning for a CIWA of 6 (tremor and anxiety). She reports improvement of tremor with continuing her Propranolol  10mg  BID and PRN Ativan  coverage. Reports one episode of loose stools. She denies ongoing nausea today and feels better than yesterday. Still looking to discharge home tomorrow if symptoms continue to improve. She understands that she will be reassessed tmrw and may be encouraged to continue treatment if withdrawal symptoms are still present. Pt aware of appointment at [facility] for CDIOP on Wednesday 07/03/2024 at 9:00am. Labs reviewed: Vitamin B12 slightly low at 177. IM Vitamin B12 was ordered but pt refused it because she was worried that it may interact with the Thiamine . Educated pt on differences of B12 and B1 but pt still declined.  Medication changes: - Restart Baclofen  10mg  TID for AUD and cravings as previously prescribed - Discontinued Hydroxyzine  25mg  TID PRN to avoid oversedation  07/04/2024 changes: -started amlodipine  2.5 mg by mouth once daily for HTN (blood pressure at 9:17 am was 170/100, 11:52 am was 140/110, and 14:47 was 150/105-asymptomatic) -started amoxicillin -clavulanate (augmentin ) 500-125 mg 1 tablet by mouth bid x 7 days for acute otitis media -received one time vitamin B12 injection 1,000 mcg for low vitamin B12 -once time dose of ativan  1 mg by mouth for anxiety/alcohol  withdrawal symptoms  Denies side effects from medications. Patient has tolerated her medications well without any adverse effects.  Diagnosis:  Final diagnoses:  Alcohol  use disorder  Low serum vitamin B12    Total Time spent with patient: 45 minutes  Past Psychiatric History: She has a history of ADD, alcohol  use disorder, MDD, and GAD. She was previously treated for AUD at [facility] in [facility]. Pt is currently [facility]  Past Medical History:  Past Medical History:  Diagnosis Date   ADD (attention deficit disorder)    ? alcohol  related   Alcohol   dependence, daily use (HCC) 11/17/2018   Alcoholism (HCC)    per record   Allergic reaction 09/24/2022   ANAL FISSURE, HX OF 04/15/2007   Annotation: occasional rectal bleeding Qualifier: Diagnosis of  By: Amon MD, Aloysius BRAVO.    Angio-edema    B12 deficiency    Carpal tunnel syndrome    Carpal tunnel syndrome 12/01/2009   Qualifier: Diagnosis of  By: Amon MD, Jose E.    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 Social History: Patient is currently living with a friend and is unemployed. Currently dealing with DWI charge. Patient has 3 children with difficult relationship. Reports alcohol  use daily.  Sleep: Good  Appetite:  Good  Tobacco Cessation:  N/A, patient does not currently use tobacco products Current Medications:  Current Facility-Administered Medications  Medication Dose Route Frequency Provider Last Rate Last Admin   alum & mag hydroxide-simeth (MAALOX/MYLANTA) 200-200-20 MG/5ML suspension 30 mL  30 mL Oral Q4H PRN Kapoor, Sahil, MD       baclofen  (LIORESAL ) tablet 10 mg  10 mg Oral TID Brent, Amanda C, NP   10 mg at 07/04/24 9081   cetirizine  (ZYRTEC ) tablet 10 mg  10 mg Oral QHS Brent, Amanda C, NP   10 mg at 07/03/24 2125   cyanocobalamin  (VITAMIN B12) injection 1,000 mcg  1,000 mcg Intramuscular Once Gottfried, Rhoda J, MD       estradiol  (ESTRACE ) tablet 1 mg  1 mg Oral Daily Brent, Amanda C, NP   1 mg at 07/04/24 9083   folic acid  (FOLVITE ) tablet 1 mg  1 mg Oral Daily Brent, Amanda C, NP   1 mg at 07/04/24 9081   gabapentin  (NEURONTIN ) capsule 300 mg  300 mg Oral TID Trudy Carwin, NP   300 mg at 07/04/24 9081   loperamide  (IMODIUM ) capsule 2-4 mg  2-4 mg Oral PRN Brent, Amanda C,  NP       LORazepam  (ATIVAN ) tablet 1-4 mg  1-4 mg Oral Q1H PRN Kapoor, Sahil, MD   1 mg at 07/03/24 1713   Or   LORazepam  (ATIVAN ) injection 1-4 mg  1-4 mg Intravenous Q1H PRN Kapoor, Sahil, MD       magnesium  hydroxide (MILK OF MAGNESIA) suspension 30 mL  30 mL Oral Daily PRN Kapoor, Sahil, MD       medroxyPROGESTERone  (PROVERA ) tablet 2.5 mg  2.5 mg Oral Daily Brent, Amanda C, NP   2.5 mg at 07/04/24 0920   multivitamin with minerals tablet 1 tablet  1 tablet Oral Daily Brent, Amanda C, NP   1 tablet at 07/04/24 9081   naproxen  (NAPROSYN ) tablet 375 mg  375 mg Oral BID PRN Bobbitt, Shalon E, NP   375 mg at 07/04/24 0841   OLANZapine  (ZYPREXA ) injection 10 mg  10 mg Intramuscular TID PRN Kapoor, Sahil, MD       OLANZapine  (ZYPREXA ) injection 5 mg  5 mg Intramuscular TID PRN Kapoor, Sahil, MD       OLANZapine  zydis (ZYPREXA ) disintegrating tablet 5 mg  5 mg Oral TID PRN Kapoor, Sahil, MD       ondansetron  (ZOFRAN -ODT) disintegrating tablet 4 mg  4 mg Oral Q6H PRN Brent, Amanda C, NP   4 mg at 07/03/24 1507   propranolol  (INDERAL ) tablet 10 mg  10 mg Oral BID Brent, Amanda C, NP   10 mg at 07/04/24 9082   thiamine  (VITAMIN B1) tablet 100 mg  100 mg Oral Daily Brent, Amanda C, NP   100 mg at 07/04/24 9082   Current Outpatient Medications  Medication Sig Dispense Refill   atomoxetine  (STRATTERA ) 40 MG capsule Take 40 mg by mouth every morning.     baclofen  (LIORESAL ) 10 MG tablet Take 1 tablet (10 mg total) by mouth 3 (three) times daily. 90 tablet 1   cetirizine  (ZYRTEC ) 10 MG tablet Take 1 tablet (10 mg total) by mouth at bedtime. 90 tablet 3  EPINEPHrine  0.3 mg/0.3 mL IJ SOAJ injection Inject 0.3 mg into the muscle as needed for anaphylaxis. 2 each 1   estradiol  (ESTRACE ) 1 MG tablet Take 1 mg by mouth daily.     folic acid  (FOLVITE ) 1 MG tablet TAKE 1 TABLET BY MOUTH DAILY 30 tablet 0   gabapentin  (NEURONTIN ) 300 MG capsule Take 1 capsule (300 mg total) by mouth 3 (three) times daily. 90  capsule 5   MAGNESIUM  PO Take 1 tablet by mouth daily.     medroxyPROGESTERone  (PROVERA ) 2.5 MG tablet Take 2.5 mg by mouth daily.     propranolol  (INDERAL ) 10 MG tablet Take 10 mg by mouth 2 (two) times daily.     thiamine  (VITAMIN B1) 100 MG tablet Take 100 mg by mouth daily.     Vitamin D , Ergocalciferol , (DRISDOL ) 1.25 MG (50000 UNIT) CAPS capsule TAKE 1 CAPSULE BY MOUTH EVERY 7 DAYS (Patient taking differently: Take 50,000 Units by mouth every Monday.) 4 capsule 0    Labs  Lab Results:  Admission on 07/01/2024, Discharged on 07/01/2024  Component Date Value Ref Range Status   Sodium 07/01/2024 136  135 - 145 mmol/L Final   Potassium 07/01/2024 3.7  3.5 - 5.1 mmol/L Final   Chloride 07/01/2024 98  98 - 111 mmol/L Final   CO2 07/01/2024 24  22 - 32 mmol/L Final   Glucose, Bld 07/01/2024 140 (H)  70 - 99 mg/dL Final   Glucose reference range applies only to samples taken after fasting for at least 8 hours.   BUN 07/01/2024 5 (L)  8 - 23 mg/dL Final   Creatinine, Ser 07/01/2024 0.66  0.44 - 1.00 mg/dL Final   Calcium  07/01/2024 8.8 (L)  8.9 - 10.3 mg/dL Final   Total Protein 87/88/7974 7.1  6.5 - 8.1 g/dL Final   Albumin 87/88/7974 3.9  3.5 - 5.0 g/dL Final   AST 87/88/7974 25  15 - 41 U/L Final   ALT 07/01/2024 19  0 - 44 U/L Final   Alkaline Phosphatase 07/01/2024 58  38 - 126 U/L Final   Total Bilirubin 07/01/2024 1.0  0.0 - 1.2 mg/dL Final   GFR, Estimated 07/01/2024 >60  >60 mL/min Final   Comment: (NOTE) Calculated using the CKD-EPI Creatinine Equation (2021)    Anion gap 07/01/2024 14  5 - 15 Final   Performed at [facility], [address], [place], [place] [address]   Magnesium  07/01/2024 2.3  1.7 - 2.4 mg/dL Final   Performed at [facility], [address], [place], [place] [address]   Phosphorus 07/01/2024 3.7  2.5 - 4.6 mg/dL Final   Performed at [facility], [address], [place], [place] [address]   WBC 07/01/2024 5.4  4.0 - 10.5 K/uL Final    RBC 07/01/2024 4.14  3.87 - 5.11 MIL/uL Final   Hemoglobin 07/01/2024 13.3  12.0 - 15.0 g/dL Final   HCT 87/88/7974 39.8  36.0 - 46.0 % Final   MCV 07/01/2024 96.1  80.0 - 100.0 fL Final   MCH 07/01/2024 32.1  26.0 - 34.0 pg Final   MCHC 07/01/2024 33.4  30.0 - 36.0 g/dL Final   RDW 87/88/7974 14.2  11.5 - 15.5 % Final   Platelets 07/01/2024 292  150 - 400 K/uL Final   nRBC 07/01/2024 0.0  0.0 - 0.2 % Final   Performed at [facility], [address], [place], [place] [address]   POC Amphetamine  UR  07/01/2024 None Detected  NONE DETECTED (Cut Off Level 1000 ng/mL) Final   POC Secobarbital (BAR) 07/01/2024 None Detected  NONE DETECTED (Cut Off Level 300 ng/mL) Final   POC Buprenorphine (BUP) 07/01/2024 None Detected  NONE DETECTED (Cut Off Level 10 ng/mL) Final   POC Oxazepam (BZO) 07/01/2024 None Detected  NONE DETECTED (Cut Off Level 300 ng/mL) Final   POC Cocaine UR 07/01/2024 None Detected  NONE DETECTED (Cut Off Level 300 ng/mL) Final   POC Methamphetamine UR 07/01/2024 None Detected  NONE DETECTED (Cut Off Level 1000 ng/mL) Final   POC Morphine 07/01/2024 None Detected  NONE DETECTED (Cut Off Level 300 ng/mL) Final   POC Methadone UR 07/01/2024 None Detected  NONE DETECTED (Cut Off Level 300 ng/mL) Final   POC Oxycodone UR 07/01/2024 None Detected  NONE DETECTED (Cut Off Level 100 ng/mL) Final   POC Marijuana UR 07/01/2024 None Detected  NONE DETECTED (Cut Off Level 50 ng/mL) Final   Color, Urine 07/01/2024 STRAW (A)  YELLOW Final   APPearance 07/01/2024 CLEAR  CLEAR Final   Specific Gravity, Urine 07/01/2024 1.002 (L)  1.005 - 1.030 Final   pH 07/01/2024 6.0  5.0 - 8.0 Final   Glucose, UA 07/01/2024 NEGATIVE  NEGATIVE mg/dL Final   Hgb urine dipstick 07/01/2024 SMALL (A)  NEGATIVE Final   Bilirubin Urine 07/01/2024 NEGATIVE  NEGATIVE Final   Ketones, ur 07/01/2024 NEGATIVE  NEGATIVE mg/dL Final   Protein, ur 87/88/7974 NEGATIVE  NEGATIVE mg/dL Final   Nitrite 87/88/7974  NEGATIVE  NEGATIVE Final   Leukocytes,Ua 07/01/2024 NEGATIVE  NEGATIVE Final   RBC / HPF 07/01/2024 0-5  0 - 5 RBC/hpf Final   WBC, UA 07/01/2024 0-5  0 - 5 WBC/hpf Final   Bacteria, UA 07/01/2024 FEW (A)  NONE SEEN Final   Squamous Epithelial / HPF 07/01/2024 0-5  0 - 5 /HPF Final   Mucus 07/01/2024 PRESENT   Final   Performed at [facility], [address] [place], [place] [address]   Folate 07/01/2024 13.6  >5.9 ng/mL Final   Performed at [facility], [address] [place], [place] [address]   Vitamin B-12 07/01/2024 177 (L)  180 - 914 pg/mL Final   Comment: (NOTE) This assay is not validated for testing neonatal or myeloproliferative syndrome specimens for Vitamin B12 levels. Performed at [facility], [address], [place], [place] [address]   Scanned Document on 04/28/2024  Component Date Value Ref Range Status   Creatinine, POC 04/09/2024 40.0  mg/dL Final   ABS BY HIM   Creatinine, POC 04/14/2024 63.0  mg/dL Final   ABS BY HIM  Scanned Document on 04/22/2024  Component Date Value Ref Range Status   Creatinine, POC 03/08/2024 34  mg/dL Final   Abstracted by HIM  Office Visit on 02/10/2024  Component Date Value Ref Range Status   Vitamin B-12 02/10/2024 590  211 - 911 pg/mL Final   Folate 02/10/2024 17.8  >5.9 ng/mL Final   Magnesium  02/10/2024 1.8  1.5 - 2.5 mg/dL Final   VITD 92/77/7974 20.50 (L)  30.00 - 100.00 ng/mL Final   Vitamin B1 (Thiamine ) 02/10/2024 16  8 - 30 nmol/L Final   Comment: (Note) Vitamin supplementation within 24 hours prior to blood  draw may affect the accuracy of the results. . This test was developed and its analytical performance  characteristics have been determined by [employer]. It has not been cleared or approved by FDA.  This assay has been validated pursuant to the CLIA  regulations and is used for clinical purposes. . [facility] [address] [place]  [id number] Johanna Agent L. Gino, MD, PhD     Blood Alcohol  level:  Lab Results  Component Value Date   ETH 112 (H) 03/10/2015    Metabolic Disorder Labs: Lab Results  Component Value Date   HGBA1C 4.9 03/05/2022   MPG 82 12/12/2020   No results found for: PROLACTIN Lab Results  Component Value Date   CHOL 189 03/05/2022   TRIG 64.0 03/05/2022   HDL 85.30 03/05/2022   CHOLHDL 2 03/05/2022   VLDL 12.8 03/05/2022   LDLCALC 91 03/05/2022   LDLCALC 78 12/12/2020    Therapeutic Lab Levels: No results found for: LITHIUM No results found for: VALPROATE No results found for: CBMZ  Physical Findings   AIMS    Flowsheet Row [facility]  AIMS Total Score 0   AUDIT    Flowsheet Row [facility]  Alcohol  Use Disorder Identification Test Final Score (AUDIT) 4   GAD-7    Flowsheet Row Office Visit from 11/14/2023 in [facility] [place] Office Visit from 12/10/2022 in [facility] [place] Office Visit from 11/20/2022 in [facility] [place] Office Visit from 09/14/2019 in [facility] [place] Office Visit from 04/22/2019 in [facility]  Total GAD-7 Score 5 0 3 12 7    PHQ2-9    Flowsheet Row ED [facility] 07[facility]01/2024 [facility] from 11/14/2023 in [facility] [place] Office Visit from 12/10/2022 in [facility] [place] Office Visit from 11/20/2022 in [facility] [place] Office Visit from 03/05/2022 in [facility] [place] [facility] [place]  PHQ-2 Total Score 3 2 0 0 0  PHQ-9 Total Score 17 12 0 10 --   Flowsheet Row ED from 07/01/2024 in [facility] Most recent  reading at 07/01/2024  9:45 PM ED from 07/01/2024 in [facility] Most recent reading at 07/01/2024  5:23 PM ED from 08/23/2022 in [facility] Most recent reading at 08/23/2022  6:01 PM  C-SSRS RISK CATEGORY No Risk No Risk No Risk     Musculoskeletal  Strength & Muscle Tone: within normal limits Gait & Station: normal Patient leans: N/A  Psychiatric Specialty Exam  Presentation  General Appearance:  Appropriate for Environment  Eye Contact: Good  Speech: Clear and Coherent  Speech Volume: Normal  Handedness: Right   Mood and Affect  Mood: Anxious; Irritable  Affect: Congruent   Thought Process  Thought Processes: Coherent; Linear  Descriptions of Associations:Intact  Orientation:Full (Time, Place and Person)  Thought Content:WDL; Logical  Diagnosis of Schizophrenia or Schizoaffective disorder in past: Yes    Hallucinations:Hallucinations: None  Ideas of Reference:None  Suicidal Thoughts:Suicidal Thoughts: No  Homicidal Thoughts:Homicidal Thoughts: No   Sensorium  Memory: Immediate Good  Judgment: Poor  Insight: Poor   Executive Functions  Concentration: Good  Attention Span: Good  Recall: Good  Fund of Knowledge: Fair  Language: Good   Psychomotor Activity  Psychomotor Activity: Psychomotor Activity: Tremor   Assets  Assets: Communication Skills; Desire  for Improvement; Financial Resources/Insurance; Social Support; Resilience   Sleep  Sleep: Sleep: Good  Estimated Sleeping Duration (Last 24 Hours): 9.25-12.00 hours  No data recorded  Physical Exam  Physical Exam Vitals and nursing note reviewed.  HENT:     Right Ear: Decreased hearing noted. Tympanic membrane is erythematous.     Left Ear: Hearing and tympanic membrane normal.     Nose: Nose normal.  Cardiovascular:     Rate and Rhythm: Normal rate.  Pulmonary:     Effort: Pulmonary  effort is normal.  Musculoskeletal:        General: Normal range of motion.     Cervical back: Normal range of motion.  Neurological:     Mental Status: She is alert and oriented to person, place, and time.  Psychiatric:        Attention and Perception: Attention normal. She does not perceive auditory or visual hallucinations.        Mood and Affect: Affect normal. Mood is anxious.        Speech: Speech normal.        Behavior: Behavior normal. Behavior is cooperative.        Thought Content: Thought content normal. Thought content is not paranoid or delusional. Thought content does not include homicidal or suicidal ideation. Thought content does not include homicidal or suicidal plan.        Cognition and Memory: Cognition normal.        Judgment: Judgment normal.    Review of Systems  Constitutional: Negative.   HENT:  Positive for ear pain.   Eyes: Negative.   Respiratory: Negative.    Cardiovascular: Negative.   Gastrointestinal: Negative.   Genitourinary: Negative.   Musculoskeletal: Negative.   Skin: Negative.   Neurological: Negative.   Endo/Heme/Allergies: Negative.   Psychiatric/Behavioral:  Positive for substance abuse. Negative for depression, hallucinations and suicidal ideas. The patient is nervous/anxious. The patient does not have insomnia.    Blood pressure (!) 170/100, pulse 97, temperature 98 F (36.7 C), temperature source Oral, resp. rate 18, last menstrual period 04/18/2019, SpO2 100%. There is no height or weight on file to calculate BMI.   Demographic Factors:  Caucasian, Low socioeconomic status, and Unemployed  Loss Factors: Legal issues and Financial problems/change in socioeconomic status  Historical Factors: NA  Risk Reduction Factors:   Sense of responsibility to family, Living with another person, especially a relative, Positive social support, Positive therapeutic relationship, and Positive coping skills or problem solving skills  Continued  Clinical Symptoms:  More than one psychiatric diagnosis Previous Psychiatric Diagnoses and Treatments  Cognitive Features That Contribute To Risk:  None    Suicide Risk:  Minimal: No identifiable suicidal ideation.  Patients presenting with no risk factors but with morbid ruminations; may be classified as minimal risk based on the severity of the depressive symptoms  Plan Of Care/Follow-up recommendations:  Disposition: Discharge home to son, Leah Jones. Patient to follow-up with outpatient psychiatric services for medication management and counseling services. Patient provided with resources for [facility] and encouraged to follow-up with [facility] for her appointment on 07/07/2024 at 9 am. Also provided resources for substance use treatment/rehab programs. She is not interested in rehab programs at this time.   Patient does not meet inpatient psychiatric admission criteria or IVC criteria at this time. There is no evidence of imminent risk of harm to self or others.  Please refrain from using alcohol  or illicit substances, as they can  affect your mood and can cause depression, anxiety or other concerning symptoms.  Alcohol  can increase the chance that a person will make reckless decisions, like attempting suicide, and can increase the lethality of a drug overdose.    Discussed crisis plan, calling 911, going to the ED if condition changes or worsens.  Patient verbalizes understanding.  Labs were reviewed with the patient, and abnormal results were discussed with the patient. The patient is able to verbalize their individual safety plan to this provider. It is recommended to the patient to continue psychiatric medications as prescribed, after discharge from the [facility]. It is recommended to the patient to follow up with outpatient psychiatric provider and PCP. It was discussed with the patient, the impact of alcohol , drugs, tobacco have been there overall  psychiatric and medical wellbeing, and total abstinence from substance use was recommended the patient.  Prescriptions sent directly to preferred pharmacy at discharge. Patient agreeable to plan. Given opportunity to ask questions.   Japneet Staggs, NP 07/04/2024 15:34 PM

## 2024-07-07 ENCOUNTER — Encounter (HOSPITAL_COMMUNITY): Payer: Self-pay | Admitting: Licensed Clinical Social Worker

## 2024-07-07 ENCOUNTER — Ambulatory Visit (HOSPITAL_COMMUNITY)

## 2024-07-12 ENCOUNTER — Encounter: Payer: Self-pay | Admitting: Family Medicine

## 2024-07-12 ENCOUNTER — Telehealth: Payer: Self-pay

## 2024-07-12 ENCOUNTER — Ambulatory Visit (INDEPENDENT_AMBULATORY_CARE_PROVIDER_SITE_OTHER): Admitting: Family Medicine

## 2024-07-12 VITALS — BP 128/86 | HR 100 | Temp 97.7°F | Ht 66.0 in | Wt 126.2 lb

## 2024-07-12 DIAGNOSIS — G5793 Unspecified mononeuropathy of bilateral lower limbs: Secondary | ICD-10-CM

## 2024-07-12 DIAGNOSIS — Z7689 Persons encountering health services in other specified circumstances: Secondary | ICD-10-CM | POA: Diagnosis not present

## 2024-07-12 DIAGNOSIS — Z780 Asymptomatic menopausal state: Secondary | ICD-10-CM | POA: Diagnosis not present

## 2024-07-12 DIAGNOSIS — E559 Vitamin D deficiency, unspecified: Secondary | ICD-10-CM | POA: Insufficient documentation

## 2024-07-12 DIAGNOSIS — Z136 Encounter for screening for cardiovascular disorders: Secondary | ICD-10-CM

## 2024-07-12 DIAGNOSIS — F10288 Alcohol dependence with other alcohol-induced disorder: Secondary | ICD-10-CM | POA: Diagnosis not present

## 2024-07-12 DIAGNOSIS — Z13220 Encounter for screening for lipoid disorders: Secondary | ICD-10-CM | POA: Diagnosis not present

## 2024-07-12 DIAGNOSIS — F909 Attention-deficit hyperactivity disorder, unspecified type: Secondary | ICD-10-CM | POA: Insufficient documentation

## 2024-07-12 DIAGNOSIS — E538 Deficiency of other specified B group vitamins: Secondary | ICD-10-CM | POA: Diagnosis not present

## 2024-07-12 DIAGNOSIS — R7302 Impaired glucose tolerance (oral): Secondary | ICD-10-CM | POA: Insufficient documentation

## 2024-07-12 DIAGNOSIS — I10 Essential (primary) hypertension: Secondary | ICD-10-CM | POA: Diagnosis not present

## 2024-07-12 DIAGNOSIS — Z13228 Encounter for screening for other metabolic disorders: Secondary | ICD-10-CM | POA: Insufficient documentation

## 2024-07-12 MED ORDER — ATOMOXETINE HCL 40 MG PO CAPS: 40.0000 mg | ORAL_CAPSULE | Freq: Every morning | ORAL | 1 refills | Status: AC

## 2024-07-12 MED ORDER — AMLODIPINE BESYLATE 5 MG PO TABS: 5.0000 mg | ORAL_TABLET | Freq: Every day | ORAL | 1 refills | Status: AC

## 2024-07-12 MED ORDER — BACLOFEN 10 MG PO TABS: 10.0000 mg | ORAL_TABLET | Freq: Three times a day (TID) | ORAL | 1 refills | Status: AC

## 2024-07-12 MED ORDER — ESTRADIOL 1 MG PO TABS: 1.0000 mg | ORAL_TABLET | Freq: Every day | ORAL | 0 refills | Status: AC

## 2024-07-12 MED ORDER — MEDROXYPROGESTERONE ACETATE 2.5 MG PO TABS: 2.5000 mg | ORAL_TABLET | Freq: Every day | ORAL | 0 refills | Status: AC

## 2024-07-12 MED ORDER — GABAPENTIN 300 MG PO CAPS: 300.0000 mg | ORAL_CAPSULE | Freq: Three times a day (TID) | ORAL | 2 refills | Status: AC

## 2024-07-12 NOTE — Assessment & Plan Note (Signed)
 Glucose elevated on recent labs. A1C today.

## 2024-07-12 NOTE — Assessment & Plan Note (Signed)
 Recheck today. Medications based on results.

## 2024-07-12 NOTE — Assessment & Plan Note (Addendum)
 Baclofen  10 mg TID per Behavioral health provider. Gabapentin  300 mg TID for alcohol  cravings/dependence per behavioral health provider.  PDMP reviewed: no red flags.  Refills sent.

## 2024-07-12 NOTE — Assessment & Plan Note (Addendum)
 Gabapentin  300 mg TID. Symptoms are controlled. PDMP: 05/17/24: # 90: no red flags Refill sent.  Follow-up in 3 months.

## 2024-07-12 NOTE — Assessment & Plan Note (Signed)
 Strattera  40 mg daily.  Does not feel different with this, thinks it has helped some. Focus is better since not drinking.  Understands that drinking alcohol  while taking this is contraindicated.  Attending AA meetings. Eleven days since taking a drink. Congratulations!  Refills sent.

## 2024-07-12 NOTE — Assessment & Plan Note (Signed)
 Prescribed amlodipine  2.5 mg daily at [facility].  Blood pressure not controlled on this dose.  Denies chest pain and shortness of breath. Does not monitor blood pressure at home. Increase dose to 5 mg today. DASH diet. Moderate exercise.  Monitor blood pressure at home.  Goal 130/80 or less.  Follow-up in 3 months.

## 2024-07-12 NOTE — Assessment & Plan Note (Signed)
 07/01/24: folate normal. Recheck today.

## 2024-07-12 NOTE — Telephone Encounter (Signed)
 Copied from CRM #[id number]. Topic: Appointments - Scheduling Inquiry for Clinic >> Jul 09, 2024 12:35 PM Mercedes MATSU wrote: Reason for CRM: Patient is requesting a call back to be scheduled. I attempted to scheduled patient but it stated that she has been discharged from all practices. Patient is requesting a call back to be scheduled and to be informed why she was discharged from practice. Patient can be reached at [phone].

## 2024-07-12 NOTE — Telephone Encounter (Signed)
 LVM for pt to call back to discuss. Pt was dismiss back in October for multiple no show appointments. Letter was mailed to pt as well

## 2024-07-12 NOTE — Assessment & Plan Note (Signed)
 Estradiol  1 mg daily Provera  2.5 mg daily Refills sent. She is actively looking for another GYN provider as her previous one left the practice.

## 2024-07-12 NOTE — Progress Notes (Signed)
 "  New Patient Office Visit  Subjective    Patient ID: Leah Jones, female    DOB: [birth year]  Age: 62 y.o. MRN: [id number]  CC:  Chief Complaint  Patient presents with   Establish Care    HPI Leah Jones presents to establish care with this practice. She is new to me.  HTN: amlodipine  2.5 mg daily. Blood pressure not controlled on this dose.  Denies chest pain and shortness of breath. Does not monitor blood pressure at home. Increase dose to 5 mg today. DASH diet. Moderate exercise.  Monitor blood pressure at home.  Goal 130/80 or less.   Alcohol  detox/neuropathy in feet: Gabapentin  300 mg TID. Folic acid  1 mg daily.  Tremors: propranolol  10 mg BID given by [facility] while in their program. Has not had this for 1-2 months.  Completed 2 weeks ago the [facility] outpatient program. Now with AA for support.   ADHD: Strattera  40 mg daily.  Does not feel different with this, thinks it has helped some. Focus is better since not drinking.  Refills sent.   Alcohol  cravings: Baclofen  10 mg TID  Vitamin D  deficiency: needs lab checked today.   B 12 low: got injection while in [facility]. Recheck today.     Outpatient Encounter Medications as of 07/12/2024  Medication Sig   amLODipine  (NORVASC ) 5 MG tablet Take 1 tablet (5 mg total) by mouth daily.   cetirizine  (ZYRTEC ) 10 MG tablet Take 1 tablet (10 mg total) by mouth at bedtime.   EPINEPHrine  0.3 mg/0.3 mL IJ SOAJ injection Inject 0.3 mg into the muscle as needed for anaphylaxis.   folic acid  (FOLVITE ) 1 MG tablet TAKE 1 TABLET BY MOUTH DAILY   MAGNESIUM  PO Take 1 tablet by mouth daily.   Multiple Vitamin (MULTIVITAMIN WITH MINERALS) TABS tablet Take 1 tablet by mouth daily.   propranolol  (INDERAL ) 10 MG tablet Take 10 mg by mouth 2 (two) times daily.   thiamine  (VITAMIN B1) 100 MG tablet Take 100 mg by mouth daily.   [DISCONTINUED] amLODipine  (NORVASC ) 2.5 MG tablet Take 1 tablet (2.5 mg total) by mouth daily.    [DISCONTINUED] atomoxetine  (STRATTERA ) 40 MG capsule Take 40 mg by mouth every morning.   [DISCONTINUED] baclofen  (LIORESAL ) 10 MG tablet Take 1 tablet (10 mg total) by mouth 3 (three) times daily.   [DISCONTINUED] estradiol  (ESTRACE ) 1 MG tablet Take 1 mg by mouth daily.   [DISCONTINUED] gabapentin  (NEURONTIN ) 300 MG capsule Take 1 capsule (300 mg total) by mouth 3 (three) times daily.   [DISCONTINUED] medroxyPROGESTERone  (PROVERA ) 2.5 MG tablet Take 2.5 mg by mouth daily.   atomoxetine  (STRATTERA ) 40 MG capsule Take 1 capsule (40 mg total) by mouth every morning.   baclofen  (LIORESAL ) 10 MG tablet Take 1 tablet (10 mg total) by mouth 3 (three) times daily.   estradiol  (ESTRACE ) 1 MG tablet Take 1 tablet (1 mg total) by mouth daily.   gabapentin  (NEURONTIN ) 300 MG capsule Take 1 capsule (300 mg total) by mouth 3 (three) times daily.   medroxyPROGESTERone  (PROVERA ) 2.5 MG tablet Take 1 tablet (2.5 mg total) by mouth daily.   Vitamin D , Ergocalciferol , (DRISDOL ) 1.25 MG (50000 UNIT) CAPS capsule TAKE 1 CAPSULE BY MOUTH EVERY 7 DAYS (Patient not taking: Reported on 07/12/2024)   No facility-administered encounter medications on file as of 07/12/2024.    Past Medical History:  Diagnosis Date   ADD (attention deficit disorder)    ? alcohol  related   Alcohol  dependence, daily use (HCC)  11/17/2018   Alcoholism (HCC)    per record   Allergic reaction 09/24/2022   ANAL FISSURE, HX OF 04/15/2007   Annotation: occasional rectal bleeding Qualifier: Diagnosis of  By: Amon MD, Aloysius BRAVO.    Angio-edema    B12 deficiency    Carpal tunnel syndrome    Carpal tunnel syndrome 12/01/2009   Qualifier: Diagnosis of  By: Amon MD, Jose E.    COVID-19 virus infection 08/23/2019   Diarrhea 11/17/2018   Discoloration of skin of foot 05/20/2014   Purplish discoloration top of L foot    Eczema    Eustachian tube dysfunction 01/25/2014   Mallet deformity of fifth finger, left, acquired 06/19/2015   Suicidal  ideation    Urticaria     Past Surgical History:  Procedure Laterality Date   COLPOSCOPY  2020   pt reported benign.    FOOT SURGERY Left    Morton's neuroma, Dr Magdalen   PROBING OF NASOLACRIMAL DUCT, SILICONE TUBE, WITH OR WITHOUT IRRIGATION; WITH INSERTION OF TUBE OR STENT  09/24/2023   RHINOPLASTY     RIGHT OLECRANON REVISION OPEN REDUCTION INTERNAL FIXATION, CASE APPLICATION   01/2024   TONSILLECTOMY      Family History  Problem Relation Age of Onset   Stomach cancer Mother    Colon polyps Mother    Asthma Mother    Colon cancer Mother    Breast cancer Mother    Celiac disease Mother    Colon polyps Father    Alcoholism Father    Colon polyps Sister    Asthma Sister    Anxiety disorder Sister    ADD / ADHD Sister    Colon polyps Brother    Bipolar disorder Brother    Pancreatic cancer Brother    Lung cancer Paternal Grandmother    Pancreatic cancer Paternal Grandfather    Asthma Daughter    Asthma Son    Eczema Son    Bipolar disorder Son    Diabetes Other    Hypertension Other    Esophageal cancer Neg Hx    Rectal cancer Neg Hx     Social History   Socioeconomic History   Marital status: Divorced    Spouse name: Not on file   Number of children: 3   Years of education: Not on file   Highest education level: Not on file  Occupational History   Occupation: Public Affairs Consultant: [employer]  Tobacco Use   Smoking status: Some Days    Current packs/day: 0.50    Types: Cigarettes   Smokeless tobacco: Never  Vaping Use   Vaping status: Never Used  Substance and Sexual Activity   Alcohol  use: Yes    Comment: Drinking 6 days a week. Drinks a bottle of wine, beer, or liquor    Drug use: No   Sexual activity: Not on file  Other Topics Concern   Not on file  Social History Narrative   G5 P3, 3 miscarriages   Social Drivers of Health   Tobacco Use: High Risk (07/12/2024)   Patient History    Smoking Tobacco Use: Some Days     Smokeless Tobacco Use: Never    Passive Exposure: Not on file  Financial Resource Strain: Not on file  Food Insecurity: No Food Insecurity (07/01/2024)   Epic    Worried About Programme Researcher, Broadcasting/film/video in the Last Year: Never true    [employer] of Food in the Last Year:  Never true  Transportation Needs: No Transportation Needs (07/01/2024)   Epic    Lack of Transportation (Medical): No    Lack of Transportation (Non-Medical): No  Physical Activity: Not on file  Stress: Not on file  Social Connections: Not on file  Intimate Partner Violence: Not At Risk (07/01/2024)   Epic    Fear of Current or Ex-Partner: No    Emotionally Abused: No    Physically Abused: No    Sexually Abused: No  Depression (PHQ2-9): Medium Risk (07/12/2024)   Depression (PHQ2-9)    PHQ-2 Score: 5  Alcohol  Screen: Not on file  Housing: Low Risk (01/29/2024)   Received from [facility]   Epic    What is your living situation today?: I have a steady place to live    Think about the place you live. Do you have problems with any of the following? Choose all that apply:: None/None on this list  Utilities: Not At Risk (07/01/2024)   Epic    Threatened with loss of utilities: No  Health Literacy: Not on file    ROS      Objective    BP 128/86 (Patient Position: Sitting, Cuff Size: Normal)   Pulse 100   Temp 97.7 F (36.5 C) (Oral)   Ht 5' 6 (1.676 m)   Wt 126 lb 3.2 oz (57.2 kg)   LMP 04/18/2019   SpO2 100%   BMI 20.37 kg/m   Physical Exam Vitals and nursing note reviewed.  Constitutional:      General: She is not in acute distress.    Appearance: Normal appearance.  Cardiovascular:     Rate and Rhythm: Normal rate and regular rhythm.     Heart sounds: Normal heart sounds.  Pulmonary:     Effort: Pulmonary effort is normal.     Breath sounds: Normal breath sounds.  Skin:    General: Skin is warm and dry.  Neurological:     General: No focal deficit present.     Mental Status: She is alert.  Mental status is at baseline.  Psychiatric:        Mood and Affect: Mood normal.        Behavior: Behavior normal.        Thought Content: Thought content normal.        Judgment: Judgment normal.         Assessment & Plan:   Establishing care with new doctor, encounter for  Primary hypertension Assessment & Plan: Prescribed amlodipine  2.5 mg daily at [facility].  Blood pressure not controlled on this dose.  Denies chest pain and shortness of breath. Does not monitor blood pressure at home. Increase dose to 5 mg today. DASH diet. Moderate exercise.  Monitor blood pressure at home.  Goal 130/80 or less.  Follow-up in 3 months.    Orders: -     Comprehensive metabolic panel with GFR -     amLODIPine  Besylate; Take 1 tablet (5 mg total) by mouth daily.  Dispense: 90 tablet; Refill: 1  B12 deficiency Assessment & Plan: 07/01/24: folate normal. Recheck today.    Orders: -     B12 and Folate Panel  Impaired glucose tolerance Assessment & Plan: Glucose elevated on recent labs. A1C today.   Orders: -     Comprehensive metabolic panel with GFR -     Hemoglobin A1c  Vitamin D  deficiency Assessment & Plan: Recheck today. Medications based on results.   Orders: -     VITAMIN  D 25 Hydroxy (Vit-D Deficiency, Fractures)  Encounter for lipid screening for cardiovascular disease -     Lipid panel  Encounter for screening for metabolic disorder -     Comprehensive metabolic panel with GFR -     Hemoglobin A1c -     TSH + free T4  Attention deficit hyperactivity disorder (ADHD), unspecified ADHD type Assessment & Plan: Strattera  40 mg daily.  Does not feel different with this, thinks it has helped some. Focus is better since not drinking.  Understands that drinking alcohol  while taking this is contraindicated.  Attending AA meetings. Eleven days since taking a drink. Congratulations!  Refills sent.   Orders: -     Atomoxetine  HCl; Take 1 capsule (40 mg total) by  mouth every morning.  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 Gabapentin  300 mg TID for alcohol  cravings/dependence per behavioral health provider.  PDMP reviewed: no red flags.  Refills sent.    Orders: -     Baclofen ; Take 1 tablet (10 mg total) by mouth 3 (three) times daily.  Dispense: 90 tablet; Refill: 1 -     Gabapentin ; Take 1 capsule (300 mg total) by mouth 3 (three) times daily.  Dispense: 180 capsule; Refill: 2  Post-menopausal Assessment & Plan: Estradiol  1 mg daily Provera  2.5 mg daily Refills sent. She is actively looking for another GYN provider as her previous one left the practice.    Orders: -     Estradiol ; Take 1 tablet (1 mg total) by mouth daily.  Dispense: 90 tablet; Refill: 0 -     medroxyPROGESTERone  Acetate; Take 1 tablet (2.5 mg total) by mouth daily.  Dispense: 90 tablet; Refill: 0  Neuropathy of both feet Assessment & Plan: Gabapentin  300 mg TID. Symptoms are controlled. PDMP: 05/17/24: # 90: no red flags Refill sent.  Follow-up in 3 months.   Orders: -     Gabapentin ; Take 1 capsule (300 mg total) by mouth 3 (three) times daily.  Dispense: 180 capsule; Refill: 2    Agrees with plan of care discussed.  Questions answered.   Return in about 3 months (around 10/10/2024) for chronic conditions .   Leah JONELLE Brownie, FNP   "

## 2024-07-13 ENCOUNTER — Ambulatory Visit: Payer: Self-pay | Admitting: Family Medicine

## 2024-07-13 LAB — LIPID PANEL
Chol/HDL Ratio: 3.5 ratio (ref 0.0–4.4)
Cholesterol, Total: 199 mg/dL (ref 100–199)
HDL: 57 mg/dL
LDL Chol Calc (NIH): 126 mg/dL — ABNORMAL HIGH (ref 0–99)
Triglycerides: 91 mg/dL (ref 0–149)
VLDL Cholesterol Cal: 16 mg/dL (ref 5–40)

## 2024-07-13 LAB — COMPREHENSIVE METABOLIC PANEL WITH GFR
ALT: 24 IU/L (ref 0–32)
AST: 25 IU/L (ref 0–40)
Albumin: 4.5 g/dL (ref 3.9–4.9)
Alkaline Phosphatase: 61 IU/L (ref 49–135)
BUN/Creatinine Ratio: 7 — ABNORMAL LOW (ref 12–28)
BUN: 5 mg/dL — ABNORMAL LOW (ref 8–27)
Bilirubin Total: 0.3 mg/dL (ref 0.0–1.2)
CO2: 22 mmol/L (ref 20–29)
Calcium: 9.8 mg/dL (ref 8.7–10.3)
Chloride: 102 mmol/L (ref 96–106)
Creatinine, Ser: 0.68 mg/dL (ref 0.57–1.00)
Globulin, Total: 2.1 g/dL (ref 1.5–4.5)
Glucose: 89 mg/dL (ref 70–99)
Potassium: 4.3 mmol/L (ref 3.5–5.2)
Sodium: 139 mmol/L (ref 134–144)
Total Protein: 6.6 g/dL (ref 6.0–8.5)
eGFR: 99 mL/min/1.73

## 2024-07-13 LAB — VITAMIN D 25 HYDROXY (VIT D DEFICIENCY, FRACTURES): Vit D, 25-Hydroxy: 41.2 ng/mL (ref 30.0–100.0)

## 2024-07-13 LAB — TSH+FREE T4
Free T4: 1.24 ng/dL (ref 0.82–1.77)
TSH: 1.79 u[IU]/mL (ref 0.450–4.500)

## 2024-07-13 LAB — HEMOGLOBIN A1C
Est. average glucose Bld gHb Est-mCnc: 91 mg/dL
Hgb A1c MFr Bld: 4.8 % (ref 4.8–5.6)

## 2024-07-13 LAB — B12 AND FOLATE PANEL
Folate: >20 ng/mL
Vitamin B-12: >2000 pg/mL — ABNORMAL HIGH (ref 232–1245)

## 2024-07-26 ENCOUNTER — Other Ambulatory Visit: Payer: Self-pay | Admitting: Family Medicine

## 2024-07-26 NOTE — Telephone Encounter (Signed)
 Copied from CRM [id number]. Topic: Clinical - Medication Refill >> Jul 26, 2024  2:12 PM Wess RAMAN wrote: Medication: cetirizine  (ZYRTEC ) 10 MG tablet   Has the patient contacted their pharmacy? No (Agent: If no, request that the patient contact the pharmacy for the refill. If patient does not wish to contact the pharmacy document the reason why and proceed with request.) (Agent: If yes, when and what did the pharmacy advise?)  This is the patient's preferred pharmacy:  [facility] [id number] - [place], [place] - [address] [place] [address] Phone: [phone] Fax: [phone]  Is this the correct pharmacy for this prescription? Yes If no, delete pharmacy and type the correct one.   Has the prescription been filled recently? Yes  Is the patient out of the medication? Yes  Has the patient been seen for an appointment in the last year OR does the patient have an upcoming appointment? Yes  Can we respond through MyChart? Yes  Agent: Please be advised that Rx refills may take up to 3 business days. We ask that you follow-up with your pharmacy.

## 2024-07-27 MED ORDER — CETIRIZINE HCL 10 MG PO TABS: 10.0000 mg | ORAL_TABLET | Freq: Every day | ORAL | 3 refills | Status: AC
